# Patient Record
Sex: Female | Born: 1970 | Race: Black or African American | Hispanic: No | Marital: Single | Smoking: Current every day smoker
Health system: Southern US, Community
[De-identification: ages and names within clinical notes are randomized; demographics above are authoritative.]

## PROBLEM LIST (undated history)

## (undated) DIAGNOSIS — Q760 Spina bifida occulta: Secondary | ICD-10-CM

## (undated) DIAGNOSIS — K2970 Gastritis, unspecified, without bleeding: Secondary | ICD-10-CM

## (undated) DIAGNOSIS — K589 Irritable bowel syndrome without diarrhea: Secondary | ICD-10-CM

## (undated) DIAGNOSIS — G43909 Migraine, unspecified, not intractable, without status migrainosus: Secondary | ICD-10-CM

## (undated) DIAGNOSIS — B9681 Helicobacter pylori [H. pylori] as the cause of diseases classified elsewhere: Secondary | ICD-10-CM

## (undated) DIAGNOSIS — Z9889 Other specified postprocedural states: Secondary | ICD-10-CM

## (undated) DIAGNOSIS — K219 Gastro-esophageal reflux disease without esophagitis: Secondary | ICD-10-CM

## (undated) HISTORY — DX: Spina bifida occulta: Q76.0

## (undated) HISTORY — DX: Migraine, unspecified, not intractable, without status migrainosus: G43.909

## (undated) HISTORY — PX: ENDOMETRIAL ABLATION: SHX621

## (undated) HISTORY — DX: Gastritis, unspecified, without bleeding: K29.70

## (undated) HISTORY — PX: DILATION AND CURETTAGE OF UTERUS: SHX78

## (undated) HISTORY — PX: TUBAL LIGATION: SHX77

## (undated) HISTORY — PX: HEMORRHOID BANDING: SHX5850

## (undated) HISTORY — DX: Irritable bowel syndrome, unspecified: K58.9

## (undated) HISTORY — DX: Other specified postprocedural states: Z98.890

## (undated) HISTORY — DX: Helicobacter pylori (H. pylori) as the cause of diseases classified elsewhere: B96.81

## (undated) HISTORY — DX: Gastro-esophageal reflux disease without esophagitis: K21.9

## (undated) HISTORY — PX: CHOLECYSTECTOMY: SHX55

---

## 2001-05-25 ENCOUNTER — Encounter: Payer: Self-pay | Admitting: Family Medicine

## 2001-05-25 ENCOUNTER — Ambulatory Visit (HOSPITAL_COMMUNITY): Admission: RE | Admit: 2001-05-25 | Discharge: 2001-05-25 | Payer: Self-pay | Admitting: Family Medicine

## 2001-06-02 ENCOUNTER — Other Ambulatory Visit: Admission: RE | Admit: 2001-06-02 | Discharge: 2001-06-02 | Payer: Self-pay | Admitting: Obstetrics and Gynecology

## 2001-06-12 ENCOUNTER — Emergency Department (HOSPITAL_COMMUNITY): Admission: EM | Admit: 2001-06-12 | Discharge: 2001-06-12 | Payer: Self-pay | Admitting: Emergency Medicine

## 2001-10-16 ENCOUNTER — Emergency Department (HOSPITAL_COMMUNITY): Admission: EM | Admit: 2001-10-16 | Discharge: 2001-10-16 | Payer: Self-pay | Admitting: Emergency Medicine

## 2002-02-06 ENCOUNTER — Emergency Department (HOSPITAL_COMMUNITY): Admission: EM | Admit: 2002-02-06 | Discharge: 2002-02-06 | Payer: Self-pay | Admitting: Emergency Medicine

## 2002-03-23 ENCOUNTER — Emergency Department (HOSPITAL_COMMUNITY): Admission: EM | Admit: 2002-03-23 | Discharge: 2002-03-23 | Payer: Self-pay | Admitting: Emergency Medicine

## 2002-10-11 ENCOUNTER — Emergency Department (HOSPITAL_COMMUNITY): Admission: EM | Admit: 2002-10-11 | Discharge: 2002-10-11 | Payer: Self-pay | Admitting: Emergency Medicine

## 2003-02-10 ENCOUNTER — Emergency Department (HOSPITAL_COMMUNITY): Admission: EM | Admit: 2003-02-10 | Discharge: 2003-02-10 | Payer: Self-pay | Admitting: Internal Medicine

## 2003-02-15 ENCOUNTER — Emergency Department (HOSPITAL_COMMUNITY): Admission: EM | Admit: 2003-02-15 | Discharge: 2003-02-16 | Payer: Self-pay | Admitting: *Deleted

## 2003-02-16 ENCOUNTER — Encounter: Payer: Self-pay | Admitting: *Deleted

## 2005-03-18 ENCOUNTER — Emergency Department (HOSPITAL_COMMUNITY): Admission: EM | Admit: 2005-03-18 | Discharge: 2005-03-18 | Payer: Self-pay | Admitting: Emergency Medicine

## 2005-12-02 ENCOUNTER — Ambulatory Visit (HOSPITAL_COMMUNITY): Admission: RE | Admit: 2005-12-02 | Discharge: 2005-12-02 | Payer: Self-pay | Admitting: General Surgery

## 2006-01-07 ENCOUNTER — Emergency Department (HOSPITAL_COMMUNITY): Admission: EM | Admit: 2006-01-07 | Discharge: 2006-01-07 | Payer: Self-pay | Admitting: Emergency Medicine

## 2006-06-09 ENCOUNTER — Emergency Department (HOSPITAL_COMMUNITY): Admission: EM | Admit: 2006-06-09 | Discharge: 2006-06-09 | Payer: Self-pay | Admitting: Emergency Medicine

## 2006-08-03 ENCOUNTER — Emergency Department (HOSPITAL_COMMUNITY): Admission: EM | Admit: 2006-08-03 | Discharge: 2006-08-03 | Payer: Self-pay | Admitting: Emergency Medicine

## 2006-10-14 ENCOUNTER — Emergency Department (HOSPITAL_COMMUNITY): Admission: EM | Admit: 2006-10-14 | Discharge: 2006-10-15 | Payer: Self-pay | Admitting: Emergency Medicine

## 2007-01-18 ENCOUNTER — Ambulatory Visit (HOSPITAL_COMMUNITY): Admission: RE | Admit: 2007-01-18 | Discharge: 2007-01-18 | Payer: Self-pay | Admitting: Obstetrics & Gynecology

## 2007-01-18 ENCOUNTER — Encounter: Payer: Self-pay | Admitting: Obstetrics & Gynecology

## 2007-02-21 ENCOUNTER — Emergency Department (HOSPITAL_COMMUNITY): Admission: EM | Admit: 2007-02-21 | Discharge: 2007-02-21 | Payer: Self-pay | Admitting: Emergency Medicine

## 2007-05-12 ENCOUNTER — Emergency Department (HOSPITAL_COMMUNITY): Admission: EM | Admit: 2007-05-12 | Discharge: 2007-05-12 | Payer: Self-pay | Admitting: Emergency Medicine

## 2009-03-15 ENCOUNTER — Emergency Department (HOSPITAL_COMMUNITY): Admission: EM | Admit: 2009-03-15 | Discharge: 2009-03-15 | Payer: Self-pay | Admitting: Emergency Medicine

## 2009-03-25 ENCOUNTER — Emergency Department (HOSPITAL_COMMUNITY): Admission: EM | Admit: 2009-03-25 | Discharge: 2009-03-25 | Payer: Self-pay | Admitting: Emergency Medicine

## 2009-07-04 ENCOUNTER — Emergency Department (HOSPITAL_COMMUNITY): Admission: EM | Admit: 2009-07-04 | Discharge: 2009-07-04 | Payer: Self-pay | Admitting: Emergency Medicine

## 2009-07-21 ENCOUNTER — Other Ambulatory Visit: Admission: RE | Admit: 2009-07-21 | Discharge: 2009-07-21 | Payer: Self-pay | Admitting: Obstetrics & Gynecology

## 2009-07-22 ENCOUNTER — Emergency Department (HOSPITAL_COMMUNITY): Admission: EM | Admit: 2009-07-22 | Discharge: 2009-07-22 | Payer: Self-pay | Admitting: Emergency Medicine

## 2009-08-02 DIAGNOSIS — B9681 Helicobacter pylori [H. pylori] as the cause of diseases classified elsewhere: Secondary | ICD-10-CM

## 2009-08-02 HISTORY — DX: Helicobacter pylori (H. pylori) as the cause of diseases classified elsewhere: B96.81

## 2009-09-10 ENCOUNTER — Ambulatory Visit: Payer: Self-pay | Admitting: Internal Medicine

## 2009-09-10 DIAGNOSIS — R1013 Epigastric pain: Secondary | ICD-10-CM | POA: Insufficient documentation

## 2009-09-10 DIAGNOSIS — K219 Gastro-esophageal reflux disease without esophagitis: Secondary | ICD-10-CM

## 2009-09-10 DIAGNOSIS — K625 Hemorrhage of anus and rectum: Secondary | ICD-10-CM

## 2009-09-10 DIAGNOSIS — Z91038 Other insect allergy status: Secondary | ICD-10-CM | POA: Insufficient documentation

## 2009-09-10 DIAGNOSIS — Z862 Personal history of diseases of the blood and blood-forming organs and certain disorders involving the immune mechanism: Secondary | ICD-10-CM

## 2009-09-10 DIAGNOSIS — K5900 Constipation, unspecified: Secondary | ICD-10-CM | POA: Insufficient documentation

## 2009-09-30 DIAGNOSIS — Z9889 Other specified postprocedural states: Secondary | ICD-10-CM

## 2009-09-30 HISTORY — PX: OTHER SURGICAL HISTORY: SHX169

## 2009-09-30 HISTORY — DX: Other specified postprocedural states: Z98.890

## 2009-10-06 ENCOUNTER — Encounter: Payer: Self-pay | Admitting: Urgent Care

## 2009-10-07 ENCOUNTER — Ambulatory Visit (HOSPITAL_COMMUNITY): Admission: RE | Admit: 2009-10-07 | Discharge: 2009-10-07 | Payer: Self-pay | Admitting: Internal Medicine

## 2009-10-07 ENCOUNTER — Ambulatory Visit: Payer: Self-pay | Admitting: Internal Medicine

## 2009-10-10 ENCOUNTER — Encounter: Payer: Self-pay | Admitting: Gastroenterology

## 2009-10-13 ENCOUNTER — Encounter (INDEPENDENT_AMBULATORY_CARE_PROVIDER_SITE_OTHER): Payer: Self-pay

## 2009-10-30 ENCOUNTER — Telehealth (INDEPENDENT_AMBULATORY_CARE_PROVIDER_SITE_OTHER): Payer: Self-pay

## 2009-12-15 ENCOUNTER — Telehealth (INDEPENDENT_AMBULATORY_CARE_PROVIDER_SITE_OTHER): Payer: Self-pay

## 2010-03-05 ENCOUNTER — Emergency Department (HOSPITAL_COMMUNITY): Admission: EM | Admit: 2010-03-05 | Discharge: 2010-03-05 | Payer: Self-pay | Admitting: Emergency Medicine

## 2010-09-01 NOTE — Letter (Signed)
Summary: TCS/EGD ORDER  TCS/EGD ORDER   Imported By: Ave Filter 09/10/2009 11:39:38  _____________________________________________________________________  External Attachment:    Type:   Image     Comment:   External Document

## 2010-09-01 NOTE — Medication Information (Signed)
Summary: Tax adviser   Imported By: Diana Eves 10/10/2009 14:12:03  _____________________________________________________________________  External Attachment:    Type:   Image     Comment:   External Document  Appended Document: RX Folder omeprazole  HAS TO TRY OMEPRAZOLE FIRST  Prescriptions: OMEPRAZOLE 20 MG CPDR (OMEPRAZOLE) one by mouth 30 mins before breakfast daily  #30 x 11   Entered and Authorized by:   Leanna Battles. Dixon Boos   Signed by:   Leanna Battles Amarie Tarte PA-C on 10/10/2009   Method used:   Electronically to        [facility]. [phone]* (retail)       [address] [place]  [address]       Ph: [phone]       Fax: [phone]   RxID:   [id number]

## 2010-09-01 NOTE — Medication Information (Signed)
Summary: Tax adviser   Imported By: Diana Eves 10/06/2009 16:47:49  _____________________________________________________________________  External Attachment:    Type:   Image     Comment:   External Document  Appended Document: RX Folder Please find out why pt needs a RF on this.  It is for TCS prep.  Did she already use once?  Appended Document: RX Folder Called pt and # has been temporarily disconnected. ( She had rescheduled her TCS til today )  Appended Document: RX Folder DId she have done or does she need RF?  Appended Document: RX Tree surgeon @ [facility]. They said it was possible that pt keyed in incorrect number when she requested refills. They gave me new phone number of [phone] to call pt. Spoke with pt, she said she had her procedure done yesterday. Said she did not need any more of the tablets requested, accidentally requested wrong prescription.  ( She said OK to put new phone number in her information.)

## 2010-09-01 NOTE — Progress Notes (Signed)
Summary: hemorrhoids  Phone Note Call from Patient   Caller: Patient Summary of Call: Pt came by the office c/o of hemorrhoids and some bleeding with them. I asked her did she ever get the $14.00 to get the Anusol and she said she could get it from someone. I called the pharmacist, Thurston Pounds, at [facility] and he said Rx was on  profile and he will get it ready for her. I also reminded her to take the Miralax as needed for constipaton.  Initial call taken by: Cloria Spring LPN,  Dec 15, 2009 10:59 AM

## 2010-09-01 NOTE — Assessment & Plan Note (Signed)
Summary: NPP/RECTAL BLEEDING.GU   Visit Type:  Consult Primary Care Provider:  [place]  Chief Complaint:  rectal bleeding, abd pain, and diarrhea.  History of Present Illness: [name] is a pleasant 40 y/o female, patient of Dr. Felecia Shelling, who presents for further evaluation constipation, rectal bleeding, abdominal pain. She is a difficult historian. She complains of several month h/o abdominal cramping with and without meals. Recently seen in ED (12/10) for rectal bleeding associated with constipation. She had blood noted on the glove with DRE. She is taking Miralax one to two capfuls two times a day, but c/o BM in middle of night. Stools are soft but she still has to strain. Denies vomiting but c/o nausea. She c/o chronic heartburn and used various OTC antacids at times. Denies wt loss, difficulty swallowing. She c/o chronic IDA and has been on iron pills for years. Her last Hgb in 07/04/10 was 13.8.  She takes migraine medications with ASA 325mg  up to four per day.        Current Medications (verified): 1)  Butalbital-Aspirin-Caffeine 50-325-40 Mg Tabs (Butalbital-Aspirin-Caffeine) .... One Every 6 Hours As Needed Headache 2)  Re Dualvit F 162-115.2-1 Mg Caps (Ferrous Fum-Iron Polysacch-Fa) .... Once Daily 3)  Hyoscyamine Sulfate 0.125 Mg Subl (Hyoscyamine Sulfate) .... Two Times A Day 4)  Miralax  Powd (Polyethylene Glycol 3350) .... 2 Scoops Two Times A Day 5)  Proctozone-Hc 2.5 % Crea (Hydrocortisone) .... Two Times A Day  Allergies (verified): 1)  ! Codeine 2)  ! Tylenol 3)  * Bee Sting  Past History:  Past Medical History: Migraine H/A Chronic GERD ?spina bifida? but no obvious physical limitations  Past Surgical History: Endometrial ablation D+C Cholecystectomy Tubal Ligation  Family History: Mat Grandmother, colon cancer Mother, healthy Father, history unknown  Social History: Single. One daughter, age 11. Disabled, spinal issues ?spina bifida?  One ppd. No  alcohol, drugs. Graduated high school. Able to read. Denies learning disabilities.  Review of Systems General:  Denies fever, chills, sweats, anorexia, weakness, and weight loss. Eyes:  Denies vision loss. ENT:  Denies nasal congestion, hoarseness, and difficulty swallowing. CV:  Denies chest pains, angina, palpitations, dyspnea on exertion, and peripheral edema. Resp:  Denies dyspnea at rest, dyspnea with exercise, and cough. GI:  See HPI. GU:  Denies urinary burning and blood in urine. MS:  Complains of low back pain; denies joint pain / LOM. Derm:  Denies rash and itching. Neuro:  Complains of frequent headaches; denies weakness, paralysis, memory loss, and confusion. Psych:  Denies depression and anxiety. Endo:  Denies unusual weight change. Heme:  Denies bruising and bleeding. Allergy:  Denies hives and rash.  Vital Signs:  Patient profile:   40 year old female Height:      64 inches Weight:      160 pounds BMI:     27.56 Temp:     97.8 degrees F oral Pulse rate:   60 / minute BP sitting:   124 / 80  (left arm) Cuff size:   regular  Vitals Entered By: Hendricks Limes LPN (September 10, 2009 10:52 AM)  Physical Exam  General:  Well developed, well nourished, no acute distress. Head:  Normocephalic and atraumatic. Eyes:  Conjunctivae pink, no scleral icterus.  Mouth:  Oropharyngeal mucosa moist, pink.  No lesions, erythema or exudate.    Neck:  Supple; no masses or thyromegaly. Lungs:  Clear throughout to auscultation. Heart:  Regular rate and rhythm; no murmurs, rubs,  or bruits.  Abdomen:  Bowel sounds normal.  Abdomen is soft, mild epigastric tenderness, nondistended.  No rebound or guarding.  No hepatosplenomegaly, masses or hernias.  No abdominal bruits.  Rectal:  deferred until time of colonoscopy.   Extremities:  No clubbing, cyanosis, edema or deformities noted. Neurologic:  Alert and  oriented x4;  grossly normal neurologically. Skin:  Intact without significant  lesions or rashes. Psych:  Alert and cooperative. Normal mood and affect.  Impression & Recommendations:  Problem # 1:  RECTAL BLEEDING (ICD-569.3)  Recent episode of rectal bleeding, self-limited, in the setting of constipation. Likely due to benign anorectal source. Bowel movements improved on Miralax and actually probably too frequent as patient c/o nocturnal bms. Will decrease Miralax to one scoop once to twice daily. Recommend TCS. Colonoscopy to be performed in near future.  Risks, alternatives, and benefits including but not limited to the risk of reaction to medication, bleeding, infection, and perforation were addressed.  Patient voiced understanding and provided verbal consent.   Orders: Consultation Level III ([address])  Problem # 2:  GERD (ICD-530.81)  Chronic GERD, nausea, epigastric pain in setting of regular ASA use. EGD to be performed in near future.  Risks, alternatives, benefits including but not limited to risk of reaction to medications, bleeding, infection, and perforation addressed.  Patient voiced understanding and verbal consent obtained. She will hold her migraine medication for four days before procedure, usually takes equivalent of ASA 325mg  QID.   Orders: Consultation Level III ([address])  Problem # 3:  ANEMIA, IRON DEFICIENCY, HX OF (ICD-V12.3)  She gives h/o IDA and is on chronic IDA. Recent H/H normal in ED. Colonoscopy/EGD to be performed in near future.  Risks, alternatives, and benefits including but not limited to the risk of reaction to medication, bleeding, infection, and perforation were addressed.  Patient voiced understanding and provided verbal consent.   Orders: Consultation Level III [phone]) I would like to thank Dr. Felecia Shelling for allowing [place] to take part in the care of this nice patient.

## 2010-09-01 NOTE — Progress Notes (Signed)
Summary: hemorroids  Phone Note Call from Patient Call back at [phone]   Caller: Patient Summary of Call: pt came by office. Medicaid wont pay for anusol. Rx costs 14.00 and pt doesnt have the money. wants to know if there is anything else she can do for her hemorroids. please advise Initial call taken by: Hendricks Limes LPN,  October 30, 2009 4:18 PM     Appended Document: hemorroids preparation H; sitz baths; please send her literature  Appended Document: hemorroids tried to call pt- she was not home, left message to call back

## 2010-09-01 NOTE — Letter (Signed)
Summary: Plan of Care, Need to Discuss  [facility]  [address]   [place], [place] [address]   Phone: [phone]  Fax: [phone]    October 13, 2009  Alyssa Aguilar [address] [place], [place]  [address] 1971/03/23   Dear Ms. Hyams,   We are writing this letter to inform you of treatment plans and/or discuss your plan of care.  We have tried several times to contact you; however, we have yet to reach you.  We ask that you please contact our office for follow-up on your gastrointestinal issues.  We can  be reached at [phone] to schedule an appointment, or to speak with someone regarding your health care needs.  Please do not neglect your health.   Sincerely,    Hendricks Limes LPN  [facility] Ph: [phone]    Fax: [phone]

## 2010-11-03 LAB — URINE CULTURE: Colony Count: >=100000

## 2010-11-03 LAB — URINALYSIS, ROUTINE W REFLEX MICROSCOPIC
Bilirubin Urine: NEGATIVE
Glucose, UA: NEGATIVE mg/dL
Ketones, ur: NEGATIVE mg/dL
Leukocytes, UA: NEGATIVE
Nitrite: NEGATIVE
Protein, ur: NEGATIVE mg/dL
Specific Gravity, Urine: 1.025 (ref 1.005–1.030)
Urobilinogen, UA: 0.2 mg/dL (ref 0.0–1.0)
pH: 6.5 (ref 5.0–8.0)

## 2010-11-03 LAB — BASIC METABOLIC PANEL
Calcium: 8.6 mg/dL (ref 8.4–10.5)
GFR calc Af Amer: >60 mL/min (ref >60)
GFR calc non Af Amer: >60 mL/min (ref >60)
Potassium: 3.4 mEq/L — ABNORMAL LOW (ref 3.5–5.1)
Sodium: 141 mEq/L (ref 135–145)

## 2010-11-03 LAB — DIFFERENTIAL
Basophils Absolute: 0 10*3/uL (ref 0.0–0.1)
Eosinophils Relative: 4 % (ref 0–5)
Lymphocytes Relative: 44 % (ref 12–46)
Lymphs Abs: 1.8 10*3/uL (ref 0.7–4.0)
Monocytes Absolute: 0.3 10*3/uL (ref 0.1–1.0)
Neutro Abs: 1.9 10*3/uL (ref 1.7–7.7)

## 2010-11-03 LAB — GC/CHLAMYDIA PROBE AMP, GENITAL
Chlamydia, DNA Probe: NEGATIVE
GC Probe Amp, Genital: NEGATIVE

## 2010-11-03 LAB — URINE MICROSCOPIC-ADD ON

## 2010-11-03 LAB — WET PREP, GENITAL
Trich, Wet Prep: NONE SEEN
Yeast Wet Prep HPF POC: NONE SEEN

## 2010-11-03 LAB — CBC
HCT: 40.9 % (ref 36.0–46.0)
Hemoglobin: 13.8 g/dL (ref 12.0–15.0)
WBC: 4.1 10*3/uL (ref 4.0–10.5)

## 2010-11-03 LAB — PREGNANCY, URINE: Preg Test, Ur: NEGATIVE

## 2010-11-04 ENCOUNTER — Other Ambulatory Visit: Payer: Self-pay | Admitting: Gastroenterology

## 2010-11-05 ENCOUNTER — Other Ambulatory Visit: Payer: Self-pay

## 2010-11-05 NOTE — Telephone Encounter (Signed)
I mailed letter to pt with appointment card of her OV in Oct.

## 2010-11-05 NOTE — Telephone Encounter (Signed)
Needs OV in next six months.

## 2010-12-09 ENCOUNTER — Ambulatory Visit (INDEPENDENT_AMBULATORY_CARE_PROVIDER_SITE_OTHER): Payer: Self-pay | Admitting: Gastroenterology

## 2010-12-09 ENCOUNTER — Encounter: Payer: Self-pay | Admitting: Gastroenterology

## 2010-12-09 VITALS — BP 101/66 | HR 63 | Temp 97.3°F | Ht 64.0 in | Wt 155.8 lb

## 2010-12-09 DIAGNOSIS — K649 Unspecified hemorrhoids: Secondary | ICD-10-CM

## 2010-12-09 DIAGNOSIS — K219 Gastro-esophageal reflux disease without esophagitis: Secondary | ICD-10-CM

## 2010-12-09 DIAGNOSIS — K589 Irritable bowel syndrome without diarrhea: Secondary | ICD-10-CM

## 2010-12-09 MED ORDER — HYDROCORTISONE ACETATE 25 MG RE SUPP: 25.0000 mg | Freq: Two times a day (BID) | RECTAL | Status: AC

## 2010-12-09 MED ORDER — DOCUSATE SODIUM 100 MG PO CAPS: 100.0000 mg | ORAL_CAPSULE | Freq: Two times a day (BID) | ORAL | Status: AC

## 2010-12-09 NOTE — Assessment & Plan Note (Signed)
Well controlled 

## 2010-12-09 NOTE — Assessment & Plan Note (Signed)
Hemorrhoidal flare. Stool softener daily, high fiber diet. Hopefully can regulate her bowels this way. Anusol for 2 weeks. Office visit as needed.

## 2010-12-09 NOTE — Progress Notes (Signed)
Primary Care Physician: Avon Gully, MD  Primary Gastroenterologist:  Dr. Roetta Sessions  Chief Complaint  Patient presents with  . Hemorrhoids    bleeding and hurting    HPI: Alyssa Aguilar is a 40 y.o. female here for f/u. BMs hard to loose. Some intermittent brbpr. Hemorrhoid pain/itch. Cannot take hot bath, no bathtub. Some lower abdominal discomfort and increased BMs with stress. If not around boyfriend, then no abdominal pain. No heartburn, okay right now. Appetite good. No n/v.    Current Outpatient Prescriptions  Medication Sig Dispense Refill  . hyoscyamine (LEVSIN, ANASPAZ) 0.125 MG tablet Take 0.125 mg by mouth every 4 (four) hours as needed.        . naproxen (NAPROSYN) 250 MG tablet Take 250 mg by mouth 2 (two) times daily with a meal.        . omeprazole (PRILOSEC) 20 MG capsule TAKE 1 CAPSULE BY MOUTH 30 MINUTES BEFORE BREAKFAST DAILY  30 capsule  5  . docusate sodium (COLACE) 100 MG capsule Take 1 capsule (100 mg total) by mouth 2 (two) times daily.  60 capsule  0  . hydrocortisone (ANUSOL-HC) 25 MG suppository Place 1 suppository (25 mg total) rectally 2 (two) times daily.  28 suppository  0    Allergies as of 12/09/2010 - Review Complete 12/09/2010  Allergen Reaction Noted  . Acetaminophen    . Codeine      ROS:  General: Negative for anorexia, weight loss, fever, chills, fatigue, weakness. ENT: Negative for hoarseness, difficulty swallowing , nasal congestion. CV: Negative for chest pain, angina, palpitations, dyspnea on exertion, peripheral edema.  Respiratory: Negative for dyspnea at rest, dyspnea on exertion, cough, sputum, wheezing.  GI: See history of present illness. GU:  Negative for dysuria, hematuria, urinary incontinence, urinary frequency, nocturnal urination.  Endo: Negative for unusual weight change.    Physical Examination:   BP 101/66  Pulse 63  Temp(Src) 97.3 F (36.3 C) (Tympanic)  Ht 5\' 4"  (1.626 m)  Wt 155 lb 12.8 oz (70.67 kg)  BMI  26.74 kg/m2  General: Well-nourished, well-developed in no acute distress.  Eyes: No icterus. Mouth: Oropharyngeal mucosa moist and pink , no lesions erythema or exudate. Lungs: Clear to auscultation bilaterally.  Heart: Regular rate and rhythm, no murmurs rubs or gallops.  Abdomen: Bowel sounds are normal, nontender, nondistended, no hepatosplenomegaly or masses, no abdominal bruits or hernia , no rebound or guarding.   Rectal: Small ext hemorrhoid, nonthrombosed, nonbleeding. Painless DRE. No masses. No blood noted on glove. Stool brown. Extremities: No lower extremity edema.  Neuro: Alert and oriented x 4   Skin: Warm and dry, no jaundice.   Psych: Alert and cooperative, normal mood and affect.

## 2010-12-09 NOTE — Assessment & Plan Note (Signed)
Intermittent abdominal cramps brought on by stress and anxiety related to relationship with her boyfriend. Relieved with bowel movement. She uses Levsin as needed. She is trying to resolve the situation with her boyfriend. High fiber diet.

## 2010-12-09 NOTE — Patient Instructions (Signed)
Consume high fiber diet. Please see handout. Call with any further problems.

## 2010-12-10 NOTE — Progress Notes (Signed)
Cc to PCP 

## 2010-12-15 NOTE — Op Note (Signed)
[name], Alyssa                ACCOUNT NO.:  [id number]   MEDICAL RECORD NO.:  [id number]          PATIENT TYPE:  AMB   LOCATION:  DAY                           FACILITY:  [facility]   PHYSICIAN:  Lazaro Arms, M.D.   DATE OF BIRTH:  Feb 11, 1971   DATE OF PROCEDURE:  01/18/2007  DATE OF DISCHARGE:                               OPERATIVE REPORT   PREOPERATIVE DIAGNOSES:  1. Menometrorrhagia.  2. Dysmenorrhea.   POSTOPERATIVE DIAGNOSES:  1. Menometrorrhagia.  2. Dysmenorrhea.   PROCEDURE:  Hysteroscopy, dilation and curettage, endometrial ablation.   SURGEON:  Lazaro Arms, M.D.   ANESTHESIA:  General endotracheal.   FINDINGS:  The patient had normal endometrium.  No submucosal polyps or  myomas or other abnormalities.   OPERATION:  The patient was taken to the operating room, placed supine  position, underwent general endotracheal anesthesia.  Placed in dorsal  supine position, prepped and draped in usual sterile fashion.  Graves  speculum was placed.  The cervix was grasped, it was dilated serially to  allow passage of the hysteroscope.  Hysteroscopy was performed and was  found to be completely normal.  A vigorous uterine curettage was  performed, good uterine cry was obtained in all areas.  The ThermaChoice  III endometrial ablation balloon was then used.  18 mL of D5W was  required to maintain a pressure of 190 to 200 mmHg throughout the  procedure.  It was heated to 87 degrees Celsius. total therapy time of 9  minutes and 39 seconds.  All the fluid was recovered end of the  procedure.  The patient tolerated procedure well.  She experienced some  minimal blood loss, taken recovery room in good stable condition.  All  counts correct.  She received Ancef prophylactically.      Lazaro Arms, M.D.  Electronically Signed     LHE/MEDQ  D:  01/18/2007  T:  01/18/2007  Job:  [id number]

## 2010-12-15 NOTE — H&P (Signed)
NAME:  Alyssa Aguilar, Alyssa Aguilar                ACCOUNT NO.:  [id number]   MEDICAL RECORD NO.:  [id number]          PATIENT TYPE:  AMB   LOCATION:  DAY                           FACILITY:  [facility]   PHYSICIAN:  Lazaro Arms, M.D.   DATE OF BIRTH:  01/11/1971   DATE OF ADMISSION:  DATE OF DISCHARGE:  LH                              HISTORY & PHYSICAL   Alyssa Aguilar is a 40 year old female, gravida 2, para 1, abortus 1, status post  tubal ligation in 1997, who was seen for her yearly exam back on May  22nd and complaining of very painful, very heavy menstrual cycles for  the past several years.  She bleeds for approximately five days at a  time, heavy clotting and heavy cramping, essentially debilitating.  I  did an ultrasound in the office, and it reveals an endometrial stripe,  normal uterus.  She has secondary dysmenorrhea, menometrorrhagia, and  lots of clotting as a result, with a normal ultrasound.  She has no  dyspareunia and no pelvic pain other than with her period.  This is  actually documented in our office chart, dating all the way back to  2002.  As a result, she is admitted for hysteroscopy, D&C, and  endometrial ablation.   PAST MEDICAL HISTORY:  Negative.   PAST SURGICAL HISTORY:  Tubal ligation.  She also had a cholecystectomy  in the past.   PAST OB HISTORY:  Vaginal delivery.   She is on no current medications.   REVIEW OF SYSTEMS:  Otherwise negative.   PHYSICAL EXAMINATION:  VITAL SIGNS:  Her weight is 154 pounds.  Blood  pressure 95/60.  HEENT:  Unremarkable.  NECK:  Thyroid is normal.  LUNGS:  Clear.  HEART:  Regular rate and rhythm without murmurs, rubs or gallops.  BREASTS:  Without mass, discharge, or skin changes.  ABDOMEN:  Benign.  No hepatosplenomegaly or masses.  GENITOURINARY:  Normal external genitalia.  The vagina is pink and moist  without discharge.  Cervix is parous without lesions.  Her Pap smear was  normal.  Uterus is normal size, shape, and contour.   Ovaries are normal  and nontender.   IMPRESSION:  1. Menometrorrhagia.  2. Dysmenorrhea.   PLAN:  Patient is admitted for hysteroscopy, D&C, and endometrial  ablation.  She understands the risks, benefits, indications, and  alternatives and will proceed.      Lazaro Arms, M.D.  Electronically Signed     LHE/MEDQ  D:  01/17/2007  T:  01/17/2007  Job:  [id number]

## 2010-12-18 NOTE — H&P (Signed)
Alyssa Aguilar, Alyssa Aguilar                ACCOUNT NO.:  [id number]   MEDICAL RECORD NO.:  [id number]          PATIENT TYPE:  AMB   LOCATION:  DAY                           FACILITY:  [facility]   PHYSICIAN:  Jerolyn Shin C. Katrinka Blazing, M.D.   DATE OF BIRTH:  1971-06-22   DATE OF ADMISSION:  12/01/2005  DATE OF DISCHARGE:  LH                                HISTORY & PHYSICAL   HISTORY OF PRESENT ILLNESS:  A 40 year old female with a history of  recurrent abdominal pain. The pain is mostly in the epigastrium. It is  associated with nausea and vomiting. She has not had difficulty with bowel  movements. She has a chronic anemia. She is not on non-steroidals. The  patient is scheduled for an upper endoscopy.   PAST MEDICAL HISTORY:  Positive for congenital mental retardation and  irritable bowel syndrome.   MEDICATIONS:  Levsin 0.125 mg a.c. and h.s.   PAST SURGICAL HISTORY:  Cholecystectomy, tubal ligation.   ALLERGIES:  ASPIRIN.   REVIEW OF SYSTEMS:  Positive for dysfunctional uterine bleeding.   PHYSICAL EXAMINATION:  VITAL SIGNS:  Blood pressure 120/78, pulse 68,  respiratory rate 20, weight 156 pounds.  HEENT:  Unremarkable.  NECK:  Supple. No JVD, bruit, adenopathy, or thyromegaly.  CHEST:  Clear to auscultation.  HEART:  Regular rate and rhythm. Without murmur, rub, or gallop.  ABDOMEN:  Soft, nontender. No masses.  EXTREMITIES:  No clubbing, cyanosis, or edema.  NEUROLOGIC:  No focal, motor, sensory, or cerebellar deficits.   IMPRESSION:  1.  Chronic dyspepsia, rule out peptic ulcer disease.  2.  Irritable bowel syndrome.  3.  Chronic anemia.   PLAN:  Upper endoscopy.      Dirk Dress. Katrinka Blazing, M.D.  Electronically Signed     LCS/MEDQ  D:  12/01/2005  T:  12/01/2005  Job:  [id number]

## 2011-01-30 ENCOUNTER — Emergency Department (HOSPITAL_COMMUNITY)
Admission: EM | Admit: 2011-01-30 | Discharge: 2011-01-31 | Disposition: A | Discharge status: Home / Self Care | Payer: Medicaid Other | Attending: Emergency Medicine | Admitting: Emergency Medicine

## 2011-01-30 DIAGNOSIS — R51 Headache: Secondary | ICD-10-CM | POA: Insufficient documentation

## 2011-01-30 DIAGNOSIS — Z79899 Other long term (current) drug therapy: Secondary | ICD-10-CM | POA: Insufficient documentation

## 2011-01-30 DIAGNOSIS — R110 Nausea: Secondary | ICD-10-CM | POA: Insufficient documentation

## 2011-01-30 DIAGNOSIS — X30XXXA Exposure to excessive natural heat, initial encounter: Secondary | ICD-10-CM | POA: Insufficient documentation

## 2011-01-30 DIAGNOSIS — R42 Dizziness and giddiness: Secondary | ICD-10-CM | POA: Insufficient documentation

## 2011-01-30 DIAGNOSIS — F17200 Nicotine dependence, unspecified, uncomplicated: Secondary | ICD-10-CM | POA: Insufficient documentation

## 2011-01-30 DIAGNOSIS — Y93H2 Activity, gardening and landscaping: Secondary | ICD-10-CM | POA: Insufficient documentation

## 2011-01-30 DIAGNOSIS — T675XXA Heat exhaustion, unspecified, initial encounter: Secondary | ICD-10-CM | POA: Insufficient documentation

## 2011-01-30 LAB — DIFFERENTIAL
Basophils Absolute: 0 10*3/uL (ref 0.0–0.1)
Eosinophils Relative: 6 % — ABNORMAL HIGH (ref 0–5)
Lymphocytes Relative: 51 % — ABNORMAL HIGH (ref 12–46)
Lymphs Abs: 2.9 10*3/uL (ref 0.7–4.0)
Neutro Abs: 1.9 10*3/uL (ref 1.7–7.7)

## 2011-01-30 LAB — BASIC METABOLIC PANEL
BUN: 12 mg/dL (ref 6–23)
CO2: 26 mEq/L (ref 19–32)
Chloride: 102 mEq/L (ref 96–112)
GFR calc non Af Amer: >60 mL/min (ref >60)
Glucose, Bld: 127 mg/dL — ABNORMAL HIGH (ref 70–99)
Potassium: 3 mEq/L — ABNORMAL LOW (ref 3.5–5.1)
Sodium: 137 mEq/L (ref 135–145)

## 2011-01-30 LAB — CBC
HCT: 40.6 % (ref 36.0–46.0)
Hemoglobin: 13.6 g/dL (ref 12.0–15.0)
MCV: 91 fL (ref 78.0–100.0)
RBC: 4.46 MIL/uL (ref 3.87–5.11)
WBC: 5.6 10*3/uL (ref 4.0–10.5)

## 2011-01-30 LAB — URINALYSIS, ROUTINE W REFLEX MICROSCOPIC
Glucose, UA: NEGATIVE mg/dL
Hgb urine dipstick: NEGATIVE
Leukocytes, UA: NEGATIVE
Protein, ur: NEGATIVE mg/dL
Specific Gravity, Urine: >1.03 — ABNORMAL HIGH (ref 1.005–1.030)
pH: 5.5 (ref 5.0–8.0)

## 2011-02-23 ENCOUNTER — Encounter: Payer: Self-pay | Admitting: Internal Medicine

## 2011-04-15 ENCOUNTER — Ambulatory Visit (INDEPENDENT_AMBULATORY_CARE_PROVIDER_SITE_OTHER): Payer: Medicaid Other | Admitting: Gastroenterology

## 2011-04-15 ENCOUNTER — Encounter: Payer: Self-pay | Admitting: Gastroenterology

## 2011-04-15 DIAGNOSIS — K649 Unspecified hemorrhoids: Secondary | ICD-10-CM

## 2011-04-15 DIAGNOSIS — K589 Irritable bowel syndrome without diarrhea: Secondary | ICD-10-CM

## 2011-04-15 MED ORDER — POLYETHYLENE GLYCOL 3350 17 GM/SCOOP PO POWD: 17.0000 g | Freq: Every day | ORAL | Status: AC

## 2011-04-15 MED ORDER — HYDROCORTISONE ACETATE 25 MG RE SUPP: 25.0000 mg | Freq: Two times a day (BID) | RECTAL | Status: DC

## 2011-04-15 NOTE — Progress Notes (Signed)
Referring Provider: Avon Gully, MD Primary Care Physician:  Avon Gully, MD Primary Gastroenterologist: Dr. Jena Gauss   Chief Complaint  Patient presents with  . Abdominal Pain  . Rectal Bleeding    from hemorrhoids    HPI:   [name] returns today in f/u with hx of IBS, abdominal pain, H.pylori gastritis, internal hemorrhoids. In the past, lower abdominal discomfort was exacerbated by stress with boyfriend. She states this is not an issue anymore. Now, her family causes stress, thereby precipitating abdominal pain. Reports rectal discomfort, with soft chairs uncomfortable but flat fold-up chairs fine. Pain with BMs sometimes. Hx of hemorrhoidectomy in past per her report . Intermittent paper hematochezia. Stools alternate from loose to soft. Intermittent constipation. Trying to limit dairy intake. Has high fiber diet handout, trying to follow. On Levsin.   Past Medical History  Diagnosis Date  . Migraines   . GERD (gastroesophageal reflux disease)   . Hemorrhoids   . Spina bifida occulta   . S/P colonoscopy March 2011    internal hemorrhoids  . S/P endoscopy March 2011    antral erosions, chronic active gastritis, +H.pylori  . Helicobacter pylori gastritis 2011    s/p treatment with Prevpac    Past Surgical History  Procedure Date  . Cholecystectomy   . Tubal ligation   . Dilation and curettage of uterus   . Endometrial ablation   . Egd/tcs 09/2009    H.Pylori gastritis (s/p tx), benign sb bx, small hh, hemorrhoids, normal TI    Current Outpatient Prescriptions  Medication Sig Dispense Refill  . hyoscyamine (LEVSIN, ANASPAZ) 0.125 MG tablet Take 0.125 mg by mouth every 4 (four) hours as needed.        . naproxen (NAPROSYN) 250 MG tablet Take 250 mg by mouth 2 (two) times daily with a meal.        . omeprazole (PRILOSEC) 20 MG capsule TAKE 1 CAPSULE BY MOUTH 30 MINUTES BEFORE BREAKFAST DAILY  30 capsule  5  . hydrocortisone (ANUSOL-HC) 25 MG suppository Place 1  suppository (25 mg total) rectally every 12 (twelve) hours.  20 suppository  0  . polyethylene glycol powder (GLYCOLAX/MIRALAX) powder Take 17 g by mouth daily. Take 1 capful as needed for a bowel movement. Do not take if having diarrhea.  255 g  3    Allergies as of 04/15/2011 - Review Complete 04/15/2011  Allergen Reaction Noted  . Acetaminophen    . Codeine      Family History  Problem Relation Age of Onset  . Colon cancer Maternal Grandmother     History   Social History  . Marital Status: Single    Spouse Name: N/A    Number of Children: 1  . Years of Education: N/A   Occupational History  . disabled     "spinal issues"   Social History Main Topics  . Smoking status: Current Everyday Smoker -- 0.5 packs/day    Types: Cigarettes  . Smokeless tobacco: None  . Alcohol Use: No  . Drug Use: No  . Sexually Active: None   Other Topics Concern  . None   Social History Narrative  . None    Review of Systems: Gen: Denies fever, chills, anorexia. Denies fatigue, weakness, weight loss.  CV: Denies chest pain, palpitations, syncope, peripheral edema, and claudication. Resp: Denies dyspnea at rest, cough, wheezing, coughing up blood, and pleurisy. GI: Denies vomiting blood, jaundice, and fecal incontinence.   Denies dysphagia or odynophagia. Derm: Denies rash, itching, dry  skin Psych: Denies depression, anxiety, memory loss, confusion. No homicidal or suicidal ideation.  Heme: Denies bruising, bleeding, and enlarged lymph nodes.  Physical Exam: BP 116/71  Pulse 89  Temp(Src) 97.6 F (36.4 C) (Temporal)  Ht 5\' 4"  (1.626 m)  Wt 152 lb 9.6 oz (69.219 kg)  BMI 26.19 kg/m2 General:   Alert and oriented. No distress noted. Pleasant and cooperative.  Head:  Normocephalic and atraumatic. Eyes:  Conjuctiva clear without scleral icterus. Mouth:  Oral mucosa pink and moist. Poor dentition. No lesions. Neck:  Supple, without mass or thyromegaly. Heart:  S1, S2 present  without murmurs, rubs, or gallops. Regular rate and rhythm. Abdomen:  +BS, soft, non-tender and non-distended. No rebound or guarding. No HSM or masses noted. Rectal: two external hemorrhoid tags, no thrombosed hemorrhoids. Internal exam without mass noted, good sphincter tone, no gross blood noted.  Msk:  Symmetrical without gross deformities. Normal posture. Extremities:  Without edema. Neurologic:  Alert and  oriented x4;  grossly normal neurologically. Skin:  Intact without significant lesions or rashes. Cervical Nodes:  No significant cervical adenopathy. Psych:  Alert and cooperative. Normal mood and affect.

## 2011-04-15 NOTE — Assessment & Plan Note (Signed)
Avoid family stressors, which exacerbates lower abdominal discomfort. Continue high fiber diet. Institute avoidance of lactose products. Probiotic, Miralax for constipation. 3 mos follow-up.

## 2011-04-15 NOTE — Assessment & Plan Note (Signed)
40 year old female with known internal hemorrhoids, documented on colonoscopy March 2011. Likely exacerbation due to alternating constipation, diarrhea. Mild amount of hematochezia. Will institute Miralax for constipation, High fiber diet, add probiotic, lactose free diet. Anusol suppositories X 10 days. If no improvement, consider referral to gen surgery. Follow-up in 3 mos or sooner if needed. Pt instructed to call if no improvement after treatment.

## 2011-04-15 NOTE — Patient Instructions (Signed)
Start taking a probiotic daily. Samples have been provided.  Follow a lactose-free diet and high-fiber diet.  Continue Levsin as needed for cramping.  You may take Miralax if you are constipated. 1 capful. Do not take if you are having diarrhea.  We will see you back in 3 months. If you do not have improvement with your hemorrhoids after using the suppositories, call our office.

## 2011-04-16 ENCOUNTER — Emergency Department (HOSPITAL_COMMUNITY)
Admission: EM | Admit: 2011-04-16 | Discharge: 2011-04-16 | Disposition: A | Discharge status: Home / Self Care | Payer: Medicaid Other | Attending: Emergency Medicine | Admitting: Emergency Medicine

## 2011-04-16 ENCOUNTER — Encounter (HOSPITAL_COMMUNITY): Payer: Self-pay | Admitting: *Deleted

## 2011-04-16 DIAGNOSIS — G43909 Migraine, unspecified, not intractable, without status migrainosus: Secondary | ICD-10-CM | POA: Insufficient documentation

## 2011-04-16 DIAGNOSIS — F17200 Nicotine dependence, unspecified, uncomplicated: Secondary | ICD-10-CM | POA: Insufficient documentation

## 2011-04-16 DIAGNOSIS — T6391XA Toxic effect of contact with unspecified venomous animal, accidental (unintentional), initial encounter: Secondary | ICD-10-CM | POA: Insufficient documentation

## 2011-04-16 DIAGNOSIS — T63441A Toxic effect of venom of bees, accidental (unintentional), initial encounter: Secondary | ICD-10-CM

## 2011-04-16 DIAGNOSIS — T63461A Toxic effect of venom of wasps, accidental (unintentional), initial encounter: Secondary | ICD-10-CM | POA: Insufficient documentation

## 2011-04-16 DIAGNOSIS — Q760 Spina bifida occulta: Secondary | ICD-10-CM | POA: Insufficient documentation

## 2011-04-16 DIAGNOSIS — K219 Gastro-esophageal reflux disease without esophagitis: Secondary | ICD-10-CM | POA: Insufficient documentation

## 2011-04-16 MED ORDER — DEXAMETHASONE SODIUM PHOSPHATE 4 MG/ML IJ SOLN
8.0000 mg | Freq: Once | INTRAMUSCULAR | Status: AC
  Administered 2011-04-16: 8 mg via INTRAVENOUS
  Filled 2011-04-16 (×2): qty 1

## 2011-04-16 MED ORDER — DIPHENHYDRAMINE HCL 50 MG/ML IJ SOLN
50.0000 mg | Freq: Once | INTRAMUSCULAR | Status: AC
  Administered 2011-04-16: 50 mg via INTRAVENOUS
  Filled 2011-04-16: qty 1

## 2011-04-16 NOTE — ED Notes (Signed)
Pt states she got stung to L side of back of head and L lower calf approx ago. No resp distress. No markings found. No symptoms at present except little pain to sting sites. NAD

## 2011-04-16 NOTE — ED Provider Notes (Signed)
History     CSN: [id number] Arrival date & time: 04/16/2011  2:20 PM   Chief Complaint  Patient presents with  . Insect Bite     (Include location/radiation/quality/duration/timing/severity/associated sxs/prior treatment) HPI Comments: Pt states she wa stung several times by yellow jackets: L occipital area and L lower leg.  Brought to ED by EMS who started an IV but gave no meds.  She denies SOB, diff swallowing or itching.  She has taken no meds on her own.  The history is provided by the patient. No language interpreter was used.     Past Medical History  Diagnosis Date  . Migraines   . GERD (gastroesophageal reflux disease)   . Hemorrhoids   . Spina bifida occulta   . S/P colonoscopy March 2011    internal hemorrhoids  . S/P endoscopy March 2011    antral erosions, chronic active gastritis, +H.pylori  . Helicobacter pylori gastritis 2011    s/p treatment with Prevpac     Past Surgical History  Procedure Date  . Cholecystectomy   . Tubal ligation   . Dilation and curettage of uterus   . Endometrial ablation   . Egd/tcs 09/2009    H.Pylori gastritis (s/p tx), benign sb bx, small hh, hemorrhoids, normal TI    Family History  Problem Relation Age of Onset  . Colon cancer Maternal Grandmother     History  Substance Use Topics  . Smoking status: Current Everyday Smoker -- 0.5 packs/day    Types: Cigarettes  . Smokeless tobacco: Not on file  . Alcohol Use: No    OB History    Grav Para Term Preterm Abortions TAB SAB Ect Mult Living                  Review of Systems  Respiratory: Negative for chest tightness, shortness of breath, wheezing and stridor.   Cardiovascular: Negative for chest pain.  Skin: Negative for rash.    Allergies  Acetaminophen and Codeine  Home Medications   Current Outpatient Rx  Name Route Sig Dispense Refill  . HYOSCYAMINE SULFATE 0.125 MG PO TABS Oral Take 0.125 mg by mouth every 4 (four) hours as needed. Migraine Headache     . BUTALBITAL-APAP-CAFFEINE 50-325-40 MG PO TABS Oral Take 1 tablet by mouth 2 (two) times daily as needed. Pain     . HYDROCORTISONE ACETATE 25 MG RE SUPP Rectal Place 1 suppository (25 mg total) rectally every 12 (twelve) hours. 20 suppository 0  . NAPROXEN 250 MG PO TABS Oral Take 250 mg by mouth 2 (two) times daily with a meal.      . OMEPRAZOLE 20 MG PO CPDR  TAKE 1 CAPSULE BY MOUTH 30 MINUTES BEFORE BREAKFAST DAILY 30 capsule 5  . POLYETHYLENE GLYCOL 3350 PO POWD Oral Take 17 g by mouth daily. Take 1 capful as needed for a bowel movement. Do not take if having diarrhea. 255 g 3    Physical Exam    BP 112/67  Pulse 74  Temp(Src) 98.6 F (37 C) (Oral)  Resp 21  SpO2 97%  Physical Exam  Nursing note and vitals reviewed. Constitutional: She is oriented to person, place, and time. Vital signs are normal. She appears well-developed and well-nourished. No distress.  HENT:  Head: Normocephalic and atraumatic.  Right Ear: External ear normal.  Left Ear: External ear normal.  Nose: Nose normal.  Mouth/Throat: No oropharyngeal exudate.       No visible redness or swelling to L  occipital scalp  Eyes: Conjunctivae and EOM are normal. Pupils are equal, round, and reactive to light. Right eye exhibits no discharge. Left eye exhibits no discharge. No scleral icterus.  Neck: Normal range of motion. Neck supple. No JVD present. No tracheal deviation present. No thyromegaly present.  Cardiovascular: Normal rate, regular rhythm, normal heart sounds, intact distal pulses and normal pulses.  Exam reveals no gallop and no friction rub.   No murmur heard. Pulmonary/Chest: Effort normal and breath sounds normal. No accessory muscle usage or stridor. Not tachypneic. No respiratory distress. She has no wheezes. She has no rales. She exhibits no tenderness.  Abdominal: Soft. Normal appearance and bowel sounds are normal. She exhibits no distension and no mass. There is no tenderness. There is no rebound  and no guarding.  Musculoskeletal: Normal range of motion. She exhibits no edema and no tenderness.       Left lower leg: She exhibits no tenderness, no swelling, no edema, no deformity and no laceration.       No visible swelling or redness to L proximal/lateral lower leg.  Lymphadenopathy:    She has no cervical adenopathy.  Neurological: She is alert and oriented to person, place, and time. She has normal reflexes. No cranial nerve deficit. Coordination normal. GCS eye subscore is 4. GCS verbal subscore is 5. GCS motor subscore is 6.  Skin: Skin is warm and dry. No rash noted. She is not diaphoretic.  Psychiatric: She has a normal mood and affect. Her speech is normal and behavior is normal. Judgment and thought content normal. Cognition and memory are normal.    ED Course  Procedures  Results for orders placed during the hospital encounter of 01/30/11  DIFFERENTIAL      Component Value Range   Neutrophils Relative 34 (*) 43 - 77 (%)   Neutro Abs 1.9  1.7 - 7.7 (K/uL)   Lymphocytes Relative 51 (*) 12 - 46 (%)   Lymphs Abs 2.9  0.7 - 4.0 (K/uL)   Monocytes Relative 8  3 - 12 (%)   Monocytes Absolute 0.5  0.1 - 1.0 (K/uL)   Eosinophils Relative 6 (*) 0 - 5 (%)   Eosinophils Absolute 0.3  0.0 - 0.7 (K/uL)   Basophils Relative 0  0 - 1 (%)   Basophils Absolute 0.0  0.0 - 0.1 (K/uL)  CBC      Component Value Range   WBC 5.6  4.0 - 10.5 (K/uL)   RBC 4.46  3.87 - 5.11 (MIL/uL)   Hemoglobin 13.6  12.0 - 15.0 (g/dL)   HCT 04.5  40.9 - 81.1 (%)   MCV 91.0  78.0 - 100.0 (fL)   MCH 30.5  26.0 - 34.0 (pg)   MCHC 33.5  30.0 - 36.0 (g/dL)   RDW 91.4  78.2 - 95.6 (%)   Platelets 304  150 - 400 (K/uL)  POCT PREGNANCY, URINE      Component Value Range   Preg Test, Ur NEGATIVE    BASIC METABOLIC PANEL      Component Value Range   Sodium 137  135 - 145 (mEq/L)   Potassium 3.0 (*) 3.5 - 5.1 (mEq/L)   Chloride 102  96 - 112 (mEq/L)   CO2 26  19 - 32 (mEq/L)   Glucose, Bld 127 (*) 70 - 99  (mg/dL)   BUN 12  6 - 23 (mg/dL)   Creatinine, Ser 2.13  0.50 - 1.10 (mg/dL)   Calcium 08.6  8.4 - 10.5 (  mg/dL)   GFR calc non Af Amer >60  >60 (mL/min)   GFR calc Af Amer >60  >60 (mL/min)  CK      Component Value Range   Total CK 90  7 - 177 (U/L)  URINALYSIS, ROUTINE W REFLEX MICROSCOPIC      Component Value Range   Color, Urine YELLOW  YELLOW    Appearance CLOUDY (*) CLEAR    Specific Gravity, Urine >1.030 (*) 1.005 - 1.030    pH 5.5  5.0 - 8.0    Glucose, UA NEGATIVE  NEGATIVE (mg/dL)   Hgb urine dipstick NEGATIVE  NEGATIVE    Bilirubin Urine SMALL (*) NEGATIVE    Ketones, ur NEGATIVE  NEGATIVE (mg/dL)   Protein, ur NEGATIVE  NEGATIVE (mg/dL)   Urobilinogen, UA 1.0  0.0 - 1.0 (mg/dL)   Nitrite NEGATIVE  NEGATIVE    Leukocytes, UA NEGATIVE  NEGATIVE    No results found.   No diagnosis found.   MDM        Worthy Rancher, PA 04/16/11 [phone]

## 2011-04-16 NOTE — Progress Notes (Signed)
Cc to PCP 

## 2011-04-16 NOTE — ED Provider Notes (Signed)
Medical screening examination/treatment/procedure(s) were performed by non-physician practitioner and as supervising physician I was immediately available for consultation/collaboration.  Donnetta Hutching, MD 04/16/11 [id number]

## 2011-05-03 ENCOUNTER — Ambulatory Visit: Payer: Medicaid Other | Admitting: Gastroenterology

## 2011-05-04 ENCOUNTER — Ambulatory Visit: Payer: Medicaid Other | Admitting: Urgent Care

## 2011-05-06 ENCOUNTER — Ambulatory Visit (INDEPENDENT_AMBULATORY_CARE_PROVIDER_SITE_OTHER): Payer: Medicaid Other | Admitting: Urgent Care

## 2011-05-06 ENCOUNTER — Encounter: Payer: Self-pay | Admitting: Urgent Care

## 2011-05-06 DIAGNOSIS — K649 Unspecified hemorrhoids: Secondary | ICD-10-CM

## 2011-05-06 DIAGNOSIS — K589 Irritable bowel syndrome without diarrhea: Secondary | ICD-10-CM

## 2011-05-06 DIAGNOSIS — K219 Gastro-esophageal reflux disease without esophagitis: Secondary | ICD-10-CM

## 2011-05-06 MED ORDER — HYDROCORTISONE ACE-PRAMOXINE 2.5-1 % RE CREA: TOPICAL_CREAM | Freq: Three times a day (TID) | RECTAL | Status: AC

## 2011-05-06 NOTE — Assessment & Plan Note (Addendum)
MiraLax 17 g daily when necessary. Call if any problems. Office visit in one year or sooner if needed.

## 2011-05-06 NOTE — Progress Notes (Signed)
Cc to PCP 

## 2011-05-06 NOTE — Progress Notes (Signed)
Referring Provider: Avon Gully, MD Primary Care Physician:  Avon Gully, MD Primary Gastroenterologist:  Dr. Jena Gauss  Chief Complaint  Patient presents with  . Rectal Bleeding    hemorrhoids    HPI:  Alyssa Aguilar is a 40 y.o. female here for follow up for hemorrhoids, GERD & IBS.  Tells me she couldn't get hemorrhoid treatment because insurance wouldn't pay for it. Occasional generalized abdominal pain that is resolved after defecation.  Says her family is stressing her out.  Makes IBS symptoms worse.  Alters between diarrhea/constipation.  Lives w/ grandmother.  Lots of arguing & fighting because her family does not like her boyfriend.    Past Medical History  Diagnosis Date  . Migraines   . GERD (gastroesophageal reflux disease)   . Hemorrhoids   . Spina bifida occulta   . S/P colonoscopy March 2011    internal hemorrhoids  . S/P endoscopy March 2011    antral erosions, chronic active gastritis, +H.pylori  . Helicobacter pylori gastritis 2011    s/p treatment with Prevpac  . IBS (irritable bowel syndrome)     Past Surgical History  Procedure Date  . Cholecystectomy   . Tubal ligation   . Dilation and curettage of uterus   . Endometrial ablation   . Egd/tcs 09/2009    H.Pylori gastritis (s/p tx), benign sb bx, small hh, hemorrhoids, normal TI    Current Outpatient Prescriptions  Medication Sig Dispense Refill  . baclofen (LIORESAL) 10 MG tablet Take 10 mg by mouth 3 (three) times daily.        . butalbital-acetaminophen-caffeine (FIORICET) 50-325-40 MG per tablet Take 1 tablet by mouth 2 (two) times daily as needed. Pain       . hyoscyamine (LEVSIN, ANASPAZ) 0.125 MG tablet Take 0.125 mg by mouth every 4 (four) hours as needed. Migraine Headache      . naproxen (NAPROSYN) 250 MG tablet Take 250 mg by mouth 2 (two) times daily with a meal.        . omeprazole (PRILOSEC) 20 MG capsule TAKE 1 CAPSULE BY MOUTH 30 MINUTES BEFORE BREAKFAST DAILY  30 capsule  5  .  hydrocortisone (ANUSOL-HC) 25 MG suppository Place 1 suppository (25 mg total) rectally every 12 (twelve) hours.  20 suppository  0  . hydrocortisone-pramoxine (ANALPRAM-HC) 2.5-1 % rectal cream Place rectally 3 (three) times daily.  30 g  0    Allergies as of 05/06/2011 - Review Complete 05/06/2011  Allergen Reaction Noted  . Acetaminophen Swelling   . Codeine Other (See Comments)     Review of Systems: Gen: Denies any fever, chills, sweats, anorexia, fatigue, weakness, malaise, weight loss, and sleep disorder CV: Denies chest pain, angina, palpitations, syncope, orthopnea, PND, peripheral edema, and claudication. Resp: Denies dyspnea at rest, dyspnea with exercise, cough, sputum, wheezing, coughing up blood, and pleurisy. GI: Denies vomiting blood, jaundice, and fecal incontinence.   Denies dysphagia or odynophagia. Derm: Denies rash, itching, dry skin, hives, moles, warts, or unhealing ulcers.  Psych: Denies depression, anxiety, memory loss, suicidal ideation, hallucinations, paranoia, and confusion. Heme: Denies bruising, bleeding, and enlarged lymph nodes.  Physical Exam: BP 106/66  Pulse 75  Temp(Src) 97.3 F (36.3 C) (Temporal)  Ht 5\' 4"  (1.626 m)  Wt 153 lb 12.8 oz (69.763 kg)  BMI 26.40 kg/m2 General:   Alert,  Well-developed, well-nourished, pleasant and cooperative in NAD Head:  Normocephalic and atraumatic. Eyes:  Sclera clear, no icterus.   Conjunctiva pink. Mouth:  No deformity or lesions,  OP pink/moist. Neck:  Supple; no masses or thyromegaly. Heart:  Regular rate and rhythm; no murmurs, clicks, rubs,  or gallops. Abdomen:  Soft, nontender and nondistended. No masses, hepatosplenomegaly or hernias noted. Normal bowel sounds, without guarding, and without rebound.   Msk:  Symmetrical without gross deformities. Normal posture. Pulses:  Normal pulses noted. Extremities:  Without clubbing or edema. Neurologic:  Alert and  oriented x4;  grossly normal  neurologically. Skin:  Intact without significant lesions or rashes. Cervical Nodes:  No significant cervical adenopathy. Psych:  Alert and cooperative. Normal mood and affect.

## 2011-05-06 NOTE — Assessment & Plan Note (Signed)
Hx internal hemorrhoids never treated because she could not afford anusol.  Trial Analpram. She is to call if no relief.

## 2011-05-06 NOTE — Patient Instructions (Signed)
If medicine is too expensive let me know Hemorrhoids Hemorrhoids are dilated (enlarged) veins around the rectum. Sometimes clots will form in the veins. This makes them swollen and painful. These are called thrombosed hemorrhoids. Causes of hemorrhoids include:  Pregnancy: this increases the pressure in the hemorrhoidal veins.   Constipation.   Straining to have a bowel movement.  HOME CARE INSTRUCTIONS  Eat a well balanced diet and drink 6 to 8 glasses of water every day to avoid constipation. You may also use a bulk laxative.   Avoid straining to have bowel movements.   Keep anal area dry and clean.   Only take over-the-counter or prescription medicines for pain, discomfort, or fever as directed by your caregiver.  If thrombosed:  Take hot sitz baths for 20 to 30 minutes, 3 to 4 times per day.   If the hemorrhoids are very tender and swollen, place ice packs on area as tolerated. Using ice packs between sitz baths may be helpful. Fill a plastic bag with ice and use a towel between the bag of ice and your skin.   Special creams and suppositories (Anusol, Nupercainal, Wyanoids) may be used or applied as directed.   Do not use a donut shaped pillow or sit on the toilet for long periods. This increases blood pooling and pain.   Move your bowels when your body has the urge; this will require less straining and will decrease pain and pressure.   Only take over-the-counter or prescription medicines for pain, discomfort, or fever as directed by your caregiver.  SEEK MEDICAL CARE IF:  You have increasing pain and swelling that is not controlled with your prescription.   You have uncontrolled bleeding.   You have an inability or difficulty having a bowel movement.   You have pain or inflammation outside the area of the hemorrhoids.   You have chills and/or an oral temperature above 101 that lasts for 2 days or longer, or as your caregiver suggests.  MAKE SURE YOU:   Understand  these instructions.   Will watch your condition.   Will get help right away if you are not doing well or get worse.  Document Released: 07/16/2000 Document Re-Released: 07/01/2008 [facility] Patient Information 2011 [place], [place].

## 2011-05-07 ENCOUNTER — Ambulatory Visit: Payer: Self-pay | Admitting: Internal Medicine

## 2011-05-10 ENCOUNTER — Ambulatory Visit: Payer: Medicaid Other | Admitting: Internal Medicine

## 2011-05-19 LAB — DIFFERENTIAL
Basophils Absolute: 0
Basophils Relative: 1
Eosinophils Relative: 5
Monocytes Absolute: 0.2
Neutro Abs: 1.3 — ABNORMAL LOW

## 2011-05-19 LAB — COMPREHENSIVE METABOLIC PANEL
AST: 17
Albumin: 3 — ABNORMAL LOW
Alkaline Phosphatase: 61
BUN: 4 — ABNORMAL LOW
CO2: 27
Chloride: 107
GFR calc non Af Amer: >60
Potassium: 3.3 — ABNORMAL LOW
Total Bilirubin: 0.5

## 2011-05-19 LAB — URINALYSIS, ROUTINE W REFLEX MICROSCOPIC
Hgb urine dipstick: NEGATIVE
Nitrite: NEGATIVE
Specific Gravity, Urine: 1.025
Urobilinogen, UA: 0.2

## 2011-05-19 LAB — CBC
HCT: 33.1 — ABNORMAL LOW
Platelets: 387
RBC: 3.73 — ABNORMAL LOW
WBC: 3.6 — ABNORMAL LOW

## 2011-05-19 LAB — URINE MICROSCOPIC-ADD ON

## 2011-05-19 LAB — HCG, QUANTITATIVE, PREGNANCY: hCG, Beta Chain, Quant, S: <2

## 2011-05-24 ENCOUNTER — Encounter (HOSPITAL_COMMUNITY): Payer: Self-pay | Admitting: *Deleted

## 2011-05-24 ENCOUNTER — Emergency Department (HOSPITAL_COMMUNITY)
Admission: EM | Admit: 2011-05-24 | Discharge: 2011-05-25 | Disposition: A | Discharge status: Home / Self Care | Payer: Medicaid Other | Attending: Emergency Medicine | Admitting: Emergency Medicine

## 2011-05-24 DIAGNOSIS — K589 Irritable bowel syndrome without diarrhea: Secondary | ICD-10-CM | POA: Insufficient documentation

## 2011-05-24 DIAGNOSIS — F17200 Nicotine dependence, unspecified, uncomplicated: Secondary | ICD-10-CM | POA: Insufficient documentation

## 2011-05-24 DIAGNOSIS — R112 Nausea with vomiting, unspecified: Secondary | ICD-10-CM | POA: Insufficient documentation

## 2011-05-24 DIAGNOSIS — Q760 Spina bifida occulta: Secondary | ICD-10-CM | POA: Insufficient documentation

## 2011-05-24 DIAGNOSIS — K219 Gastro-esophageal reflux disease without esophagitis: Secondary | ICD-10-CM | POA: Insufficient documentation

## 2011-05-24 DIAGNOSIS — R51 Headache: Secondary | ICD-10-CM

## 2011-05-24 MED ORDER — METOCLOPRAMIDE HCL 5 MG/ML IJ SOLN
10.0000 mg | Freq: Once | INTRAMUSCULAR | Status: AC
  Administered 2011-05-24: 10 mg via INTRAVENOUS
  Filled 2011-05-24: qty 2

## 2011-05-24 MED ORDER — KETOROLAC TROMETHAMINE 30 MG/ML IJ SOLN
30.0000 mg | Freq: Once | INTRAMUSCULAR | Status: AC
Start: 2011-05-24 — End: 2011-05-24
  Administered 2011-05-24: 30 mg via INTRAVENOUS
  Filled 2011-05-24: qty 1

## 2011-05-24 MED ORDER — ONDANSETRON HCL 4 MG/2ML IJ SOLN
4.0000 mg | Freq: Once | INTRAMUSCULAR | Status: AC
Start: 2011-05-24 — End: 2011-05-24
  Administered 2011-05-24: 4 mg via INTRAVENOUS
  Filled 2011-05-24: qty 2

## 2011-05-24 MED ORDER — SODIUM CHLORIDE 0.9 % IV BOLUS (SEPSIS)
1000.0000 mL | Freq: Once | INTRAVENOUS | Status: AC
  Administered 2011-05-24: 1000 mL via INTRAVENOUS

## 2011-05-24 NOTE — ED Notes (Signed)
Headache intermittent onset yesterday

## 2011-05-24 NOTE — ED Provider Notes (Signed)
History     CSN: [id number] Arrival date & time: 05/24/2011 10:06 PM   First MD Initiated Contact with Patient 05/24/11 2256      Chief Complaint  Patient presents with  . Headache    (Consider location/radiation/quality/duration/timing/severity/associated sxs/prior treatment) HPI Comments: Patient is a 40 year old female with a history of almost daily headaches for a "long time". Patient states that she had a headache over the last 2 days which is like a "migraine". She often takes [place] powders or Vicodin so she denies having these medications in the last 24 hours. She also denies stiff neck, weakness numbness, sinus drainage, sore throat. Symptoms are constant, associated with photophobia and nausea and vomiting times multiple episodes today.  The history is provided by the patient and medical records.    Past Medical History  Diagnosis Date  . Migraines   . GERD (gastroesophageal reflux disease)   . Hemorrhoids   . Spina bifida occulta   . S/P colonoscopy March 2011    internal hemorrhoids  . S/P endoscopy March 2011    antral erosions, chronic active gastritis, +H.pylori  . Helicobacter pylori gastritis 2011    s/p treatment with Prevpac  . IBS (irritable bowel syndrome)     Past Surgical History  Procedure Date  . Cholecystectomy   . Tubal ligation   . Dilation and curettage of uterus   . Endometrial ablation   . Egd/tcs 09/2009    H.Pylori gastritis (s/p tx), benign sb bx, small hh, hemorrhoids, normal TI    Family History  Problem Relation Age of Onset  . Colon cancer Maternal Grandmother     History  Substance Use Topics  . Smoking status: Current Everyday Smoker -- 0.5 packs/day    Types: Cigarettes  . Smokeless tobacco: Not on file  . Alcohol Use: No    OB History    Grav Para Term Preterm Abortions TAB SAB Ect Mult Living                  Review of Systems  All other systems reviewed and are negative.    Allergies  Acetaminophen and  Codeine  Home Medications   Current Outpatient Rx  Name Route Sig Dispense Refill  . BACLOFEN 10 MG PO TABS Oral Take 10 mg by mouth 3 (three) times daily.      Marland Kitchen NAPROXEN 250 MG PO TABS Oral Take 250 mg by mouth 2 (two) times daily with a meal.      . OMEPRAZOLE 20 MG PO CPDR  TAKE 1 CAPSULE BY MOUTH 30 MINUTES BEFORE BREAKFAST DAILY 30 capsule 5  . BUTALBITAL-APAP-CAFFEINE 50-325-40 MG PO TABS Oral Take 1 tablet by mouth 2 (two) times daily as needed. Pain     . HYDROCORTISONE ACETATE 25 MG RE SUPP Rectal Place 1 suppository (25 mg total) rectally every 12 (twelve) hours. 20 suppository 0  . HYOSCYAMINE SULFATE 0.125 MG PO TABS Oral Take 0.125 mg by mouth every 4 (four) hours as needed. Migraine Headache      BP 116/83  Pulse 84  Temp(Src) 98.5 F (36.9 C) (Oral)  Resp 12  Ht 5\' 4"  (1.626 m)  Wt 150 lb (68.04 kg)  BMI 25.75 kg/m2  SpO2 100%  Physical Exam  Nursing note and vitals reviewed. Constitutional: She appears well-developed and well-nourished. No distress.  HENT:  Head: Normocephalic and atraumatic.  Eyes: Conjunctivae and EOM are normal. Pupils are equal, round, and reactive to light. Right eye exhibits no discharge. Left  eye exhibits no discharge. No scleral icterus.  Neck: Normal range of motion. Neck supple. No JVD present. No thyromegaly present.  Cardiovascular: Normal rate, regular rhythm, normal heart sounds and intact distal pulses.  Exam reveals no gallop and no friction rub.   No murmur heard. Pulmonary/Chest: Effort normal and breath sounds normal. No respiratory distress. She has no wheezes. She has no rales.  Musculoskeletal: Normal range of motion. She exhibits no edema and no tenderness.  Lymphadenopathy:    She has no cervical adenopathy.  Neurological: She is alert. Coordination normal.       Speech clear, follows commands, moves all extremities appropriately. Pupillary exam normal extraocular movements intact, cranial nerves III through XII are  normal as tested  Skin: Skin is warm and dry. No rash noted. No erythema.  Psychiatric: She has a normal mood and affect. Her behavior is normal.    ED Course  Procedures (including critical care time)  Labs Reviewed - No data to display No results found.   No diagnosis found.    MDM  Patient having ongoing nausea and vomiting associated with headache. She states that this is very similar to headaches that she has very often. There is no signs of neurologic deficits or infectious etiologies for this headache. Will ensue with IV fluid rehydration along with Toradol and metoclopramide IV. Reevaluate after medications given.  She states headache better and requests discharge  Vida Roller, MD 05/25/11 [phone]

## 2011-05-25 MED ORDER — IBUPROFEN 600 MG PO TABS: 600.0000 mg | ORAL_TABLET | Freq: Four times a day (QID) | ORAL | Status: AC | PRN

## 2011-05-25 NOTE — ED Notes (Signed)
D/c instructions reviewed w/ pt and family - pt and family deny any further questions or concerns at present.\ 

## 2011-06-04 ENCOUNTER — Other Ambulatory Visit (HOSPITAL_COMMUNITY): Payer: Self-pay | Admitting: Internal Medicine

## 2011-06-04 ENCOUNTER — Other Ambulatory Visit: Payer: Self-pay | Admitting: Obstetrics & Gynecology

## 2011-06-04 ENCOUNTER — Other Ambulatory Visit (HOSPITAL_COMMUNITY)
Admission: RE | Admit: 2011-06-04 | Discharge: 2011-06-04 | Disposition: A | Discharge status: Home / Self Care | Payer: Medicaid Other | Source: Ambulatory Visit | Attending: Obstetrics & Gynecology | Admitting: Obstetrics & Gynecology

## 2011-06-04 DIAGNOSIS — Z139 Encounter for screening, unspecified: Secondary | ICD-10-CM

## 2011-06-04 DIAGNOSIS — Z01419 Encounter for gynecological examination (general) (routine) without abnormal findings: Secondary | ICD-10-CM | POA: Insufficient documentation

## 2011-06-07 ENCOUNTER — Ambulatory Visit (HOSPITAL_COMMUNITY)
Admission: RE | Admit: 2011-06-07 | Discharge: 2011-06-07 | Disposition: A | Discharge status: Home / Self Care | Payer: Medicaid Other | Source: Ambulatory Visit | Attending: Internal Medicine | Admitting: Internal Medicine

## 2011-06-07 DIAGNOSIS — Z139 Encounter for screening, unspecified: Secondary | ICD-10-CM

## 2011-06-07 DIAGNOSIS — Z1231 Encounter for screening mammogram for malignant neoplasm of breast: Secondary | ICD-10-CM | POA: Insufficient documentation

## 2011-06-17 ENCOUNTER — Other Ambulatory Visit: Payer: Self-pay | Admitting: Internal Medicine

## 2011-06-17 ENCOUNTER — Encounter: Payer: Self-pay | Admitting: Internal Medicine

## 2011-06-17 DIAGNOSIS — R928 Other abnormal and inconclusive findings on diagnostic imaging of breast: Secondary | ICD-10-CM

## 2011-07-01 NOTE — Progress Notes (Signed)
REVIEWED.  

## 2011-07-07 ENCOUNTER — Ambulatory Visit (HOSPITAL_COMMUNITY)
Admission: RE | Admit: 2011-07-07 | Discharge: 2011-07-07 | Disposition: A | Discharge status: Home / Self Care | Payer: Medicaid Other | Source: Ambulatory Visit | Attending: Internal Medicine | Admitting: Internal Medicine

## 2011-07-07 ENCOUNTER — Other Ambulatory Visit: Payer: Self-pay | Admitting: Internal Medicine

## 2011-07-07 DIAGNOSIS — R928 Other abnormal and inconclusive findings on diagnostic imaging of breast: Secondary | ICD-10-CM

## 2011-07-19 ENCOUNTER — Encounter: Payer: Self-pay | Admitting: Gastroenterology

## 2011-07-19 ENCOUNTER — Ambulatory Visit (INDEPENDENT_AMBULATORY_CARE_PROVIDER_SITE_OTHER): Payer: Medicaid Other | Admitting: Gastroenterology

## 2011-07-19 VITALS — BP 108/70 | HR 62 | Temp 98.2°F | Ht 64.0 in | Wt 167.2 lb

## 2011-07-19 DIAGNOSIS — K589 Irritable bowel syndrome without diarrhea: Secondary | ICD-10-CM

## 2011-07-19 DIAGNOSIS — K219 Gastro-esophageal reflux disease without esophagitis: Secondary | ICD-10-CM

## 2011-07-19 MED ORDER — POLYETHYLENE GLYCOL 3350 17 GM/SCOOP PO POWD: 17.0000 g | Freq: Every day | ORAL | Status: AC

## 2011-07-19 NOTE — Progress Notes (Signed)
Referring Provider: Avon Gully, MD Primary Care Physician:  Avon Gully, MD Primary Gastroenterologist: Dr. Jena Gauss   Chief Complaint  Patient presents with  . Follow-up    HPI:   Alyssa Aguilar returns today in follow-up for GERD and IBS. She is dealing with constipation lately. States intermittent hematochezia has improved. She is not taking Miralax as instructed at last visit, nor is she really following a high fiber diet. She will have rare episodes of diarrhea depending onf ood choices. She notes a few days between BMs. No abdominal pain. No difficulties with GERD. Wt 153 in October, now up to 167. Daughter is present today with her.    Past Medical History  Diagnosis Date  . Migraines   . GERD (gastroesophageal reflux disease)   . Hemorrhoids   . Spina bifida occulta   . S/P colonoscopy March 2011    internal hemorrhoids  . S/P endoscopy March 2011    antral erosions, chronic active gastritis, +H.pylori  . Helicobacter pylori gastritis 2011    s/p treatment with Prevpac  . IBS (irritable bowel syndrome)     Past Surgical History  Procedure Date  . Cholecystectomy   . Tubal ligation   . Dilation and curettage of uterus   . Endometrial ablation   . Egd/tcs 09/2009    H.Pylori gastritis (s/p tx), benign sb bx, small hh, hemorrhoids, normal TI    Current Outpatient Prescriptions  Medication Sig Dispense Refill  . baclofen (LIORESAL) 10 MG tablet Take 10 mg by mouth 3 (three) times daily.        . butalbital-acetaminophen-caffeine (FIORICET) 50-325-40 MG per tablet Take 1 tablet by mouth 2 (two) times daily as needed. Pain       . hydrocortisone (ANUSOL-HC) 25 MG suppository Place 1 suppository (25 mg total) rectally every 12 (twelve) hours.  20 suppository  0  . hyoscyamine (LEVSIN, ANASPAZ) 0.125 MG tablet Take 0.125 mg by mouth every 4 (four) hours as needed. Migraine Headache      . naproxen (NAPROSYN) 250 MG tablet Take 250 mg by mouth 2 (two) times daily with a meal.         . omeprazole (PRILOSEC) 20 MG capsule TAKE 1 CAPSULE BY MOUTH 30 MINUTES BEFORE BREAKFAST DAILY  30 capsule  5    Allergies as of 07/19/2011 - Review Complete 07/19/2011  Allergen Reaction Noted  . Acetaminophen Swelling and Other (See Comments)   . Codeine Other (See Comments)     Family History  Problem Relation Age of Onset  . Colon cancer Maternal Grandmother     History   Social History  . Marital Status: Single    Spouse Name: N/A    Number of Children: 1  . Years of Education: N/A   Occupational History  . disabled     "spinal issues"   Social History Main Topics  . Smoking status: Current Everyday Smoker -- 0.5 packs/day    Types: Cigarettes  . Smokeless tobacco: None  . Alcohol Use: No  . Drug Use: No  . Sexually Active: None   Other Topics Concern  . None   Social History Narrative  . None    Review of Systems: Gen: Denies fever, chills, anorexia. Denies fatigue, weakness, weight loss.  CV: Denies chest pain, palpitations, syncope, peripheral edema, and claudication. Resp: Denies dyspnea at rest, cough, wheezing, coughing up blood, and pleurisy. GI: Denies vomiting blood, jaundice, and fecal incontinence.   Denies dysphagia or odynophagia. Derm: Denies rash, itching, dry  skin Psych: Denies depression, anxiety, memory loss, confusion. No homicidal or suicidal ideation.  Heme: Denies bruising, bleeding, and enlarged lymph nodes.  Physical Exam: BP 108/70  Pulse 62  Temp(Src) 98.2 F (36.8 C) (Temporal)  Ht 5\' 4"  (1.626 m)  Wt 167 lb 3.2 oz (75.841 kg)  BMI 28.70 kg/m2 General:   Alert and oriented. No distress noted. Pleasant and cooperative.  Head:  Normocephalic and atraumatic. Eyes:  Conjuctiva clear without scleral icterus. Mouth:  Oral mucosa pink and moist.  Heart:  S1, S2 present without murmurs, rubs, or gallops. Regular rate and rhythm. Abdomen:  +BS, soft, non-tender and non-distended. No rebound or guarding. No HSM or masses  noted. Msk:  Symmetrical without gross deformities. Normal posture. Extremities:  Without edema. Neurologic:  Alert and  oriented x4;  grossly normal neurologically. Skin:  Intact without significant lesions or rashes. Psych:  Alert and cooperative. Normal mood and affect.

## 2011-07-19 NOTE — Patient Instructions (Signed)
Follow a high fiber diet. This is really important and helps to keep you regulated.  You may take 1 capful of Miralax on any given day that you need to have a bowel movement. Do not take if you are having loose stools that day.  We will see you back in 6 months or sooner as needed.

## 2011-07-20 NOTE — Assessment & Plan Note (Signed)
40 year old female with constipation, likely r/t dietary and behavior factors. Not taking Miralax as prescribed or really adhering to a high fiber diet. I have sent the actual prescription for Miralax to the pharmacy as well as discussed high fiber diet intake. We will see her back in 6 mos or sooner if needed.

## 2011-07-20 NOTE — Assessment & Plan Note (Signed)
Stable, no issues. 6 mos f/u.

## 2011-07-21 NOTE — Progress Notes (Signed)
Cc to PCP 

## 2011-08-04 NOTE — Progress Notes (Signed)
REVIEWED.  

## 2011-10-26 ENCOUNTER — Emergency Department (HOSPITAL_COMMUNITY)
Admission: EM | Admit: 2011-10-26 | Discharge: 2011-10-26 | Disposition: A | Discharge status: Home Health | Payer: Medicaid Other | Attending: Emergency Medicine | Admitting: Emergency Medicine

## 2011-10-26 ENCOUNTER — Encounter (HOSPITAL_COMMUNITY): Payer: Self-pay | Admitting: *Deleted

## 2011-10-26 DIAGNOSIS — F17200 Nicotine dependence, unspecified, uncomplicated: Secondary | ICD-10-CM | POA: Insufficient documentation

## 2011-10-26 DIAGNOSIS — H60399 Other infective otitis externa, unspecified ear: Secondary | ICD-10-CM | POA: Insufficient documentation

## 2011-10-26 DIAGNOSIS — H6092 Unspecified otitis externa, left ear: Secondary | ICD-10-CM

## 2011-10-26 DIAGNOSIS — K219 Gastro-esophageal reflux disease without esophagitis: Secondary | ICD-10-CM | POA: Insufficient documentation

## 2011-10-26 MED ORDER — HYDROCODONE-IBUPROFEN 7.5-200 MG PO TABS: 1.0000 | ORAL_TABLET | Freq: Four times a day (QID) | ORAL | Status: AC | PRN

## 2011-10-26 MED ORDER — AMOXICILLIN 500 MG PO CAPS: 500.0000 mg | ORAL_CAPSULE | Freq: Three times a day (TID) | ORAL | Status: AC

## 2011-10-26 NOTE — ED Provider Notes (Signed)
Medical screening examination/treatment/procedure(s) were performed by non-physician practitioner and as supervising physician I was immediately available for consultation/collaboration. Devoria Albe, MD, Armando Gang   Ward Givens, MD 10/26/11 2017

## 2011-10-26 NOTE — ED Provider Notes (Addendum)
History     CSN: [id number]  Arrival date & time 10/26/11  1245   First MD Initiated Contact with Patient 10/26/11 1431      Chief Complaint  Patient presents with  . Otalgia    (Consider location/radiation/quality/duration/timing/severity/associated sxs/prior treatment) Patient is a 41 y.o. female presenting with ear pain. The history is provided by the patient.  Otalgia This is a new problem. There is pain in the left ear. The problem occurs constantly. The problem has not changed since onset.There has been no fever. The pain is severe. Associated symptoms include headaches. Pertinent negatives include no ear discharge, no hearing loss, no sore throat, no abdominal pain, no neck pain and no cough. Her past medical history does not include hearing loss.    Past Medical History  Diagnosis Date  . Migraines   . GERD (gastroesophageal reflux disease)   . Hemorrhoids   . Spina bifida occulta   . S/P colonoscopy March 2011    internal hemorrhoids  . S/P endoscopy March 2011    antral erosions, chronic active gastritis, +H.pylori  . Helicobacter pylori gastritis 2011    s/p treatment with Prevpac  . IBS (irritable bowel syndrome)     Past Surgical History  Procedure Date  . Cholecystectomy   . Tubal ligation   . Dilation and curettage of uterus   . Endometrial ablation   . Egd/tcs 09/2009    H.Pylori gastritis (s/p tx), benign sb bx, small hh, hemorrhoids, normal TI    Family History  Problem Relation Age of Onset  . Colon cancer Maternal Grandmother     History  Substance Use Topics  . Smoking status: Current Everyday Smoker -- 0.5 packs/day    Types: Cigarettes  . Smokeless tobacco: Not on file  . Alcohol Use: No    OB History    Grav Para Term Preterm Abortions TAB SAB Ect Mult Living                  Review of Systems  Constitutional: Negative for fever, chills and activity change.       All ROS Neg except as noted in HPI  HENT: Positive for ear pain.  Negative for hearing loss, nosebleeds, sore throat, neck pain and ear discharge.   Eyes: Negative for photophobia and discharge.  Respiratory: Negative for cough, shortness of breath and wheezing.   Cardiovascular: Negative for chest pain and palpitations.  Gastrointestinal: Negative for abdominal pain and blood in stool.  Genitourinary: Negative for dysuria, frequency and hematuria.  Musculoskeletal: Negative for back pain and arthralgias.  Skin: Negative.   Neurological: Positive for headaches. Negative for dizziness, seizures and speech difficulty.  Psychiatric/Behavioral: Negative for hallucinations and confusion.    Allergies  Acetaminophen and Codeine  Home Medications   Current Outpatient Rx  Name Route Sig Dispense Refill  . BACLOFEN 10 MG PO TABS Oral Take 10 mg by mouth 3 (three) times daily as needed. migraines    . BUTALBITAL-APAP-CAFFEINE 50-325-40 MG PO TABS Oral Take 1 tablet by mouth 2 (two) times daily as needed. Pain, migraines    . HYDROCORTISONE ACETATE 25 MG RE SUPP Rectal Place 1 suppository (25 mg total) rectally every 12 (twelve) hours. 20 suppository 0  . HYOSCYAMINE SULFATE 0.125 MG PO TABS Oral Take 0.125 mg by mouth every 4 (four) hours as needed. Migraine Headache    . NAPROXEN 250 MG PO TABS Oral Take 250 mg by mouth 2 (two) times daily with a meal.      .  OMEPRAZOLE 20 MG PO CPDR  TAKE 1 CAPSULE BY MOUTH 30 MINUTES BEFORE BREAKFAST DAILY 30 capsule 5    BP 108/60  Pulse 74  Temp(Src) 98.1 F (36.7 C) (Oral)  Resp 16  Ht 5\' 4"  (1.626 m)  Wt 170 lb (77.111 kg)  BMI 29.18 kg/m2  SpO2 100%  Physical Exam  Nursing note and vitals reviewed. Constitutional: She is oriented to person, place, and time. She appears well-developed and well-nourished.  Non-toxic appearance.  HENT:  Head: Normocephalic.  Right Ear: Tympanic membrane normal.  Left Ear: Tympanic membrane normal.       There is increased redness of the external auditory canal on the left.  The external auditory canal and the tympanic membrane on the right are within normal limits the tympanic membrane on left cannot be visualized due to cerumen impaction.  Eyes: EOM and lids are normal. Pupils are equal, round, and reactive to light.  Neck: Normal range of motion. Neck supple. Carotid bruit is not present.  Cardiovascular: Normal rate, regular rhythm, normal heart sounds, intact distal pulses and normal pulses.   Pulmonary/Chest: Breath sounds normal. No respiratory distress.  Abdominal: Soft. Bowel sounds are normal. There is no tenderness. There is no guarding.  Musculoskeletal: Normal range of motion.  Lymphadenopathy:       Head (right side): No submandibular adenopathy present.       Head (left side): No submandibular adenopathy present.    She has no cervical adenopathy.  Neurological: She is alert and oriented to person, place, and time. She has normal strength. No cranial nerve deficit or sensory deficit.  Skin: Skin is warm and dry.  Psychiatric: She has a normal mood and affect. Her speech is normal.    ED Course  Procedures (including critical care time)  Labs Reviewed - No data to display No results found.   Dx: Left External otitis    MDM  I have reviewed nursing notes, vital signs, and all appropriate lab and imaging results for this patient. The patient has an external otitis and on examination today. Will treat with amoxicillin and Vicoprofen. Patient is a primary physician for followup and recheck.       Kathie Dike, PA 10/26/11 1504  Kathie Dike, [place] 12/03/11 [phone]

## 2011-10-26 NOTE — ED Notes (Signed)
Pt DC to home with steady gait and no questions from DC instructions

## 2011-10-26 NOTE — Discharge Instructions (Signed)
PLEASE DO NOT PUT Q-TIPS IN YOUR EARS... Amoxicillin three times daily with food. Vicoprofen every 6 hours as needed for pain. This may cause drowsiness.Otitis Externa Swimmer's ear (otitis externa) is an infection in the outer ear canal. It can be caused by a germ or a fungus. It may be caused by:  Swimming in dirty water.   Water that stays in the ear after swimming.  HOME CARE  Put drops in the ear canal as told by your doctor.   Only take medicine as told by your doctor.  GET HELP RIGHT AWAY IF:   You have a temperature by mouth above 102 F (38.9 C), not controlled by medicine.   There is ear pain after 3 days.  MAKE SURE YOU:   Understand these instructions.   Will watch this condition.   Will get help right away if you are not doing well or get worse.  Document Released: 01/05/2008 Document Revised: 07/08/2011 Document Reviewed: 01/05/2008 [facility] Patient Information 2012 [place], [place].

## 2011-10-26 NOTE — ED Notes (Signed)
Pt presents with earache x 1 month. Pt reports treatment at Dr Letitia Neri office, however pain still persists. Treated with ear gtts per pt.

## 2011-11-06 ENCOUNTER — Emergency Department (HOSPITAL_COMMUNITY)
Admission: EM | Admit: 2011-11-06 | Discharge: 2011-11-06 | Disposition: A | Discharge status: Home / Self Care | Payer: Medicaid Other | Attending: Emergency Medicine | Admitting: Emergency Medicine

## 2011-11-06 ENCOUNTER — Encounter (HOSPITAL_COMMUNITY): Payer: Self-pay | Admitting: Emergency Medicine

## 2011-11-06 DIAGNOSIS — K219 Gastro-esophageal reflux disease without esophagitis: Secondary | ICD-10-CM | POA: Insufficient documentation

## 2011-11-06 DIAGNOSIS — Z4801 Encounter for change or removal of surgical wound dressing: Secondary | ICD-10-CM | POA: Insufficient documentation

## 2011-11-06 DIAGNOSIS — F17200 Nicotine dependence, unspecified, uncomplicated: Secondary | ICD-10-CM | POA: Insufficient documentation

## 2011-11-06 DIAGNOSIS — S90829A Blister (nonthermal), unspecified foot, initial encounter: Secondary | ICD-10-CM

## 2011-11-06 NOTE — ED Notes (Signed)
Pt has blister type wound on heel of right foot after walking this am.

## 2011-11-06 NOTE — ED Provider Notes (Signed)
History     CSN: [id number]  Arrival date & time 11/06/11  1425   First MD Initiated Contact with Patient 11/06/11 1446      Chief Complaint  Patient presents with  . Wound Check    HPI Pt was seen at 1455.  Per pt, c/o gradual onset and persistence of constant "blister" on her right heel that began this morning after she was walking in shoes without socks.  Denies open wounds, no direct injury, no foot or ankle pain, no fevers, no rash.      Past Medical History  Diagnosis Date  . Migraines   . GERD (gastroesophageal reflux disease)   . Hemorrhoids   . Spina bifida occulta   . S/P colonoscopy March 2011    internal hemorrhoids  . S/P endoscopy March 2011    antral erosions, chronic active gastritis, +H.pylori  . Helicobacter pylori gastritis 2011    s/p treatment with Prevpac  . IBS (irritable bowel syndrome)     Past Surgical History  Procedure Date  . Cholecystectomy   . Tubal ligation   . Dilation and curettage of uterus   . Endometrial ablation   . Egd/tcs 09/2009    H.Pylori gastritis (s/p tx), benign sb bx, small hh, hemorrhoids, normal TI    Family History  Problem Relation Age of Onset  . Colon cancer Maternal Grandmother     History  Substance Use Topics  . Smoking status: Current Everyday Smoker -- 0.5 packs/day    Types: Cigarettes  . Smokeless tobacco: Not on file  . Alcohol Use: No    Review of Systems ROS: Statement: All systems negative except as marked or noted in the HPI; Constitutional: Negative for fever and chills. ; ; Eyes: Negative for eye pain, redness and discharge. ; ; ENMT: Negative for ear pain, hoarseness, nasal congestion, sinus pressure and sore throat. ; ; Cardiovascular: Negative for chest pain, palpitations, diaphoresis, dyspnea and peripheral edema. ; ; Respiratory: Negative for cough, wheezing and stridor. ; ; Gastrointestinal: Negative for nausea, vomiting, diarrhea, abdominal pain, blood in stool, hematemesis, jaundice and  rectal bleeding. . ; ; Genitourinary: Negative for dysuria, flank pain and hematuria. ; ; Musculoskeletal: Negative for back pain and neck pain. Negative for swelling and trauma.; ; Skin: +blister. Negative for pruritus, rash, abrasions, bruising and skin lesion.; ; Neuro: Negative for headache, lightheadedness and neck stiffness. Negative for weakness, altered level of consciousness , altered mental status, extremity weakness, paresthesias, involuntary movement, seizure and syncope.     Allergies  Bee venom; Acetaminophen; and Codeine  Home Medications   Current Outpatient Rx  Name Route Sig Dispense Refill  . BACLOFEN 10 MG PO TABS Oral Take 10 mg by mouth 3 (three) times daily as needed. migraines    . BUTALBITAL-APAP-CAFFEINE 50-325-40 MG PO TABS Oral Take 1 tablet by mouth 2 (two) times daily as needed. Pain, migraines    . HYOSCYAMINE SULFATE 0.125 MG PO TABS Oral Take 0.125 mg by mouth every 4 (four) hours as needed. Migraine Headache    . NAPROXEN 250 MG PO TABS Oral Take 250 mg by mouth 2 (two) times daily with a meal.      . AMOXICILLIN 500 MG PO CAPS Oral Take 1 capsule (500 mg total) by mouth 3 (three) times daily. 21 capsule 0  . HYDROCODONE-IBUPROFEN 7.5-200 MG PO TABS Oral Take 1 tablet by mouth every 6 (six) hours as needed for pain. 15 tablet 0  . OMEPRAZOLE 20 MG  PO CPDR  TAKE 1 CAPSULE BY MOUTH 30 MINUTES BEFORE BREAKFAST DAILY 30 capsule 5    There were no vitals taken for this visit.  Physical Exam 1500: Physical examination:  Nursing notes reviewed; Vital signs and O2 SAT reviewed;  Constitutional: Well developed, Well nourished, Well hydrated, In no acute distress; Head:  Normocephalic, atraumatic; Eyes: EOMI, PERRL, No scleral icterus; ENMT: Mouth and pharynx normal, Mucous membranes moist; Neck: Supple, Full range of motion, No lymphadenopathy; Cardiovascular: Regular rate and rhythm, No murmur, rub, or gallop; Respiratory: Breath sounds clear & equal bilaterally, No  rales, rhonchi, wheezes, or rub, Normal respiratory effort/excursion; Chest: Nontender, Movement normal; Extremities: Pulses normal, No ankle or foot tenderness, No ankle or foot edema, No calf edema or asymmetry. +intact blister to right heel; Neuro: AA&Ox3, Major CN grossly intact.  No gross focal motor or sensory deficits in extremities.; Skin: Color normal, Warm, Dry, no rash.    ED Course  Procedures   MDM  MDM Reviewed: nursing note and vitals      3:19 PM:  Blister is intact.  Pt's feet are VERY dirty.  Strict instructions regarding washing feet with soap and water at least twice a day and covering right heel with bulky dry dressing, keeping skin over blister intact for as long as possible to prevent further injury or infection to skin underneath it.  Verb understanding.        Laray Anger, DO 11/08/11 1303

## 2011-11-06 NOTE — Discharge Instructions (Signed)
RESOURCE GUIDE  Dental Problems  Patients with Medicaid: [facility]                     [facility] [address]                                           [address] Phone:  [phone]                                                  Phone:  [phone]  If unable to pay or uninsured, contact:  [facility] or [facility] to become qualified for the adult dental clinic.  Chronic Pain Problems Contact [facility]  [phone] Patients need to be referred by their primary care doctor.  Insufficient Money for Medicine Contact [employer]:  call "211" or [facility] 271-5999.  No Primary Care Doctor Call [facility]  832-8000 Other agencies that provide inexpensive medical care    [facility]  [phone]    [facility]  [phone]    [facility]  [phone]    Women[facility] Clinic  [phone]    [facility]  [phone]    [facility]  [phone]  Psychological Services [facility]  [phone] [facility]  [phone] [facility]   [phone] (emergency services 641-4993)  Substance Abuse Resources [facility]  336-882-2125 [facility] 336-784-9470 [facility] 336-285-9073 [facility] 336-845-3988 Residential & Outpatient Substance Abuse Program  800-659-3381  Abuse/Neglect [place] Child Abuse Hotline (336) 641-3795 [place] Child Abuse Hotline 800-378-5315 (After Hours)  Emergency Shelter [facility] (336) 271-5985  Maternity Homes [facility] (336) 275-9566 [facility] (704) 372-4663  MRSA Hotline #:   832-7006    [place] Resources  [facility] [place]     [employer]                          [facility] [address] [place]                       [address]      [address]  [place]                                                [place]                            [place] Phone:  [phone]                                   Phone:  [phone]                 Phone:  [phone]  [facility] Phone:  [phone]    [facility] Child Abuse Hotline 201-686-4665 513 696 9392 (After Hours)    Take over the counter tylenol as directed on packaging, as needed for discomfort. Wash the area with soap and water at least twice a day, and cover with a clean/dry dressing.  Change the dressing whenever it becomes wet or soiled.  Call your regular medical doctor on Monday to schedule a follow up appointment for a recheck within the next 3 days.  Return to the Emergency Department immediately if worsening.

## 2011-11-09 ENCOUNTER — Other Ambulatory Visit: Payer: Self-pay | Admitting: Gastroenterology

## 2011-12-02 ENCOUNTER — Other Ambulatory Visit (HOSPITAL_COMMUNITY): Payer: Self-pay | Admitting: Internal Medicine

## 2011-12-02 DIAGNOSIS — Z139 Encounter for screening, unspecified: Secondary | ICD-10-CM

## 2011-12-04 NOTE — ED Provider Notes (Signed)
Medical screening examination/treatment/procedure(s) were performed by non-physician practitioner and as supervising physician I was immediately available for consultation/collaboration. Devoria Albe, MD, Armando Gang   Ward Givens, MD 12/04/11 [phone]

## 2012-01-04 ENCOUNTER — Encounter: Payer: Self-pay | Admitting: Internal Medicine

## 2012-01-10 ENCOUNTER — Ambulatory Visit (HOSPITAL_COMMUNITY): Payer: Medicaid Other

## 2012-01-12 ENCOUNTER — Ambulatory Visit (HOSPITAL_COMMUNITY)
Admission: RE | Admit: 2012-01-12 | Discharge: 2012-01-12 | Disposition: A | Discharge status: Home / Self Care | Payer: Medicaid Other | Source: Ambulatory Visit | Attending: Internal Medicine | Admitting: Internal Medicine

## 2012-01-12 DIAGNOSIS — N63 Unspecified lump in unspecified breast: Secondary | ICD-10-CM | POA: Insufficient documentation

## 2012-01-12 DIAGNOSIS — Z139 Encounter for screening, unspecified: Secondary | ICD-10-CM

## 2012-04-20 ENCOUNTER — Ambulatory Visit (HOSPITAL_COMMUNITY)
Admission: RE | Admit: 2012-04-20 | Discharge: 2012-04-20 | Disposition: A | Discharge status: Home / Self Care | Payer: Medicaid Other | Source: Ambulatory Visit | Attending: Internal Medicine | Admitting: Internal Medicine

## 2012-04-20 ENCOUNTER — Other Ambulatory Visit (HOSPITAL_COMMUNITY): Payer: Self-pay | Admitting: Internal Medicine

## 2012-04-20 ENCOUNTER — Encounter: Payer: Self-pay | Admitting: *Deleted

## 2012-04-20 DIAGNOSIS — M25561 Pain in right knee: Secondary | ICD-10-CM

## 2012-04-20 DIAGNOSIS — M25569 Pain in unspecified knee: Secondary | ICD-10-CM | POA: Insufficient documentation

## 2012-04-26 ENCOUNTER — Ambulatory Visit (INDEPENDENT_AMBULATORY_CARE_PROVIDER_SITE_OTHER): Payer: Medicaid Other | Admitting: Urgent Care

## 2012-04-26 ENCOUNTER — Encounter: Payer: Self-pay | Admitting: Urgent Care

## 2012-04-26 ENCOUNTER — Ambulatory Visit: Payer: Medicaid Other | Admitting: Gastroenterology

## 2012-04-26 VITALS — BP 106/72 | HR 72 | Temp 98.1°F | Ht 64.0 in | Wt 180.4 lb

## 2012-04-26 DIAGNOSIS — K649 Unspecified hemorrhoids: Secondary | ICD-10-CM

## 2012-04-26 DIAGNOSIS — K219 Gastro-esophageal reflux disease without esophagitis: Secondary | ICD-10-CM

## 2012-04-26 DIAGNOSIS — K589 Irritable bowel syndrome without diarrhea: Secondary | ICD-10-CM

## 2012-04-26 DIAGNOSIS — K5900 Constipation, unspecified: Secondary | ICD-10-CM

## 2012-04-26 MED ORDER — POLYETHYLENE GLYCOL 3350 17 GM/SCOOP PO POWD: 17.0000 g | Freq: Every day | ORAL | Status: DC

## 2012-04-26 MED ORDER — HYDROCORTISONE ACETATE 25 MG RE SUPP: 25.0000 mg | Freq: Two times a day (BID) | RECTAL | Status: AC

## 2012-04-26 NOTE — Progress Notes (Signed)
Referring Provider: Avon Gully, MD Primary Care Physician:  Avon Gully, MD Primary Gastroenterologist: Dr. Jena Gauss   Chief Complaint  Patient presents with  . Constipation    HPI:  Alyssa Aguilar is a pleasant 41 y.o. female who is here for follow-up for GERD and IBS.  She is complaining of chronic constipation. She has not tried anything at home for this. She came to up to several days without a bowel movement and at times has bowel movements daily. She denies any melena.  She also has problems with her hemorrhoids. She is complaining of proctalgia, anal pruritus and scant hematochezia on the toilet tissue. Last colonoscopy as below 2 years ago with internal hemorrhoids. Her acid reflux is well controlled on Prilosec 20 mg daily. She is trying to diet she has lost several pounds, however she admits to gaining a few back. She is trying to walk frequently.  Past Medical History  Diagnosis Date  . Migraines   . GERD (gastroesophageal reflux disease)   . Hemorrhoids   . Spina bifida occulta   . S/P colonoscopy March 2011    internal hemorrhoids  . S/P endoscopy March 2011    antral erosions, chronic active gastritis, +H.pylori  . Helicobacter pylori gastritis 2011    s/p treatment with Prevpac  . IBS (irritable bowel syndrome)     Past Surgical History  Procedure Date  . Cholecystectomy   . Tubal ligation   . Dilation and curettage of uterus   . Endometrial ablation   . Egd/tcs 09/2009    Rourk-H.Pylori gastritis (s/p tx), benign sb bx, small hh, hemorrhoids, normal TI    Current Outpatient Prescriptions  Medication Sig Dispense Refill  . baclofen (LIORESAL) 10 MG tablet Take 10 mg by mouth 3 (three) times daily as needed. migraines      . butalbital-acetaminophen-caffeine (FIORICET) 50-325-40 MG per tablet Take 1 tablet by mouth 2 (two) times daily as needed. Pain, migraines      . hyoscyamine (LEVSIN, ANASPAZ) 0.125 MG tablet Take 0.125 mg by mouth every 4 (four) hours as  needed. Migraine Headache      . naproxen (NAPROSYN) 250 MG tablet Take 250 mg by mouth 2 (two) times daily with a meal.        . omeprazole (PRILOSEC) 20 MG capsule TAKE 1 CAPSULE BY MOUTH 30 MINUTES BEFORE BREAKFAST DAILY  30 capsule  11  . hydrocortisone (ANUSOL-HC) 25 MG suppository Place 1 suppository (25 mg total) rectally every 12 (twelve) hours.  20 suppository  0  . polyethylene glycol powder (MIRALAX) powder Take 17 g by mouth daily. For constipation. Hold if you have diarrhea.  527 g  11    Allergies as of 04/26/2012 - Review Complete 04/26/2012  Allergen Reaction Noted  . Bee venom Anaphylaxis 11/06/2011  . Acetaminophen Swelling and Other (See Comments)   . Codeine Other (See Comments)     Family History  Problem Relation Age of Onset  . Colon cancer Maternal Grandmother     History   Social History  . Marital Status: Single    Spouse Name: N/A    Number of Children: 1  . Years of Education: N/A   Occupational History  . disabled     "spinal issues"   Social History Main Topics  . Smoking status: Current Every Day Smoker -- 0.5 packs/day for 18 years    Types: Cigarettes  . Smokeless tobacco: None  . Alcohol Use: No  . Drug Use: No  .  Sexually Active: None   Other Topics Concern  . None   Social History Narrative   Lives w/ grandmother & daughter    Review of Systems: Gen: Denies any fever, chills, sweats, anorexia, fatigue, weakness, malaise, weight loss, and sleep disorder CV: Denies chest pain, angina, palpitations, syncope, orthopnea, PND, peripheral edema, and claudication. Resp: Denies dyspnea at rest, dyspnea with exercise, cough, sputum, wheezing, coughing up blood, and pleurisy. GI: Denies vomiting blood, jaundice, and fecal incontinence.   Denies dysphagia or odynophagia. Derm: Denies rash, itching, dry skin, hives, moles, warts, or unhealing ulcers.  Psych: Denies depression, anxiety, memory loss, suicidal ideation, hallucinations, paranoia,  and confusion. Heme: Denies bruising or enlarged lymph nodes.    Physical Exam: BP 106/72  Pulse 72  Temp 98.1 F (36.7 C) (Temporal)  Ht 5\' 4"  (1.626 m)  Wt 180 lb 6.4 oz (81.829 kg)  BMI 30.97 kg/m2 No LMP recorded. Patient is postmenopausal. General:   Alert,  Well-developed, well-nourished, pleasant and cooperative in NAD. Head:  Normocephalic and atraumatic. Eyes:  Sclera clear, no icterus.   Conjunctiva pink. Mouth:  No deformity or lesions.  Oropharynx pink & moist. Neck:  Supple; no masses or thyromegaly. Heart:  Regular rate and rhythm; no murmurs, clicks, rubs,  or gallops. Abdomen:   Normal bowel sounds.  Soft, nontender and nondistended. No masses, hepatosplenomegaly or hernias noted. No guarding or rebound tenderness.   Msk:  Symmetrical without gross deformities.  Pulses:  Normal pulses noted. Extremities:  Without clubbing or edema. Neurologic:  Alert and  oriented x4;  grossly normal neurologically. Skin:  Intact without significant lesions or rashes. Cervical Nodes:  No significant cervical adenopathy. Psych:  Alert and cooperative. Normal mood and affect.

## 2012-04-26 NOTE — Assessment & Plan Note (Signed)
MiraLax 17 g daily for constipation. Hold if diarrhea. 1-800-quit-now for help quitting smoking Please call Dr. Felecia Shelling about your dark urine and be sure you're drinking plenty of fluids. Office visit in one year with Dr. Jena Gauss

## 2012-04-26 NOTE — Assessment & Plan Note (Signed)
Well controlled on omeprazole 20mg daily.  °

## 2012-04-26 NOTE — Patient Instructions (Addendum)
Continue Prilosec 20 mg daily for acid reflux Anusol HC suppositories in the morning and at bedtime for hemorrhoids MiraLax 17 g daily for constipation. Hold if diarrhea. 1-800-quit-now for help quitting smoking Please call Dr. Felecia Shelling about your dark urine and be sure you're drinking plenty of fluids. Office visit in one year with Dr. Jena Gauss

## 2012-04-26 NOTE — Progress Notes (Signed)
Faxed to PCP

## 2012-04-26 NOTE — Assessment & Plan Note (Signed)
Chronic intermittent constipation along the realm of IBS-C.

## 2012-04-26 NOTE — Assessment & Plan Note (Signed)
Trial Anusol HC suppositories in the morning and at bedtime for hemorrhoids.  If no better, she is to call if back for further evaluation.

## 2012-06-19 ENCOUNTER — Other Ambulatory Visit: Payer: Self-pay | Admitting: Obstetrics & Gynecology

## 2012-06-19 ENCOUNTER — Other Ambulatory Visit (HOSPITAL_COMMUNITY)
Admission: RE | Admit: 2012-06-19 | Discharge: 2012-06-19 | Disposition: A | Discharge status: Home / Self Care | Payer: Medicaid Other | Source: Ambulatory Visit | Attending: Obstetrics & Gynecology | Admitting: Obstetrics & Gynecology

## 2012-06-19 DIAGNOSIS — Z01419 Encounter for gynecological examination (general) (routine) without abnormal findings: Secondary | ICD-10-CM | POA: Insufficient documentation

## 2012-06-19 DIAGNOSIS — Z1151 Encounter for screening for human papillomavirus (HPV): Secondary | ICD-10-CM | POA: Insufficient documentation

## 2012-08-01 ENCOUNTER — Telehealth: Payer: Self-pay

## 2012-08-01 MED ORDER — HYDROCORTISONE ACE-PRAMOXINE 1-1 % RE FOAM: 1.0000 | Freq: Two times a day (BID) | RECTAL | Status: DC

## 2012-08-01 NOTE — Telephone Encounter (Signed)
Pt came by office- she said she was still having problems with her hemorrhoids. Pt was unable to get the suppositories last time because her insurance wouldn't cover it. She is requesting another rx sent to [facility]. Pt will call insurance if they wont pay for it and see what they will pay for and she is aware that if she continues to have problems she needs an ov per KJ.

## 2012-08-26 ENCOUNTER — Other Ambulatory Visit: Payer: Self-pay | Admitting: Urgent Care

## 2012-08-28 NOTE — Telephone Encounter (Signed)
Is she still having issues? Received refill request for proctofoam.

## 2012-08-29 NOTE — Telephone Encounter (Signed)
I refilled it once. If continued issues, please call us.

## 2012-11-30 ENCOUNTER — Other Ambulatory Visit: Payer: Self-pay | Admitting: Gastroenterology

## 2013-04-18 ENCOUNTER — Ambulatory Visit: Payer: Medicaid Other | Admitting: Internal Medicine

## 2013-05-11 ENCOUNTER — Ambulatory Visit (INDEPENDENT_AMBULATORY_CARE_PROVIDER_SITE_OTHER): Payer: Medicaid Other | Admitting: Internal Medicine

## 2013-05-11 VITALS — BP 122/73 | HR 77 | Temp 97.4°F | Ht 64.0 in | Wt 182.4 lb

## 2013-05-11 DIAGNOSIS — K648 Other hemorrhoids: Secondary | ICD-10-CM

## 2013-05-11 DIAGNOSIS — K219 Gastro-esophageal reflux disease without esophagitis: Secondary | ICD-10-CM

## 2013-05-11 DIAGNOSIS — K589 Irritable bowel syndrome without diarrhea: Secondary | ICD-10-CM

## 2013-05-11 MED ORDER — HYOSCYAMINE SULFATE 0.125 MG SL SUBL: 0.1250 mg | SUBLINGUAL_TABLET | Freq: Three times a day (TID) | SUBLINGUAL | Status: DC

## 2013-05-11 NOTE — Progress Notes (Signed)
Primary Care Physician:  Avon Gully, MD Primary Gastroenterologist:  Dr. Jena Gauss  Pre-Procedure History & Physical: HPI:  Alyssa Aguilar is a 42 y.o. female here for followup of hemorrhoids. Intermittent rectal bleeding and itching in the setting of alternating constipation diarrhea. Levsin previously worked very well but not taking it anymore. Anusol didn't help much. Reflux symptoms well controlled on Prilosec. Internal hemorrhoids on 2011 colonoscopy.  Past Medical History  Diagnosis Date  . Migraines   . GERD (gastroesophageal reflux disease)   . Hemorrhoids   . Spina bifida occulta   . S/P colonoscopy March 2011    internal hemorrhoids  . S/P endoscopy March 2011    antral erosions, chronic active gastritis, +H.pylori  . Helicobacter pylori gastritis 2011    s/p treatment with Prevpac  . IBS (irritable bowel syndrome)     Past Surgical History  Procedure Laterality Date  . Cholecystectomy    . Tubal ligation    . Dilation and curettage of uterus    . Endometrial ablation    . Egd/tcs  09/2009    Jeriyah Granlund-H.Pylori gastritis (s/p tx), benign sb bx, small hh, hemorrhoids, normal TI    Prior to Admission medications   Medication Sig Start Date End Date Taking? Authorizing Provider  baclofen (LIORESAL) 10 MG tablet Take 10 mg by mouth 3 (three) times daily as needed. migraines   Yes Historical Provider, MD  butalbital-acetaminophen-caffeine (FIORICET) 50-325-40 MG per tablet Take 1 tablet by mouth 2 (two) times daily as needed. Pain, migraines   Yes Historical Provider, MD  hyoscyamine (LEVSIN, ANASPAZ) 0.125 MG tablet Take 0.125 mg by mouth every 4 (four) hours as needed. Migraine Headache   Yes Historical Provider, MD  naproxen (NAPROSYN) 250 MG tablet Take 250 mg by mouth 2 (two) times daily with a meal.     Yes Historical Provider, MD  omeprazole (PRILOSEC) 20 MG capsule TAKE 1 CAPSULE BY MOUTH 30 MINUTES BEFORE BREAKFAST DAILY 11/30/12  Yes Tiffany Kocher, PA-C  polyethylene  glycol powder (MIRALAX) powder Take 17 g by mouth daily. For constipation. Hold if you have diarrhea. 04/26/12  Yes Joselyn Arrow, NP  PROCTOFOAM [facility] rectal foam PLACE ONE APPLICATORFUL RECTALLY TWICE DAILY 08/26/12   Nira Retort, NP    Allergies as of 05/11/2013 - Review Complete 05/11/2013  Allergen Reaction Noted  . Bee venom Anaphylaxis 11/06/2011  . Acetaminophen Swelling and Other (See Comments)   . Codeine Other (See Comments)     Family History  Problem Relation Age of Onset  . Colon cancer Maternal Grandmother     History   Social History  . Marital Status: Single    Spouse Name: N/A    Number of Children: 1  . Years of Education: N/A   Occupational History  . disabled     "spinal issues"   Social History Main Topics  . Smoking status: Current Every Day Smoker -- 0.50 packs/day for 18 years    Types: Cigarettes  . Smokeless tobacco: Not on file  . Alcohol Use: No  . Drug Use: No  . Sexual Activity: Not on file   Other Topics Concern  . Not on file   Social History Narrative   Lives w/ grandmother & daughter    Review of Systems: See HPI, otherwise negative ROS  Physical Exam: BP 122/73  Pulse 77  Temp(Src) 97.4 F (36.3 C) (Oral)  Ht 5\' 4"  (1.626 m)  Wt 182 lb 6.4 oz (82.736 kg)  BMI 31.29  kg/m2 General:   Alert,  Well-developed, well-nourished, pleasant and cooperative in NAD Skin:  Intact without significant lesions or rashes. Eyes:  Sclera clear, no icterus.   Conjunctiva pink. Ears:  Normal auditory acuity. Nose:  No deformity, discharge,  or lesions. Mouth:  No deformity or lesions. Neck:  Supple; no masses or thyromegaly. No significant cervical adenopathy. Lungs:  Clear throughout to auscultation.   No wheezes, crackles, or rhonchi. No acute distress. Heart:  Regular rate and rhythm; no murmurs, clicks, rubs,  or gallops. Abdomen: Non-distended, normal bowel sounds.  Soft and nontender without appreciable mass or hepatosplenomegaly.   Pulses:  Normal pulses noted. Extremities:  Without clubbing or edema.  Impression: Symptomatic hemorrhoids. 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 GERD well-controlled   Recommendations:   Office hemorrhoidal banding.  Risk, benefits, alternatives have been reviewed. Questions have been answered she is agreeable - plan to see her back in about 3 weeks for this procedure. In the interim, begin Benefiber 2 teaspoons twice daily. Resume Levsin sublingual 0.125 mg tablets a.c. and at bedtime sublingually as needed for abdominal cramps and diarrhea. Avoid straining.  Limit toilet time to 2-3 minutes.  Continue Prilosec.

## 2013-05-11 NOTE — Patient Instructions (Signed)
Begin Levsin 0.125 mg tablets - one under the tongue before meals and at bedtime as needed  Begin Benefiber 2 teaspoons twice daily  Schedule hemorrhoid banding in about 2 weeks

## 2013-05-23 ENCOUNTER — Ambulatory Visit: Payer: Medicaid Other | Admitting: Internal Medicine

## 2013-05-23 ENCOUNTER — Encounter: Payer: Self-pay | Admitting: Internal Medicine

## 2013-05-23 VITALS — BP 118/67 | HR 75 | Temp 97.3°F | Ht 64.0 in | Wt 186.0 lb

## 2013-05-23 DIAGNOSIS — K648 Other hemorrhoids: Secondary | ICD-10-CM

## 2013-05-23 NOTE — Progress Notes (Signed)
[facility] banding procedure note:   It is notable that postprandial abdominal cramps and diarrhea have diminished significantly on Levsin sublingually. The patient presents with symptomatic grade 2 hemorrhoids, unresponsive to maximal medical therapy, requesting rubber band ligation of his/her hemorrhoidal disease. All risks, benefits, and alternative forms of therapy were described and informed consent was obtained.    The decision was made to band the left lateralinternal hemorrhoid, and the [facility] O'Regan System was used to perform band ligation without complication. Somewhat tight anal canal on digital rectal exam prior to band placement necessitated a pea-sized dose of nitroglycerin 0.125% placed anorectally. Digital anorectal examination was then performed to assure proper positioning of the band, and to adjust the banded tissue as required. The band was palpated to be in excellent position. There was no pinching or pain following placement. The patient was discharged home without pain or other issues. Dietary and behavioral recommendations were given.  Patient to return in 2-3 weeks for possible second banding No complications were encountered and the patient tolerated the procedure well.

## 2013-05-23 NOTE — Patient Instructions (Signed)
Continue Benefiber 2 teaspoons twice daily  Continue Levsin sublingually before meals and at bedtime as needed for abdominal cramps and diarrhea  Office visit with [place] in 2-3 weeks decide if another banding is needed

## 2013-06-08 ENCOUNTER — Encounter: Payer: Self-pay | Admitting: Internal Medicine

## 2013-06-20 ENCOUNTER — Encounter: Payer: Medicaid Other | Admitting: Internal Medicine

## 2013-06-25 ENCOUNTER — Ambulatory Visit (INDEPENDENT_AMBULATORY_CARE_PROVIDER_SITE_OTHER): Payer: Medicaid Other | Admitting: Obstetrics & Gynecology

## 2013-06-25 ENCOUNTER — Other Ambulatory Visit (HOSPITAL_COMMUNITY)
Admission: RE | Admit: 2013-06-25 | Discharge: 2013-06-25 | Disposition: A | Discharge status: Home / Self Care | Payer: Medicaid Other | Source: Ambulatory Visit | Attending: Obstetrics & Gynecology | Admitting: Obstetrics & Gynecology

## 2013-06-25 ENCOUNTER — Encounter: Payer: Self-pay | Admitting: Obstetrics & Gynecology

## 2013-06-25 ENCOUNTER — Encounter (INDEPENDENT_AMBULATORY_CARE_PROVIDER_SITE_OTHER): Payer: Self-pay

## 2013-06-25 VITALS — BP 100/80 | Ht 65.0 in | Wt 186.0 lb

## 2013-06-25 DIAGNOSIS — Z0000 Encounter for general adult medical examination without abnormal findings: Secondary | ICD-10-CM

## 2013-06-25 DIAGNOSIS — Z01419 Encounter for gynecological examination (general) (routine) without abnormal findings: Secondary | ICD-10-CM

## 2013-06-25 NOTE — Progress Notes (Signed)
Patient ID: Alyssa Aguilar, female   DOB: 08-04-1970, 42 y.o.   MRN: [id number] Subjective:     Alyssa Aguilar is a 42 y.o. female here for a routine exam.  No LMP recorded. Patient has had an ablation. No obstetric history on file. Birth Control Method:  BTL Menstrual Calendar(currently):  Amenorrheic due to ablation Current complaints: none.   Current acute medical issues:  none   Gynecologic History No LMP recorded. Patient has had an ablation. Last Pap: 2013,  normal Last mammogram: 2013,  normal  Past Medical History  Diagnosis Date  . Migraines   . GERD (gastroesophageal reflux disease)   . Hemorrhoids   . Spina bifida occulta   . S/P colonoscopy March 2011    internal hemorrhoids  . S/P endoscopy March 2011    antral erosions, chronic active gastritis, +H.pylori  . Helicobacter pylori gastritis 2011    s/p treatment with Prevpac  . IBS (irritable bowel syndrome)     Past Surgical History  Procedure Laterality Date  . Cholecystectomy    . Tubal ligation    . Dilation and curettage of uterus    . Endometrial ablation    . Egd/tcs  09/2009    Rourk-H.Pylori gastritis (s/p tx), benign sb bx, small hh, hemorrhoids, normal TI    OB History   Grav Para Term Preterm Abortions TAB SAB Ect Mult Living                  History   Social History  . Marital Status: Single    Spouse Name: N/A    Number of Children: 1  . Years of Education: N/A   Occupational History  . disabled     "spinal issues"   Social History Main Topics  . Smoking status: Current Every Day Smoker -- 0.50 packs/day for 18 years    Types: Cigarettes  . Smokeless tobacco: None  . Alcohol Use: No  . Drug Use: No  . Sexual Activity: None   Other Topics Concern  . None   Social History Narrative   Lives w/ grandmother & daughter    Family History  Problem Relation Age of Onset  . Breast cancer Maternal Grandmother      Review of Systems  Review of Systems  Constitutional: Negative  for fever, chills, weight loss, malaise/fatigue and diaphoresis.  HENT: Negative for hearing loss, ear pain, nosebleeds, congestion, sore throat, neck pain, tinnitus and ear discharge.   Eyes: Negative for blurred vision, double vision, photophobia, pain, discharge and redness.  Respiratory: Negative for cough, hemoptysis, sputum production, shortness of breath, wheezing and stridor.   Cardiovascular: Negative for chest pain, palpitations, orthopnea, claudication, leg swelling and PND.  Gastrointestinal: negative for abdominal pain. Negative for heartburn, nausea, vomiting, diarrhea, constipation, blood in stool and melena.  Genitourinary: Negative for dysuria, urgency, frequency, hematuria and flank pain.  Musculoskeletal: Negative for myalgias, back pain, joint pain and falls.  Skin: Negative for itching and rash.  Neurological: Negative for dizziness, tingling, tremors, sensory change, speech change, focal weakness, seizures, loss of consciousness, weakness and headaches.  Endo/Heme/Allergies: Negative for environmental allergies and polydipsia. Does not bruise/bleed easily.  Psychiatric/Behavioral: Negative for depression, suicidal ideas, hallucinations, memory loss and substance abuse. The patient is not nervous/anxious and does not have insomnia.        Objective:    Physical Exam  Vitals reviewed. Constitutional: She is oriented to person, place, and time. She appears well-developed and well-nourished.  HENT:  Head: Normocephalic and atraumatic.        Right Ear: External ear normal.  Left Ear: External ear normal.  Nose: Nose normal.  Mouth/Throat: Oropharynx is clear and moist.  Eyes: Conjunctivae and EOM are normal. Pupils are equal, round, and reactive to light. Right eye exhibits no discharge. Left eye exhibits no discharge. No scleral icterus.  Neck: Normal range of motion. Neck supple. No tracheal deviation present. No thyromegaly present.  Cardiovascular: Normal rate,  regular rhythm, normal heart sounds and intact distal pulses.  Exam reveals no gallop and no friction rub.   No murmur heard. Respiratory: Effort normal and breath sounds normal. No respiratory distress. She has no wheezes. She has no rales. She exhibits no tenderness.  GI: Soft. Bowel sounds are normal. She exhibits no distension and no mass. There is no tenderness. There is no rebound and no guarding.  Genitourinary:  Breasts no masses skin changes or nipple changes bilaterally      Vulva is normal without lesions Vagina is pink moist without discharge Cervix normal in appearance and pap is done Uterus is normal size shape and contour Adnexa is negative with normal sized ovaries  Musculoskeletal: Normal range of motion. She exhibits no edema and no tenderness.  Neurological: She is alert and oriented to person, place, and time. She has normal reflexes. She displays normal reflexes. No cranial nerve deficit. She exhibits normal muscle tone. Coordination normal.  Skin: Skin is warm and dry. No rash noted. No erythema. No pallor.  Psychiatric: She has a normal mood and affect. Her behavior is normal. Judgment and thought content normal.       Assessment:    Healthy female exam.    Plan:    Mammogram ordered. Follow up in: 1 year.

## 2013-06-25 NOTE — Addendum Note (Signed)
Addended by: Colen Darling on: 06/25/2013 10:45 AM   Modules accepted: Orders

## 2013-07-03 ENCOUNTER — Encounter: Payer: Self-pay | Admitting: Internal Medicine

## 2013-07-03 ENCOUNTER — Encounter (INDEPENDENT_AMBULATORY_CARE_PROVIDER_SITE_OTHER): Payer: Self-pay

## 2013-07-03 ENCOUNTER — Ambulatory Visit (INDEPENDENT_AMBULATORY_CARE_PROVIDER_SITE_OTHER): Payer: Medicaid Other | Admitting: Internal Medicine

## 2013-07-03 VITALS — BP 109/71 | HR 80 | Temp 98.1°F | Ht 63.0 in | Wt 181.5 lb

## 2013-07-03 DIAGNOSIS — K648 Other hemorrhoids: Secondary | ICD-10-CM

## 2013-07-03 NOTE — Patient Instructions (Signed)
Continue daily fiber supplement  Avoid straining and excessive toilet time  Office followup in 2-3 weeks for possible third banding session  Call with any interim problems.

## 2013-07-03 NOTE — Progress Notes (Signed)
[facility] banding procedure note:  Patient is status post one prior band placement-left lateral hemorrhoid. Bleeding itching have definitely improved as patient reports. She's here for a subsequent band placement.  The patient presents with symptomatic hemorrhoids, unresponsive to maximal medical therapy, requesting rubber band ligation of his/her hemorrhoidal disease. All risks, benefits, and alternative forms of therapy were described and informed consent was obtained.  In the left lateral decubitus position, the decision was made to band the right posterior internal hemorrhoid, and the [facility] O'Regan System was used to perform band ligation without complication. Digital anorectal examination was then performed to assure proper positioning of the band, and to adjust the banded tissue as required. Band found to be in excellent position. No pinching or pain following deployment The patient was discharged home without pain or other issues. Dietary and behavioral recommendations were given , along with follow-up instructions. The patient will return i 2-3 weeks for followup and possible additional banding as required.  No complications were encountered and the patient tolerated the procedure well.

## 2013-07-25 ENCOUNTER — Encounter: Payer: Self-pay | Admitting: Internal Medicine

## 2013-08-07 ENCOUNTER — Encounter: Payer: Medicaid Other | Admitting: Internal Medicine

## 2013-08-24 ENCOUNTER — Encounter (INDEPENDENT_AMBULATORY_CARE_PROVIDER_SITE_OTHER): Payer: Self-pay

## 2013-08-24 ENCOUNTER — Ambulatory Visit: Payer: Medicaid Other | Admitting: Internal Medicine

## 2013-08-24 ENCOUNTER — Encounter: Payer: Self-pay | Admitting: Internal Medicine

## 2013-08-24 VITALS — BP 128/81 | HR 99 | Temp 97.4°F | Wt 188.0 lb

## 2013-08-24 DIAGNOSIS — K648 Other hemorrhoids: Secondary | ICD-10-CM

## 2013-08-24 NOTE — Patient Instructions (Signed)
Avoid straining.  Benefiber 2 teaspoons twice daily  Limit toilet time to 2-3 minutes  Call with any interim problems  Schedule followup 6 months (just for a recheck)

## 2013-08-24 NOTE — Progress Notes (Signed)
[place] banding procedure note:  Patient overall much improved after having left lateral hemorrhoid column banded. All of her symptoms(bleeding, itching, pain).    She is here today to have third column of hemorrhoids banded   The [place] was used to perform band ligation  of right anterior column without complication.  Followup digital rectal exam revealed banded in excellent position. No pinching or pain after placement.  The patient was discharged home without pain or other issues. Dietary and behavioral recommendations were given. The patient will return for an office followup in 6 months.   No complications were encountered and the patient tolerated the procedure well.

## 2013-10-08 ENCOUNTER — Other Ambulatory Visit: Payer: Self-pay | Admitting: Internal Medicine

## 2013-11-27 ENCOUNTER — Telehealth: Payer: Self-pay | Admitting: Internal Medicine

## 2013-11-27 ENCOUNTER — Ambulatory Visit: Payer: Medicaid Other | Admitting: Internal Medicine

## 2013-11-27 NOTE — Telephone Encounter (Signed)
Pt was a no show

## 2013-12-20 ENCOUNTER — Encounter (HOSPITAL_COMMUNITY): Payer: Self-pay | Admitting: Emergency Medicine

## 2013-12-20 ENCOUNTER — Emergency Department (HOSPITAL_COMMUNITY)
Admission: EM | Admit: 2013-12-20 | Discharge: 2013-12-20 | Disposition: A | Discharge status: Home / Self Care | Payer: Medicaid Other | Attending: Emergency Medicine | Admitting: Emergency Medicine

## 2013-12-20 DIAGNOSIS — F17200 Nicotine dependence, unspecified, uncomplicated: Secondary | ICD-10-CM | POA: Insufficient documentation

## 2013-12-20 DIAGNOSIS — G43909 Migraine, unspecified, not intractable, without status migrainosus: Secondary | ICD-10-CM | POA: Insufficient documentation

## 2013-12-20 NOTE — ED Notes (Signed)
Headache, for 1 month,  No vomiting. No HI.

## 2013-12-25 ENCOUNTER — Encounter (HOSPITAL_COMMUNITY): Payer: Self-pay | Admitting: Emergency Medicine

## 2013-12-25 DIAGNOSIS — R112 Nausea with vomiting, unspecified: Secondary | ICD-10-CM | POA: Insufficient documentation

## 2013-12-25 DIAGNOSIS — F17200 Nicotine dependence, unspecified, uncomplicated: Secondary | ICD-10-CM | POA: Insufficient documentation

## 2013-12-25 NOTE — ED Notes (Signed)
Pt has nausea and vomiting x1 today.

## 2013-12-26 ENCOUNTER — Emergency Department (HOSPITAL_COMMUNITY)
Admission: EM | Admit: 2013-12-26 | Discharge: 2013-12-26 | Discharge status: Against Medical Advice | Payer: Medicaid Other | Attending: Emergency Medicine | Admitting: Emergency Medicine

## 2013-12-26 MED ORDER — ONDANSETRON 4 MG PO TBDP: 4.0000 mg | ORAL_TABLET | Freq: Once | ORAL | Status: DC

## 2013-12-26 NOTE — ED Notes (Signed)
Patient not in waiting room x 3 for room placement.

## 2013-12-26 NOTE — ED Notes (Signed)
Patient not in room for room placement x 3.

## 2013-12-28 ENCOUNTER — Other Ambulatory Visit: Payer: Self-pay | Admitting: Gastroenterology

## 2014-01-15 ENCOUNTER — Emergency Department (HOSPITAL_COMMUNITY)
Admission: EM | Admit: 2014-01-15 | Discharge: 2014-01-16 | Disposition: A | Discharge status: Home / Self Care | Payer: Medicaid Other | Attending: Emergency Medicine | Admitting: Emergency Medicine

## 2014-01-15 ENCOUNTER — Emergency Department (HOSPITAL_COMMUNITY): Payer: Medicaid Other

## 2014-01-15 ENCOUNTER — Encounter (HOSPITAL_COMMUNITY): Payer: Self-pay | Admitting: Emergency Medicine

## 2014-01-15 DIAGNOSIS — F17200 Nicotine dependence, unspecified, uncomplicated: Secondary | ICD-10-CM | POA: Insufficient documentation

## 2014-01-15 DIAGNOSIS — K589 Irritable bowel syndrome without diarrhea: Secondary | ICD-10-CM | POA: Insufficient documentation

## 2014-01-15 DIAGNOSIS — R079 Chest pain, unspecified: Secondary | ICD-10-CM | POA: Insufficient documentation

## 2014-01-15 DIAGNOSIS — Z8619 Personal history of other infectious and parasitic diseases: Secondary | ICD-10-CM | POA: Insufficient documentation

## 2014-01-15 DIAGNOSIS — K219 Gastro-esophageal reflux disease without esophagitis: Secondary | ICD-10-CM | POA: Insufficient documentation

## 2014-01-15 DIAGNOSIS — Z79899 Other long term (current) drug therapy: Secondary | ICD-10-CM | POA: Insufficient documentation

## 2014-01-15 DIAGNOSIS — Z791 Long term (current) use of non-steroidal anti-inflammatories (NSAID): Secondary | ICD-10-CM | POA: Insufficient documentation

## 2014-01-15 DIAGNOSIS — G43909 Migraine, unspecified, not intractable, without status migrainosus: Secondary | ICD-10-CM | POA: Insufficient documentation

## 2014-01-15 DIAGNOSIS — Q760 Spina bifida occulta: Secondary | ICD-10-CM | POA: Insufficient documentation

## 2014-01-15 DIAGNOSIS — R002 Palpitations: Secondary | ICD-10-CM | POA: Insufficient documentation

## 2014-01-15 LAB — COMPREHENSIVE METABOLIC PANEL
ALBUMIN: 3.7 g/dL (ref 3.5–5.2)
ALT: 28 U/L (ref 0–35)
AST: 26 U/L (ref 0–37)
Alkaline Phosphatase: 103 U/L (ref 39–117)
BUN: 16 mg/dL (ref 6–23)
CO2: 23 mEq/L (ref 19–32)
Calcium: 10 mg/dL (ref 8.4–10.5)
Chloride: 102 mEq/L (ref 96–112)
Creatinine, Ser: 0.88 mg/dL (ref 0.50–1.10)
GFR calc Af Amer: >90 mL/min (ref >90)
GFR, EST NON AFRICAN AMERICAN: 79 mL/min — AB (ref >90)
Glucose, Bld: 156 mg/dL — ABNORMAL HIGH (ref 70–99)
Potassium: 4 mEq/L (ref 3.7–5.3)
Sodium: 140 mEq/L (ref 137–147)
Total Bilirubin: <0.2 mg/dL — ABNORMAL LOW (ref 0.3–1.2)
Total Protein: 7.2 g/dL (ref 6.0–8.3)

## 2014-01-15 LAB — CBC WITH DIFFERENTIAL/PLATELET
BASOS PCT: 0 % (ref 0–1)
Basophils Absolute: 0 10*3/uL (ref 0.0–0.1)
Eosinophils Absolute: 0.3 10*3/uL (ref 0.0–0.7)
Eosinophils Relative: 4 % (ref 0–5)
HCT: 42 % (ref 36.0–46.0)
HEMOGLOBIN: 14.2 g/dL (ref 12.0–15.0)
Lymphocytes Relative: 36 % (ref 12–46)
Lymphs Abs: 2.8 10*3/uL (ref 0.7–4.0)
MCH: 29.7 pg (ref 26.0–34.0)
MCHC: 33.8 g/dL (ref 30.0–36.0)
MCV: 87.9 fL (ref 78.0–100.0)
MONO ABS: 0.5 10*3/uL (ref 0.1–1.0)
Monocytes Relative: 7 % (ref 3–12)
NEUTROS ABS: 4.1 10*3/uL (ref 1.7–7.7)
Neutrophils Relative %: 53 % (ref 43–77)
Platelets: 399 10*3/uL (ref 150–400)
RBC: 4.78 MIL/uL (ref 3.87–5.11)
RDW: 13.2 % (ref 11.5–15.5)
WBC: 7.8 10*3/uL (ref 4.0–10.5)

## 2014-01-15 LAB — TROPONIN I

## 2014-01-15 MED ORDER — SODIUM CHLORIDE 0.9 % IV SOLN
1000.0000 mL | Freq: Once | INTRAVENOUS | Status: AC
  Administered 2014-01-15: 1000 mL via INTRAVENOUS

## 2014-01-15 MED ORDER — SODIUM CHLORIDE 0.9 % IV SOLN
1000.0000 mL | INTRAVENOUS | Status: DC
  Administered 2014-01-16: 1000 mL via INTRAVENOUS

## 2014-01-15 NOTE — ED Notes (Signed)
Chest pain, diaphoretic,  Onset 2 hours pta.

## 2014-01-15 NOTE — ED Provider Notes (Signed)
CSN: [id number]     Arrival date & time 01/15/14  2221 History  This chart was scribed for Hoy Morn, MD by Vernell Barrier, ED scribe. This patient was seen in room APA05/APA05 and the patient's care was started at 11:24 PM.    Chief Complaint  Patient presents with  . Chest Pain   The history is provided by the patient and a relative. No language interpreter was used.   HPI Comments: Alyssa Aguilar is a 43 y.o. female who presents to the Emergency Department complaining of chest pain w/ associated diaphoresis and nausea; onset 2 hours ago. Took a cool shower and upon going outside states heart felt like it was racing; lasted approximately 30-60 minutes. No syncope or near syncope.  Still feels like heart is racing. One episode of watery stool this evening; no hematemesis. Felt some nasal congestion and rhinorrhea yesterday; treated with Alka Seltzer and symptoms resolved. States she has been eating and drinking normally.  No melena or hematochezia described Daughter states she has been more tired lately. Takes medication for heart burn regularly. No hx of HTN, diabetes, or high cholesterol. No alcohol intake. Denies lightheadedness, syncope, chest tightness, vomiting, abdominal pain, fever, or chills.  Past Medical History  Diagnosis Date  . Migraines   . GERD (gastroesophageal reflux disease)   . Hemorrhoids   . Spina bifida occulta   . S/P colonoscopy March 2011    internal hemorrhoids  . S/P endoscopy March 2011    antral erosions, chronic active gastritis, +H.pylori  . Helicobacter pylori gastritis 2011    s/p treatment with Prevpac  . IBS (irritable bowel syndrome)    Past Surgical History  Procedure Laterality Date  . Cholecystectomy    . Tubal ligation    . Dilation and curettage of uterus    . Endometrial ablation    . Egd/tcs  09/2009    Rourk-H.Pylori gastritis (s/p tx), benign sb bx, small hh, hemorrhoids, normal TI  . Hemorrhoid banding     Family History   Problem Relation Age of Onset  . Breast cancer Maternal Grandmother    History  Substance Use Topics  . Smoking status: Current Every Day Smoker -- 0.50 packs/day for 18 years    Types: Cigarettes  . Smokeless tobacco: Not on file  . Alcohol Use: No   OB History   Grav Para Term Preterm Abortions TAB SAB Ect Mult Living                 Review of Systems   A complete 10 system review of systems was obtained and all systems are negative except as noted in the HPI and PMH.   Allergies  Bee venom; Acetaminophen; and Codeine  Home Medications   Prior to Admission medications   Medication Sig Start Date End Date Taking? Authorizing Provider  baclofen (LIORESAL) 10 MG tablet Take 10 mg by mouth 3 (three) times daily as needed. migraines    Historical Provider, MD  butalbital-acetaminophen-caffeine (FIORICET) 50-325-40 MG per tablet Take 1 tablet by mouth 2 (two) times daily as needed. Pain, migraines    Historical Provider, MD  hyoscyamine (LEVSIN SL) 0.125 MG SL tablet PLACE 1 TABLET UNDER THE TONGUE FOUR TIMES DAILY BEFORE MEALS AND EVERY NIGHT AT BEDTIME    Mahala Menghini, PA-C  naproxen (NAPROSYN) 250 MG tablet Take 250 mg by mouth 2 (two) times daily with a meal.      Historical Provider, MD  omeprazole (PRILOSEC) 20  MG capsule TAKE 1 CAPSULE BY MOUTH 30 MINUTES BEFORE BREAKFAST EVERY DAY    Mahala Menghini, PA-C  polyethylene glycol powder (MIRALAX) powder Take 17 g by mouth daily. For constipation. Hold if you have diarrhea. 04/26/12   Andria Meuse, NP  PROCTOFOAM [facility] rectal foam PLACE ONE APPLICATORFUL RECTALLY TWICE DAILY 08/26/12   Orvil Feil, NP   Triage vitals: BP 120/85  Pulse 103  Temp(Src) 98.4 F (36.9 C) (Oral)  Resp 24  Ht 5\' 4"  (1.626 m)  Wt 186 lb (84.369 kg)  BMI 31.91 kg/m2  SpO2 99%  LMP 01/16/2012  Physical Exam  Nursing note and vitals reviewed. Constitutional: She is oriented to person, place, and time. She appears well-developed and well-nourished.  No distress.  HENT:  Head: Normocephalic and atraumatic.  Eyes: EOM are normal.  Neck: Normal range of motion.  Cardiovascular: Normal rate, regular rhythm and normal heart sounds.   Pulmonary/Chest: Effort normal and breath sounds normal.  Abdominal: Soft. She exhibits no distension. There is no tenderness.  Musculoskeletal: Normal range of motion.  Neurological: She is alert and oriented to person, place, and time.  Skin: Skin is warm and dry.  Psychiatric: She has a normal mood and affect. Judgment normal.    ED Course  Procedures (including critical care time) DIAGNOSTIC STUDIES: Oxygen Saturation is 99% on room air, normal by my interpretation.    COORDINATION OF CARE: At 11:30 PM:: Discussed treatment plan with patient which includes blood work and IV fluid. Patient agrees.   Labs Review Results for orders placed during the hospital encounter of 01/15/14  TROPONIN I      Result Value Ref Range   Troponin I <0.30  <0.30 ng/mL  CBC WITH DIFFERENTIAL      Result Value Ref Range   WBC 7.8  4.0 - 10.5 K/uL   RBC 4.78  3.87 - 5.11 MIL/uL   Hemoglobin 14.2  12.0 - 15.0 g/dL   HCT 42.0  36.0 - 46.0 %   MCV 87.9  78.0 - 100.0 fL   MCH 29.7  26.0 - 34.0 pg   MCHC 33.8  30.0 - 36.0 g/dL   RDW 13.2  11.5 - 15.5 %   Platelets 399  150 - 400 K/uL   Neutrophils Relative % 53  43 - 77 %   Neutro Abs 4.1  1.7 - 7.7 K/uL   Lymphocytes Relative 36  12 - 46 %   Lymphs Abs 2.8  0.7 - 4.0 K/uL   Monocytes Relative 7  3 - 12 %   Monocytes Absolute 0.5  0.1 - 1.0 K/uL   Eosinophils Relative 4  0 - 5 %   Eosinophils Absolute 0.3  0.0 - 0.7 K/uL   Basophils Relative 0  0 - 1 %   Basophils Absolute 0.0  0.0 - 0.1 K/uL  COMPREHENSIVE METABOLIC PANEL      Result Value Ref Range   Sodium 140  137 - 147 mEq/L   Potassium 4.0  3.7 - 5.3 mEq/L   Chloride 102  96 - 112 mEq/L   CO2 23  19 - 32 mEq/L   Glucose, Bld 156 (*) 70 - 99 mg/dL   BUN 16  6 - 23 mg/dL   Creatinine, Ser 0.88  0.50 -  1.10 mg/dL   Calcium 10.0  8.4 - 10.5 mg/dL   Total Protein 7.2  6.0 - 8.3 g/dL   Albumin 3.7  3.5 - 5.2 g/dL  AST 26  0 - 37 U/L   ALT 28  0 - 35 U/L   Alkaline Phosphatase 103  39 - 117 U/L   Total Bilirubin <0.2 (*) 0.3 - 1.2 mg/dL   GFR calc non Af Amer 79 (*) >90 mL/min   GFR calc Af Amer >90  >90 mL/min   Dg Chest 2 View  01/15/2014   CLINICAL DATA:  Chest pain.  History of smoking.  EXAM: CHEST  2 VIEW  COMPARISON:  None.  FINDINGS: The lungs are well-aerated and clear. There is no evidence of focal opacification, pleural effusion or pneumothorax.  The heart is normal in size; the mediastinal contour is within normal limits. No acute osseous abnormalities are seen. Clips are noted within the right upper quadrant, reflecting prior cholecystectomy.  IMPRESSION: No acute cardiopulmonary process seen.   Electronically Signed   By: Garald Balding M.D.   On: 01/15/2014 23:18   Imaging Review Dg Chest 2 View  01/15/2014   CLINICAL DATA:  Chest pain.  History of smoking.  EXAM: CHEST  2 VIEW  COMPARISON:  None.  FINDINGS: The lungs are well-aerated and clear. There is no evidence of focal opacification, pleural effusion or pneumothorax.  The heart is normal in size; the mediastinal contour is within normal limits. No acute osseous abnormalities are seen. Clips are noted within the right upper quadrant, reflecting prior cholecystectomy.  IMPRESSION: No acute cardiopulmonary process seen.   Electronically Signed   By: Garald Balding M.D.   On: 01/15/2014 23:18  I personally reviewed the imaging tests through PACS system I reviewed available ER/hospitalization records through the EMR    EKG Interpretation   Date/Time:  Tuesday January 15 2014 22:30:10 EDT Ventricular Rate:  103 PR Interval:  119 QRS Duration: 85 QT Interval:  322 QTC Calculation: 421 R Axis:   64 Text Interpretation:  Sinus tachycardia No old tracing to compare  Confirmed by CAMPOS  MD, Lennette Bihari ([id number]) on 01/15/2014 11:12:34  PM      MDM   Final diagnoses:  Palpitations  Chest pain   Overall well appearing.  Palpitations.  Resolved.  Patient feels much better in the emergency department.  Patient will be referred back to her primary care physician.  She may benefit from a Holter monitor as an outpatient.   I personally performed the services described in this documentation, which was scribed in my presence. The recorded information has been reviewed and is accurate.     Hoy Morn, MD 01/16/14 [phone]

## 2014-01-16 NOTE — Discharge Instructions (Signed)
Chest Pain (Nonspecific) °It is often hard to give a specific diagnosis for the cause of chest pain. There is always a chance that your pain could be related to something serious, such as a heart attack or a blood clot in the lungs. You need to follow up with your health care provider for further evaluation. °CAUSES  °· Heartburn. °· Pneumonia or bronchitis. °· Anxiety or stress. °· Inflammation around your heart (pericarditis) or lung (pleuritis or pleurisy). °· A blood clot in the lung. °· A collapsed lung (pneumothorax). It can develop suddenly on its own (spontaneous pneumothorax) or from trauma to the chest. °· Shingles infection (herpes zoster virus). °The chest wall is composed of bones, muscles, and cartilage. Any of these can be the source of the pain. °· The bones can be bruised by injury. °· The muscles or cartilage can be strained by coughing or overwork. °· The cartilage can be affected by inflammation and become sore (costochondritis). °DIAGNOSIS  °Lab tests or other studies may be needed to find the cause of your pain. Your health care provider may have you take a test called an ambulatory electrocardiogram (ECG). An ECG records your heartbeat patterns over a 24-hour period. You may also have other tests, such as: °· Transthoracic echocardiogram (TTE). During echocardiography, sound waves are used to evaluate how blood flows through your heart. °· Transesophageal echocardiogram (TEE). °· Cardiac monitoring. This allows your health care provider to monitor your heart rate and rhythm in real time. °· Holter monitor. This is a portable device that records your heartbeat and can help diagnose heart arrhythmias. It allows your health care provider to track your heart activity for several days, if needed. °· Stress tests by exercise or by giving medicine that makes the heart beat faster. °TREATMENT  °· Treatment depends on what may be causing your chest pain. Treatment may include: °¨ Acid blockers for  heartburn. °¨ Anti-inflammatory medicine. °¨ Pain medicine for inflammatory conditions. °¨ Antibiotics if an infection is present. °· You may be advised to change lifestyle habits. This includes stopping smoking and avoiding alcohol, caffeine, and chocolate. °· You may be advised to keep your head raised (elevated) when sleeping. This reduces the chance of acid going backward from your stomach into your esophagus. °Most of the time, nonspecific chest pain will improve within 2-3 days with rest and mild pain medicine.  °HOME CARE INSTRUCTIONS  °· If antibiotics were prescribed, take them as directed. Finish them even if you start to feel better. °· For the next few days, avoid physical activities that bring on chest pain. Continue physical activities as directed. °· Do not use any tobacco products, including cigarettes, chewing tobacco, or electronic cigarettes. °· Avoid drinking alcohol. °· Only take medicine as directed by your health care provider. °· Follow your health care provider's suggestions for further testing if your chest pain does not go away. °· Keep any follow-up appointments you made. If you do not go to an appointment, you could develop lasting (chronic) problems with pain. If there is any problem keeping an appointment, call to reschedule. °SEEK MEDICAL CARE IF:  °· Your chest pain does not go away, even after treatment. °· You have a rash with blisters on your chest. °· You have a fever. °SEEK IMMEDIATE MEDICAL CARE IF:  °· You have increased chest pain or pain that spreads to your arm, neck, jaw, back, or abdomen. °· You have shortness of breath. °· You have an increasing cough, or you cough   up blood.  You have severe back or abdominal pain.  You feel nauseous or vomit.  You have severe weakness.  You faint.  You have chills. This is an emergency. Do not wait to see if the pain will go away. Get medical help at once. Call your local emergency services (911 in [place] Do not drive  yourself to the hospital. MAKE SURE YOU:   Understand these instructions.  Will watch your condition.  Will get help right away if you are not doing well or get worse. Document Released: 04/28/2005 Document Revised: 07/24/2013 Document Reviewed: 02/22/2008 [facility] Patient Information 2015 [place], [place]. This information is not intended to replace advice given to you by your health care provider. Make sure you discuss any questions you have with your health care provider.  Palpitations  A palpitation is the feeling that your heartbeat is irregular or is faster than normal. It may feel like your heart is fluttering or skipping a beat. Palpitations are usually not a serious problem. However, in some cases, you may need further medical evaluation. CAUSES  Palpitations can be caused by:  Smoking.  Caffeine or other stimulants, such as diet pills or energy drinks.  Alcohol.  Stress and anxiety.  Strenuous physical activity.  Fatigue.  Certain medicines.  Heart disease, especially if you have a history of arrhythmias. This includes atrial fibrillation, atrial flutter, or supraventricular tachycardia.  An improperly working pacemaker or defibrillator. DIAGNOSIS  To find the cause of your palpitations, your caregiver will take your history and perform a physical exam. Tests may also be done, including:  Electrocardiography (ECG). This test records the heart's electrical activity.  Cardiac monitoring. This allows your caregiver to monitor your heart rate and rhythm in real time.  Holter monitor. This is a portable device that records your heartbeat and can help diagnose heart arrhythmias. It allows your caregiver to track your heart activity for several days, if needed.  Stress tests by exercise or by giving medicine that makes the heart beat faster. TREATMENT  Treatment of palpitations depends on the cause of your symptoms and can vary greatly. Most cases of palpitations do not  require any treatment other than time, relaxation, and monitoring your symptoms. Other causes, such as atrial fibrillation, atrial flutter, or supraventricular tachycardia, usually require further treatment. HOME CARE INSTRUCTIONS   Avoid:  Caffeinated coffee, tea, soft drinks, diet pills, and energy drinks.  Chocolate.  Alcohol.  Stop smoking if you smoke.  Reduce your stress and anxiety. Things that can help you relax include:  A method that measures bodily functions so you can learn to control them (biofeedback).  Yoga.  Meditation.  Physical activity such as swimming, jogging, or walking.  Get plenty of rest and sleep. SEEK MEDICAL CARE IF:   You continue to have a fast or irregular heartbeat beyond 24 hours.  Your palpitations occur more often. SEEK IMMEDIATE MEDICAL CARE IF:  You develop chest pain or shortness of breath.  You have a severe headache.  You feel dizzy, or you faint. MAKE SURE YOU:  Understand these instructions.  Will watch your condition.  Will get help right away if you are not doing well or get worse. Document Released: 07/16/2000 Document Revised: 11/13/2012 Document Reviewed: 09/17/2011 [facility] Patient Information 2014 [place].

## 2014-01-23 ENCOUNTER — Telehealth: Payer: Self-pay | Admitting: Internal Medicine

## 2014-01-23 NOTE — Telephone Encounter (Signed)
Tried to call pt- NA 

## 2014-01-23 NOTE — Telephone Encounter (Signed)
Pt has OV with RMR on 7/14 at 1130 and she is still seeing blood when she wipes and has a foul odor. I offered OV with RMR for this Friday, but she will be going out of town with her church. She gave me a good working number [phone] if the nurse could call her to advise what she can do before her OV with RMR.

## 2014-01-24 NOTE — Telephone Encounter (Signed)
Tried to call pt- LMOM 

## 2014-01-30 NOTE — Telephone Encounter (Signed)
Tried to call pt again, phone number has been disconnected. Will mail letter asking pt to call the office.

## 2014-02-12 ENCOUNTER — Ambulatory Visit (INDEPENDENT_AMBULATORY_CARE_PROVIDER_SITE_OTHER): Payer: Medicaid Other | Admitting: Internal Medicine

## 2014-02-12 ENCOUNTER — Encounter: Payer: Self-pay | Admitting: Internal Medicine

## 2014-02-12 VITALS — BP 113/69 | HR 67 | Temp 97.5°F | Resp 18 | Ht 64.0 in | Wt 178.0 lb

## 2014-02-12 DIAGNOSIS — K219 Gastro-esophageal reflux disease without esophagitis: Secondary | ICD-10-CM

## 2014-02-12 DIAGNOSIS — K589 Irritable bowel syndrome without diarrhea: Secondary | ICD-10-CM

## 2014-02-12 MED ORDER — OMEPRAZOLE 20 MG PO CPDR: 20.0000 mg | DELAYED_RELEASE_CAPSULE | Freq: Every day | ORAL | Status: DC

## 2014-02-12 MED ORDER — HYOSCYAMINE SULFATE 0.125 MG SL SUBL: 0.1250 mg | SUBLINGUAL_TABLET | Freq: Three times a day (TID) | SUBLINGUAL | Status: DC

## 2014-02-12 NOTE — Patient Instructions (Signed)
Resume Levsin 0.125 mg a.c. and at bedtime sublingually as needed-prescription provided.  Resume Prilosec 20 mg daily-prescription provided  Avoid straining.  Benefiber 2 teaspoons twice daily  Limit toilet time to 2-3 minutes  Call with any interim problems  Schedule followup appointment in 2-3 weeks from now

## 2014-02-13 NOTE — Progress Notes (Signed)
Primary Care Physician:  Rosita Fire, MD Primary Gastroenterologist:  Dr. Gala Romney  Pre-Procedure History & Physical: HPI:  Alyssa Aguilar is a 43 y.o. female here for followup. She is recently status post banding of all 3 hemorrhoidal columns. Bleeding, itching pain all subsided. She ran out of Levsin and Prilosec recently. This is associated with a flare of her reflux symptoms and abdominal cramps and intermittent diarrhea.  Past Medical History  Diagnosis Date  . Migraines   . GERD (gastroesophageal reflux disease)   . Hemorrhoids   . Spina bifida occulta   . S/P colonoscopy March 2011    internal hemorrhoids  . S/P endoscopy March 2011    antral erosions, chronic active gastritis, +H.pylori  . Helicobacter pylori gastritis 2011    s/p treatment with Prevpac  . IBS (irritable bowel syndrome)     Past Surgical History  Procedure Laterality Date  . Cholecystectomy    . Tubal ligation    . Dilation and curettage of uterus    . Endometrial ablation    . Egd/tcs  09/2009    Rourk-H.Pylori gastritis (s/p tx), benign sb bx, small hh, hemorrhoids, normal TI  . Hemorrhoid banding      Prior to Admission medications   Medication Sig Start Date End Date Taking? Authorizing Provider  baclofen (LIORESAL) 10 MG tablet Take 10 mg by mouth 3 (three) times daily as needed. migraines   Yes Historical Provider, MD  butalbital-acetaminophen-caffeine (FIORICET) 50-325-40 MG per tablet Take 1 tablet by mouth 2 (two) times daily as needed. Pain, migraines   Yes Historical Provider, MD  hyoscyamine (LEVSIN SL) 0.125 MG SL tablet PLACE 1 TABLET UNDER THE TONGUE FOUR TIMES DAILY BEFORE MEALS AND EVERY NIGHT AT BEDTIME   Yes Mahala Menghini, PA-C  naproxen (NAPROSYN) 250 MG tablet Take 250 mg by mouth 2 (two) times daily with a meal.     Yes Historical Provider, MD  omeprazole (PRILOSEC) 20 MG capsule TAKE 1 CAPSULE BY MOUTH 30 MINUTES BEFORE BREAKFAST EVERY DAY   Yes Mahala Menghini, PA-C    polyethylene glycol powder (MIRALAX) powder Take 17 g by mouth daily. For constipation. Hold if you have diarrhea. 04/26/12  Yes Andria Meuse, NP  PROCTOFOAM [facility] rectal foam PLACE ONE APPLICATORFUL RECTALLY TWICE DAILY 08/26/12  Yes Orvil Feil, NP  hyoscyamine (LEVSIN SL) 0.125 MG SL tablet Place 1 tablet (0.125 mg total) under the tongue 4 (four) times daily -  before meals and at bedtime. Prn diarrhea 02/12/14   Daneil Dolin, MD  omeprazole (PRILOSEC) 20 MG capsule Take 1 capsule (20 mg total) by mouth daily. 02/12/14   Daneil Dolin, MD    Allergies as of 02/12/2014 - Review Complete 02/12/2014  Allergen Reaction Noted  . Bee venom Anaphylaxis 11/06/2011  . Acetaminophen Swelling and Other (See Comments)   . Codeine Other (See Comments)     Family History  Problem Relation Age of Onset  . Breast cancer Maternal Grandmother     History   Social History  . Marital Status: Single    Spouse Name: N/A    Number of Children: 1  . Years of Education: N/A   Occupational History  . disabled     "spinal issues"   Social History Main Topics  . Smoking status: Current Every Day Smoker -- 0.50 packs/day for 18 years    Types: Cigarettes  . Smokeless tobacco: Not on file  . Alcohol Use: No  .  Drug Use: No  . Sexual Activity: Yes    Birth Control/ Protection: Surgical   Other Topics Concern  . Not on file   Social History Narrative   Lives w/ grandmother & daughter    Review of Systems: See HPI, otherwise negative ROS  Physical Exam: BP 113/69  Pulse 67  Temp(Src) 97.5 F (36.4 C) (Oral)  Resp 18  Ht 5\' 4"  (1.626 m)  Wt 178 lb (80.74 kg)  BMI 30.54 kg/m2  LMP 01/16/2012 General:   Alert,  Well-developed, well-nourished, pleasant and cooperative in NAD Skin:  Intact without significant lesions or rashes. Eyes:  Sclera clear, no icterus.   Conjunctiva pink. Ears:  Normal auditory acuity. Nose:  No deformity, discharge,  or lesions. Mouth:  No deformity or  lesions. Neck:  Supple; no masses or thyromegaly. No significant cervical adenopathy. Lungs:  Clear throughout to auscultation.   No wheezes, crackles, or rhonchi. No acute distress. Heart:  Regular rate and rhythm; no murmurs, clicks, rubs,  or gallops. Abdomen: Non-distended, normal bowel sounds.  Soft and nontender without appreciable mass or hepatosplenomegaly.  Pulses:  Normal pulses noted. Extremities:  Without clubbing or edema.  Impression:  43 year old lady status post hemorrhoid banding. By her reports,  she is really in excellent results. She has irritable bowel syndrome-diarrhea-predominant. She has GERD. Flare in symptoms of both being without her Levsin and Prilosec recently.  Recommendations:  Resume Levsin 0.125 mg a.c. and at bedtime sublingually as needed-prescription provided.  Resume Prilosec 20 mg daily-prescription provided  Avoid straining.  Benefiber 2 teaspoons twice daily  Limit toilet time to 2-3 minutes  Call with any interim problems  Schedule followup appointment 3 months       Notice: This dictation was prepared with Dragon dictation along with smaller phrase technology. Any transcriptional errors that result from this process are unintentional and may not be corrected upon review.

## 2014-02-26 ENCOUNTER — Ambulatory Visit (INDEPENDENT_AMBULATORY_CARE_PROVIDER_SITE_OTHER): Payer: Medicaid Other | Admitting: Gastroenterology

## 2014-02-26 ENCOUNTER — Encounter: Payer: Self-pay | Admitting: Gastroenterology

## 2014-02-26 VITALS — BP 103/62 | HR 59 | Temp 97.0°F | Ht 64.0 in | Wt 178.8 lb

## 2014-02-26 DIAGNOSIS — K219 Gastro-esophageal reflux disease without esophagitis: Secondary | ICD-10-CM

## 2014-02-26 DIAGNOSIS — K589 Irritable bowel syndrome without diarrhea: Secondary | ICD-10-CM

## 2014-02-26 NOTE — Patient Instructions (Signed)
1. Continue omeprazole and Levsin as before. 2. Return to the office in one year. 3. Please call Dr. Brynda Greathouse office and request mammogram since you were overdue. He may choose to discuss bladder leakage with him as well.

## 2014-02-26 NOTE — Assessment & Plan Note (Signed)
Better, back on Levsin. Complains of odor on undergarments. Denies fecal incontinence/seepage. Suspect related to urinary incontinence. She denies vaginal discharge. Appears obsessed with cleanliness as outlined above. Suggested she follow up with gyn for urinary incontinence as well as being overdue for mammogram per her report. She agreed.

## 2014-02-26 NOTE — Progress Notes (Signed)
Primary Care Physician: Rosita Fire, MD  Primary Gastroenterologist:  Garfield Cornea, MD   Chief Complaint  Patient presents with  . Follow-up    c/o stomach hurts sometimes    HPI: Alyssa Aguilar is a 43 y.o. female here for short interval followup. She was seen by Dr. work in July 14 and at that time had complained of flare of reflux, abdominal cramps, intermittent diarrhea. She had ran out of her Levsin and Prilosec. Medication was reinitiated. Currently she is taking Levsin BID. No diarrhea. Helps some with the cramps. Takes Miralax at bedtime as needed for constipation. Heartburn better. She is mostly concerned with an odor and she notices on her undergarments. She denies any fecal seepage or incontinence. She reports taking several baths per day because of the odor. Wears boxers for comfort. Complains of urinary incontinence at times, occurs at least daily. Overdue for mammogram, last seen by Dr. Elonda Husky in 06/2013 for yearly GYN exam. Patient also tells me that she washes her tennis shoes and ball caps after each use, admitted she is compulsive regarding clean clothes/shoes.  Current Outpatient Prescriptions  Medication Sig Dispense Refill  . butalbital-acetaminophen-caffeine (FIORICET) 50-325-40 MG per tablet Take 1 tablet by mouth 2 (two) times daily as needed. Pain, migraines      . hyoscyamine (LEVSIN SL) 0.125 MG SL tablet PLACE 1 TABLET UNDER THE TONGUE FOUR TIMES DAILY BEFORE MEALS AND EVERY NIGHT AT BEDTIME  120 tablet  3  . naproxen (NAPROSYN) 250 MG tablet Take 250 mg by mouth 2 (two) times daily with a meal.        . omeprazole (PRILOSEC) 20 MG capsule Take 20 mg by mouth daily.      . polyethylene glycol powder (MIRALAX) powder Take 17 g by mouth daily. For constipation. Hold if you have diarrhea.  527 g  11  . PROCTOFOAM HC rectal foam PLACE ONE APPLICATORFUL RECTALLY TWICE DAILY  10 g  0   No current facility-administered medications for this visit.    Allergies  as of 02/26/2014 - Review Complete 02/26/2014  Allergen Reaction Noted  . Bee venom Anaphylaxis 11/06/2011  . Acetaminophen Swelling and Other (See Comments)   . Codeine Other (See Comments)     ROS:  General: Negative for anorexia, weight loss, fever, chills, fatigue, weakness. ENT: Negative for hoarseness, difficulty swallowing , nasal congestion. CV: Negative for chest pain, angina, palpitations, dyspnea on exertion, peripheral edema.  Respiratory: Negative for dyspnea at rest, dyspnea on exertion, cough, sputum, wheezing.  GI: See history of present illness. GU:  Negative for dysuria, hematuria, urinary incontinence, urinary frequency, nocturnal urination.  Endo: Negative for unusual weight change.    Physical Examination:   BP 103/62  Pulse 59  Temp(Src) 97 F (36.1 C) (Oral)  Ht 5\' 4"  (1.626 m)  Wt 178 lb 12.8 oz (81.103 kg)  BMI 30.68 kg/m2  General: Well-nourished, well-developed in no acute distress.  Eyes: No icterus. Mouth: Oropharyngeal mucosa moist and pink , no lesions erythema or exudate. Lungs: Clear to auscultation bilaterally.  Heart: Regular rate and rhythm, no murmurs rubs or gallops.  Abdomen: Bowel sounds are normal, nontender, nondistended, no hepatosplenomegaly or masses, no abdominal bruits or hernia , no rebound or guarding.   Rectal: no external abnormalities. Negative DRE. No stool noted externally. NO ODOR NOTED.  Extremities: No lower extremity edema. No clubbing or deformities. Neuro: Alert and oriented x 4   Skin: Warm and dry, no jaundice.  Psych: Alert and cooperative, normal mood and affect.

## 2014-02-26 NOTE — Assessment & Plan Note (Signed)
Better back on PPI. Office visit in one year.

## 2014-02-28 NOTE — Progress Notes (Signed)
cc'd to pcp 

## 2014-04-11 ENCOUNTER — Encounter: Payer: Self-pay | Admitting: Internal Medicine

## 2014-07-01 ENCOUNTER — Other Ambulatory Visit: Payer: Medicaid Other | Admitting: Obstetrics & Gynecology

## 2014-07-09 ENCOUNTER — Ambulatory Visit (INDEPENDENT_AMBULATORY_CARE_PROVIDER_SITE_OTHER): Payer: Medicaid Other | Admitting: Obstetrics & Gynecology

## 2014-07-09 ENCOUNTER — Other Ambulatory Visit (HOSPITAL_COMMUNITY)
Admission: RE | Admit: 2014-07-09 | Discharge: 2014-07-09 | Disposition: A | Discharge status: Home / Self Care | Payer: Medicaid Other | Source: Ambulatory Visit | Attending: Obstetrics & Gynecology | Admitting: Obstetrics & Gynecology

## 2014-07-09 ENCOUNTER — Other Ambulatory Visit: Payer: Self-pay | Admitting: Obstetrics & Gynecology

## 2014-07-09 ENCOUNTER — Encounter: Payer: Self-pay | Admitting: Obstetrics & Gynecology

## 2014-07-09 VITALS — BP 110/80 | Ht 64.0 in | Wt 186.0 lb

## 2014-07-09 DIAGNOSIS — Z01419 Encounter for gynecological examination (general) (routine) without abnormal findings: Secondary | ICD-10-CM

## 2014-07-09 DIAGNOSIS — Z0000 Encounter for general adult medical examination without abnormal findings: Secondary | ICD-10-CM

## 2014-07-09 DIAGNOSIS — Z1231 Encounter for screening mammogram for malignant neoplasm of breast: Secondary | ICD-10-CM

## 2014-07-09 NOTE — Progress Notes (Signed)
Patient ID: Alyssa Aguilar, female   DOB: 08/29/1970, 43 y.o.   MRN: [id number] Subjective:     Alyssa Aguilar is a 43 y.o. female here for a routine exam.  No LMP recorded. Patient has had an ablation. No obstetric history on file. Birth Control Method:  BTL + endo ablation 2008 Menstrual Calendar(currently): amenorrheic  Current complaints: none.   Current acute medical issues:  None   Recent Gynecologic History No LMP recorded. Patient has had an ablation. Last Pap: 06/25/2013,  normal Last mammogram: 01/12/2012,  Bi-rads 3, short follow-up  Past Medical History  Diagnosis Date  . Migraines   . GERD (gastroesophageal reflux disease)   . Hemorrhoids   . Spina bifida occulta   . S/P colonoscopy March 2011    internal hemorrhoids  . S/P endoscopy March 2011    antral erosions, chronic active gastritis, +H.pylori  . Helicobacter pylori gastritis 2011    s/p treatment with Prevpac  . IBS (irritable bowel syndrome)     Past Surgical History  Procedure Laterality Date  . Cholecystectomy    . Tubal ligation    . Dilation and curettage of uterus    . Endometrial ablation    . Egd/tcs  09/2009    Rourk-H.Pylori gastritis (s/p tx), benign sb bx, small hh, hemorrhoids, normal TI  . Hemorrhoid banding      OB History    No data available      History   Social History  . Marital Status: Single    Spouse Name: N/A    Number of Children: 1  . Years of Education: N/A   Occupational History  . disabled     "spinal issues"   Social History Main Topics  . Smoking status: Current Every Day Smoker -- 0.50 packs/day for 18 years    Types: Cigarettes  . Smokeless tobacco: None     Comment: 1/2 to one pack a day  . Alcohol Use: No  . Drug Use: No  . Sexual Activity: Yes    Birth Control/ Protection: Surgical   Other Topics Concern  . None   Social History Narrative   Lives w/ grandmother & daughter    Family History  Problem Relation Age of Onset  . Breast cancer  Maternal Grandmother     Current outpatient prescriptions: butalbital-acetaminophen-caffeine (FIORICET) 50-325-40 MG per tablet, Take 1 tablet by mouth 2 (two) times daily as needed. Pain, migraines, Disp: , Rfl: ;  hyoscyamine (LEVSIN SL) 0.125 MG SL tablet, PLACE 1 TABLET UNDER THE TONGUE FOUR TIMES DAILY BEFORE MEALS AND EVERY NIGHT AT BEDTIME, Disp: 120 tablet, Rfl: 3 naproxen (NAPROSYN) 250 MG tablet, Take 250 mg by mouth 2 (two) times daily with a meal.  , Disp: , Rfl: ;  omeprazole (PRILOSEC) 20 MG capsule, Take 20 mg by mouth daily., Disp: , Rfl: ;  polyethylene glycol powder (MIRALAX) powder, Take 17 g by mouth daily. For constipation. Hold if you have diarrhea., Disp: 527 g, Rfl: 11;  PROCTOFOAM HC rectal foam, PLACE ONE APPLICATORFUL RECTALLY TWICE DAILY, Disp: 10 g, Rfl: 0  Review of Systems  Review of Systems  Constitutional: Negative for fever, chills, weight loss, malaise/fatigue and diaphoresis.  HENT: Negative for hearing loss, ear pain, nosebleeds, congestion, sore throat, neck pain, tinnitus and ear discharge.   Eyes: Negative for blurred vision, double vision, photophobia, pain, discharge and redness.  Respiratory: Negative for cough, hemoptysis, sputum production, shortness of breath, wheezing and stridor.   Cardiovascular: Negative for chest  pain, palpitations, orthopnea, claudication, leg swelling and PND.  Gastrointestinal: negative for abdominal pain. Negative for heartburn, nausea, vomiting, diarrhea, constipation, blood in stool and melena.  Genitourinary: Negative for dysuria, urgency, frequency, hematuria and flank pain.  Musculoskeletal: Negative for myalgias, back pain, joint pain and falls.  Skin: Negative for itching and rash.  Neurological: Negative for dizziness, tingling, tremors, sensory change, speech change, focal weakness, seizures, loss of consciousness, weakness and headaches.  Endo/Heme/Allergies: Negative for environmental allergies and polydipsia. Does  not bruise/bleed easily.  Psychiatric/Behavioral: Negative for depression, suicidal ideas, hallucinations, memory loss and substance abuse. The patient is not nervous/anxious and does not have insomnia.        Objective:  Blood pressure 110/80, height 5\' 4"  (1.626 m), weight 186 lb (84.369 kg).   Physical Exam  Vitals reviewed. Constitutional: She is oriented to person, place, and time. She appears well-developed and well-nourished.  HENT:  Head: Normocephalic and atraumatic.        Right Ear: External ear normal.  Left Ear: External ear normal.  Nose: Nose normal.  Mouth/Throat: Oropharynx is clear and moist.  Eyes: Conjunctivae and EOM are normal. Pupils are equal, round, and reactive to light. Right eye exhibits no discharge. Left eye exhibits no discharge. No scleral icterus.  Neck: Normal range of motion. Neck supple. No tracheal deviation present. No thyromegaly present.  Cardiovascular: Normal rate, regular rhythm, normal heart sounds and intact distal pulses.  Exam reveals no gallop and no friction rub.   No murmur heard. Respiratory: Effort normal and breath sounds normal. No respiratory distress. She has no wheezes. She has no rales. She exhibits no tenderness.  GI: Soft. Bowel sounds are normal. She exhibits no distension and no mass. There is no tenderness. There is no rebound and no guarding.  Genitourinary:  Breasts no masses skin changes or nipple changes bilaterally      Vulva is normal without lesions Vagina is pink moist without discharge Cervix normal in appearance and pap is done Uterus is normal size shape and contour Adnexa is negative with normal sized ovaries Musculoskeletal: Normal range of motion. She exhibits no edema and no tenderness.  Neurological: She is alert and oriented to person, place, and time. She has normal reflexes. She displays normal reflexes. No cranial nerve deficit. She exhibits normal muscle tone. Coordination normal.  Skin: Skin is warm  and dry. No rash noted. No erythema. No pallor.  Psychiatric: She has a normal mood and affect. Her behavior is normal. Judgment and thought content normal.       Assessment:    Healthy female exam.    Plan:    Mammogram ordered. Follow up in: 1 year.

## 2014-07-11 LAB — CYTOLOGY - PAP

## 2014-07-16 ENCOUNTER — Other Ambulatory Visit: Payer: Self-pay | Admitting: Obstetrics & Gynecology

## 2014-07-16 ENCOUNTER — Ambulatory Visit (HOSPITAL_COMMUNITY)
Admission: RE | Admit: 2014-07-16 | Discharge: 2014-07-16 | Disposition: A | Discharge status: Home / Self Care | Payer: Medicaid Other | Source: Ambulatory Visit | Attending: Obstetrics & Gynecology | Admitting: Obstetrics & Gynecology

## 2014-07-16 DIAGNOSIS — R229 Localized swelling, mass and lump, unspecified: Principal | ICD-10-CM

## 2014-07-16 DIAGNOSIS — Z1231 Encounter for screening mammogram for malignant neoplasm of breast: Secondary | ICD-10-CM | POA: Insufficient documentation

## 2014-07-16 DIAGNOSIS — R928 Other abnormal and inconclusive findings on diagnostic imaging of breast: Secondary | ICD-10-CM | POA: Insufficient documentation

## 2014-07-16 DIAGNOSIS — IMO0002 Reserved for concepts with insufficient information to code with codable children: Secondary | ICD-10-CM

## 2014-08-13 ENCOUNTER — Telehealth: Payer: Self-pay | Admitting: Obstetrics & Gynecology

## 2014-08-13 MED ORDER — METRONIDAZOLE 500 MG PO TABS: 500.0000 mg | ORAL_TABLET | Freq: Two times a day (BID) | ORAL | Status: DC

## 2014-08-13 NOTE — Telephone Encounter (Signed)
Pt informed of + Trich on Pap from 07/09/2014. Metronidazole e-scribed for her and partner. Pt to not have sex until appt for poc in 3 weeks. Pt verbalized understanding.

## 2014-08-20 ENCOUNTER — Other Ambulatory Visit (HOSPITAL_COMMUNITY): Payer: Self-pay | Admitting: Internal Medicine

## 2014-08-20 DIAGNOSIS — R229 Localized swelling, mass and lump, unspecified: Principal | ICD-10-CM

## 2014-08-20 DIAGNOSIS — IMO0002 Reserved for concepts with insufficient information to code with codable children: Secondary | ICD-10-CM

## 2014-09-10 ENCOUNTER — Encounter: Payer: Self-pay | Admitting: Obstetrics & Gynecology

## 2014-09-10 ENCOUNTER — Ambulatory Visit (INDEPENDENT_AMBULATORY_CARE_PROVIDER_SITE_OTHER): Payer: Medicaid Other | Admitting: Obstetrics & Gynecology

## 2014-09-10 VITALS — BP 106/80 | Wt 188.0 lb

## 2014-09-10 DIAGNOSIS — A5901 Trichomonal vulvovaginitis: Secondary | ICD-10-CM

## 2014-09-10 MED ORDER — TINIDAZOLE 500 MG PO TABS: 500.0000 mg | ORAL_TABLET | Freq: Every day | ORAL | Status: DC

## 2014-09-10 NOTE — Progress Notes (Signed)
Patient ID: Alyssa Aguilar, female   DOB: Jan 27, 1971, 44 y.o.   MRN: [id number] Chief Complaint  Patient presents with  . Follow-up    proof of cure.    HPI Pt with positive trichomonas, here for test of cure Has takem metronidazole twice a day for 12 days Has not had intercourse since october  ROS No burning with urination, frequency or urgency No nausea, vomiting or diarrhea Nor fever chills or other constitutional symptoms   Blood pressure 106/80, weight 188 lb (85.276 kg).  EXAM Abdomen:       Vulva:            normal appearing vulva with no masses, tenderness or lesions Vagina:          normal mucosa, thin grey discharge Cervix:           normal appearance Uterus:           Adnexa:          Rectal:            Hemoccult:     performed                         Wet prep + Heavy Trich infection no yeast no BV  Assessment/Plan:  Persistent trichomonas, treat with tinidazole for 14 days  Follow up in 3 weeks for repeat TOC

## 2014-10-03 ENCOUNTER — Encounter: Payer: Self-pay | Admitting: Obstetrics & Gynecology

## 2014-10-03 ENCOUNTER — Ambulatory Visit (INDEPENDENT_AMBULATORY_CARE_PROVIDER_SITE_OTHER): Payer: Medicaid Other | Admitting: Obstetrics & Gynecology

## 2014-10-03 VITALS — BP 120/90 | HR 80 | Ht 64.0 in | Wt 190.0 lb

## 2014-10-03 DIAGNOSIS — A5901 Trichomonal vulvovaginitis: Secondary | ICD-10-CM | POA: Diagnosis not present

## 2014-10-03 NOTE — Progress Notes (Signed)
Patient ID: Alyssa Aguilar, female   DOB: 06/15/1971, 44 y.o.   MRN: [id number]   Chief Complaint  Patient presents with  . gyn visit    proof of cure.     HPI:    Pt was diagnosed with trichomonas last month.  She took actually twelve days of metronidazole 500 BID and came back in for evaluation of test of cure and she was still positive I assumed she has metronidazole resistant strain so I then treated her with tinidazole 500 mg BID x 14 days She is here today for test of cure.  She a) states she took all her meds and b) has not had intercourse. Additionally it is noted she will not be having intercourse with that partner again She denies any discharge  Problem Pertinent ROS:      See above   Extended ROS:           [place]:             Past Medical History  Diagnosis Date  . Migraines   . GERD (gastroesophageal reflux disease)   . Hemorrhoids   . Spina bifida occulta   . S/P colonoscopy March 2011    internal hemorrhoids  . S/P endoscopy March 2011    antral erosions, chronic active gastritis, +H.pylori  . Helicobacter pylori gastritis 2011    s/p treatment with Prevpac  . IBS (irritable bowel syndrome)     Past Surgical History  Procedure Laterality Date  . Cholecystectomy    . Tubal ligation    . Dilation and curettage of uterus    . Endometrial ablation    . Egd/tcs  09/2009    Rourk-H.Pylori gastritis (s/p tx), benign sb bx, small hh, hemorrhoids, normal TI  . Hemorrhoid banding      OB History    No data available      Allergies  Allergen Reactions  . Bee Venom Anaphylaxis  . Acetaminophen Swelling and Other (See Comments)    Tylenol #3  . Codeine Other (See Comments)    Patient doesn't like. Allergy to Tylenol #3    History   Social History  . Marital Status: Single    Spouse Name: N/A  . Number of Children: 1  . Years of Education: N/A   Occupational History  . disabled     "spinal issues"   Social History Main Topics  . Smoking  status: Current Every Day Smoker -- 0.50 packs/day for 18 years    Types: Cigarettes  . Smokeless tobacco: Not on file     Comment: 1/2 to one pack a day  . Alcohol Use: No  . Drug Use: No  . Sexual Activity: Yes    Birth Control/ Protection: Surgical   Other Topics Concern  . None   Social History Narrative   Lives w/ grandmother & daughter    Family History  Problem Relation Age of Onset  . Breast cancer Maternal Grandmother      Examination:  Vitals:  Blood pressure 120/90, pulse 80, height 5\' 4"  (1.626 m), weight 190 lb (86.183 kg).    Physical Examination:     Vulva NEFG no lesions Vagina scant discharge normal mucosa  Wet Prep:   A sample of vaginal discharge was obtained from the posterior fornix using a cotton swab. 2 drops of saline were placed on a slide and the cotton swab was immersed in the saline. Microscopic evaluation was performed and results were as follows:  Negative  for yeast  Negative for clue cells , consistent with Bacterial vaginosis Negative for trichomonas  Normal WBC population   Whiff test: Negative     Labs were ordered today:  Wet prep performed and findings above Imaging studies were not ordered today.   Lab tests  Were reviewed today:  Previous wet prep result discussed in HPI    Imaging studies were not reviewed today:    Prescription Drug Management:  New Prescriptions: none Renewed Prescriptions:  none Current prescription changes:  none   Impression/Plan(Problem Based): 1.  Trichomonas, resistant to metronidazole      (follow up of a pre-existing problem) : Additional workup is not needed:   Wet prep negative today, no meds needed       Follow Up:   prn

## 2014-10-09 ENCOUNTER — Emergency Department (HOSPITAL_COMMUNITY)
Admission: EM | Admit: 2014-10-09 | Discharge: 2014-10-09 | Discharge status: Against Medical Advice | Payer: Medicaid Other | Attending: Emergency Medicine | Admitting: Emergency Medicine

## 2014-10-09 ENCOUNTER — Encounter (HOSPITAL_COMMUNITY): Payer: Self-pay

## 2014-10-09 DIAGNOSIS — R51 Headache: Secondary | ICD-10-CM | POA: Diagnosis present

## 2014-10-09 DIAGNOSIS — Z720 Tobacco use: Secondary | ICD-10-CM | POA: Diagnosis not present

## 2014-10-09 NOTE — ED Notes (Signed)
Called x3 from triage for room with no answer.

## 2014-10-09 NOTE — ED Notes (Signed)
Pt reports history of migraines, c/o headache and vomiting since this morning.  Reports this headache feels like usual migraine.

## 2015-01-15 ENCOUNTER — Encounter: Payer: Self-pay | Admitting: Internal Medicine

## 2015-01-19 ENCOUNTER — Other Ambulatory Visit: Payer: Self-pay | Admitting: Internal Medicine

## 2015-01-21 ENCOUNTER — Ambulatory Visit (HOSPITAL_COMMUNITY)
Admission: RE | Admit: 2015-01-21 | Discharge: 2015-01-21 | Disposition: A | Discharge status: Home / Self Care | Payer: Medicaid Other | Source: Ambulatory Visit | Attending: Internal Medicine | Admitting: Internal Medicine

## 2015-01-21 DIAGNOSIS — IMO0002 Reserved for concepts with insufficient information to code with codable children: Secondary | ICD-10-CM

## 2015-01-21 DIAGNOSIS — N6489 Other specified disorders of breast: Secondary | ICD-10-CM | POA: Diagnosis not present

## 2015-01-21 DIAGNOSIS — R229 Localized swelling, mass and lump, unspecified: Secondary | ICD-10-CM

## 2015-03-29 ENCOUNTER — Encounter (HOSPITAL_COMMUNITY): Payer: Self-pay | Admitting: Emergency Medicine

## 2015-03-29 ENCOUNTER — Emergency Department (HOSPITAL_COMMUNITY)
Admission: EM | Admit: 2015-03-29 | Discharge: 2015-03-29 | Disposition: A | Discharge status: Home / Self Care | Payer: Medicaid Other | Attending: Emergency Medicine | Admitting: Emergency Medicine

## 2015-03-29 DIAGNOSIS — H6121 Impacted cerumen, right ear: Secondary | ICD-10-CM | POA: Diagnosis not present

## 2015-03-29 DIAGNOSIS — Z8619 Personal history of other infectious and parasitic diseases: Secondary | ICD-10-CM | POA: Diagnosis not present

## 2015-03-29 DIAGNOSIS — H9190 Unspecified hearing loss, unspecified ear: Secondary | ICD-10-CM | POA: Diagnosis not present

## 2015-03-29 DIAGNOSIS — Z720 Tobacco use: Secondary | ICD-10-CM | POA: Insufficient documentation

## 2015-03-29 DIAGNOSIS — H9201 Otalgia, right ear: Secondary | ICD-10-CM | POA: Diagnosis present

## 2015-03-29 DIAGNOSIS — Z79899 Other long term (current) drug therapy: Secondary | ICD-10-CM | POA: Insufficient documentation

## 2015-03-29 DIAGNOSIS — G43909 Migraine, unspecified, not intractable, without status migrainosus: Secondary | ICD-10-CM | POA: Diagnosis not present

## 2015-03-29 DIAGNOSIS — Z791 Long term (current) use of non-steroidal anti-inflammatories (NSAID): Secondary | ICD-10-CM | POA: Diagnosis not present

## 2015-03-29 DIAGNOSIS — K219 Gastro-esophageal reflux disease without esophagitis: Secondary | ICD-10-CM | POA: Diagnosis not present

## 2015-03-29 DIAGNOSIS — K589 Irritable bowel syndrome without diarrhea: Secondary | ICD-10-CM | POA: Insufficient documentation

## 2015-03-29 DIAGNOSIS — Q760 Spina bifida occulta: Secondary | ICD-10-CM | POA: Insufficient documentation

## 2015-03-29 DIAGNOSIS — Z7952 Long term (current) use of systemic steroids: Secondary | ICD-10-CM | POA: Diagnosis not present

## 2015-03-29 DIAGNOSIS — Z792 Long term (current) use of antibiotics: Secondary | ICD-10-CM | POA: Insufficient documentation

## 2015-03-29 NOTE — Discharge Instructions (Signed)
Cerumen Impaction °A cerumen impaction is when the wax in your ear forms a plug. This plug usually causes reduced hearing. Sometimes it also causes an earache or dizziness. Removing a cerumen impaction can be difficult and painful. The wax sticks to the ear canal. The canal is sensitive and bleeds easily. If you try to remove a heavy wax buildup with a cotton tipped swab, you may push it in further. °Irrigation with water, suction, and small ear curettes may be used to clear out the wax. If the impaction is fixed to the skin in the ear canal, ear drops may be needed for a few days to loosen the wax. People who build up a lot of wax frequently can use ear wax removal products available in your local drugstore. °SEEK MEDICAL CARE IF:  °You develop an earache, increased hearing loss, or marked dizziness. °Document Released: 08/26/2004 Document Revised: 10/11/2011 Document Reviewed: 10/16/2009 °Exit[employer]® Patient Information ©2015 [employer]. This information is not intended to replace advice given to you by your health care provider. Make sure you discuss any questions you have with your health care provider. ° °

## 2015-03-29 NOTE — ED Notes (Signed)
Pt reports otalgia and "something crawling" in her R e3ar x 1 week. Pt also c/o "sores on her back."

## 2015-03-30 NOTE — ED Provider Notes (Signed)
CSN: [id number]     Arrival date & time 03/29/15  1431 History   First MD Initiated Contact with Patient 03/29/15 1501     Chief Complaint  Patient presents with  . Foriegn object in ear      (Consider location/radiation/quality/duration/timing/severity/associated sxs/prior Treatment) The history is provided by the patient.   Alyssa Aguilar is a 44 y.o. female presenting with right ear ache and itching sensation for the past week along with decreased hearing acuity.  She has used a qtip and tried flushing her ear at home without relief of symptoms.  She denies fevers, chills and drainage from the ear.    Past Medical History  Diagnosis Date  . Migraines   . GERD (gastroesophageal reflux disease)   . Hemorrhoids   . Spina bifida occulta   . S/P colonoscopy March 2011    internal hemorrhoids  . S/P endoscopy March 2011    antral erosions, chronic active gastritis, +H.pylori  . Helicobacter pylori gastritis 2011    s/p treatment with Prevpac  . IBS (irritable bowel syndrome)    Past Surgical History  Procedure Laterality Date  . Cholecystectomy    . Tubal ligation    . Dilation and curettage of uterus    . Endometrial ablation    . Egd/tcs  09/2009    Rourk-H.Pylori gastritis (s/p tx), benign sb bx, small hh, hemorrhoids, normal TI  . Hemorrhoid banding     Family History  Problem Relation Age of Onset  . Breast cancer Maternal Grandmother    Social History  Substance Use Topics  . Smoking status: Current Every Day Smoker -- 0.50 packs/day for 18 years    Types: Cigarettes  . Smokeless tobacco: None     Comment: 1/2 to one pack a day  . Alcohol Use: No   OB History    Gravida Para Term Preterm AB TAB SAB Ectopic Multiple Living   1 1  1            Review of Systems  Constitutional: Negative for fever.  HENT: Positive for ear pain and hearing loss. Negative for congestion, ear discharge, sore throat and voice change.   Eyes: Negative.   Respiratory: Negative for  chest tightness and shortness of breath.   Cardiovascular: Negative for chest pain.  Gastrointestinal: Negative for nausea and abdominal pain.  Genitourinary: Negative.   Musculoskeletal: Negative for joint swelling, arthralgias and neck pain.  Skin: Negative.  Negative for rash and wound.  Neurological: Negative for dizziness, weakness, light-headedness, numbness and headaches.  Psychiatric/Behavioral: Negative.       Allergies  Bee venom; Acetaminophen; and Codeine  Home Medications   Prior to Admission medications   Medication Sig Start Date End Date Taking? Authorizing Provider  butalbital-acetaminophen-caffeine (FIORICET) 50-325-40 MG per tablet Take 1 tablet by mouth 2 (two) times daily as needed. Pain, migraines    Historical Provider, MD  hyoscyamine (LEVSIN SL) 0.125 MG SL tablet PLACE 1 TABLET UNDER THE TONGUE FOUR TIMES DAILY BEFORE MEALS AND EVERY NIGHT AT BEDTIME    Mahala Menghini, PA-C  metroNIDAZOLE (FLAGYL) 500 MG tablet Take 1 tablet (500 mg total) by mouth 2 (two) times daily. Patient not taking: Reported on 10/03/2014 08/13/14   Florian Buff, MD  naproxen (NAPROSYN) 250 MG tablet Take 250 mg by mouth 2 (two) times daily with a meal.      Historical Provider, MD  omeprazole (PRILOSEC) 20 MG capsule TAKE ONE CAPSULE BY MOUTH EVERY DAY 01/20/15  Carlis Stable, NP  polyethylene glycol powder (MIRALAX) powder Take 17 g by mouth daily. For constipation. Hold if you have diarrhea. 04/26/12   Andria Meuse, NP  PROCTOFOAM [facility] rectal foam PLACE ONE APPLICATORFUL RECTALLY TWICE DAILY 08/26/12   Orvil Feil, NP  tinidazole (TINDAMAX) 500 MG tablet Take 1 tablet (500 mg total) by mouth daily with breakfast. 09/10/14   Florian Buff, MD   BP 119/77 mmHg  Pulse 80  Temp(Src) 97.6 F (36.4 C) (Oral)  Resp 16  Ht 5\' 4"  (1.626 m)  Wt 190 lb (86.183 kg)  BMI 32.60 kg/m2  SpO2 100% Physical Exam  Constitutional: She is oriented to person, place, and time. She appears well-developed  and well-nourished.  HENT:  Head: Normocephalic and atraumatic.  Right Ear: Tympanic membrane and ear canal normal.  Left Ear: Tympanic membrane and ear canal normal.  Nose: No mucosal edema or rhinorrhea.  Mouth/Throat: Uvula is midline, oropharynx is clear and moist and mucous membranes are normal. No oropharyngeal exudate, posterior oropharyngeal edema, posterior oropharyngeal erythema or tonsillar abscesses.  Cerumen impaction of right ear.  Eyes: Conjunctivae are normal.  Cardiovascular: Normal rate and normal heart sounds.   Pulmonary/Chest: Effort normal. No respiratory distress. She has no wheezes. She has no rales.  Abdominal: Soft. There is no tenderness.  Musculoskeletal: Normal range of motion.  Neurological: She is alert and oriented to person, place, and time.  Skin: Skin is warm and dry. No rash noted.  Psychiatric: She has a normal mood and affect.    ED Course  EAR CERUMEN REMOVAL Date/Time: 03/29/2015 4:25 PM Performed by: Evalee Jefferson Authorized by: Evalee Jefferson Consent: Verbal consent obtained. Risks and benefits: risks, benefits and alternatives were discussed Consent given by: patient Patient identity confirmed: verbally with patient Local anesthetic: none Ceruminolytics applied: Ceruminolytics applied prior to the procedure. Location details: right ear Procedure type: irrigation Comments: Large amount of cerumen removed,  Also lots of unidentifiable but organic appearing debris along with one small insect leg suggesting moth?cockroach? Or other small insect.  Unable to identify insect type. Examined post flushing, no outer canal or TM injury.   (including critical care time) Labs Review Labs Reviewed - No data to display  Imaging Review No results found. I have personally reviewed and evaluated these images and lab results as part of my medical decision-making.   EKG Interpretation None      MDM   Final diagnoses:  Cerumen impaction, right     Prn f/u anticipated.      Evalee Jefferson, PA-C 03/30/15 1450  Ripley Fraise, MD 03/30/15 [phone]

## 2015-04-01 ENCOUNTER — Ambulatory Visit (INDEPENDENT_AMBULATORY_CARE_PROVIDER_SITE_OTHER): Payer: Medicaid Other | Admitting: Internal Medicine

## 2015-04-01 ENCOUNTER — Encounter: Payer: Self-pay | Admitting: Internal Medicine

## 2015-04-01 ENCOUNTER — Other Ambulatory Visit: Payer: Self-pay

## 2015-04-01 VITALS — BP 127/81 | HR 88 | Temp 97.5°F | Ht 64.0 in | Wt 179.2 lb

## 2015-04-01 DIAGNOSIS — K5900 Constipation, unspecified: Secondary | ICD-10-CM

## 2015-04-01 DIAGNOSIS — K921 Melena: Secondary | ICD-10-CM

## 2015-04-01 MED ORDER — PEG-KCL-NACL-NASULF-NA ASC-C 100 G PO SOLR: 1.0000 | ORAL | Status: DC

## 2015-04-01 NOTE — Progress Notes (Signed)
Primary Care Physician:  Rosita Fire, MD Primary Gastroenterologist:  Dr. Gala Romney  Pre-Procedure History & Physical: HPI:  Alyssa Aguilar is a 44 y.o. female here for further evaluation of hematochezia. Patient states she sees blood with wiping after a bowel movement from time to time. Some constipation recently occasional straining. No associated abdominal pain or other symptoms. No recent upper GI tract symptoms. No abdominal pain nausea, vomiting dysphagia or odynophagia. Negative colonoscopy over 5 years ago. No family history of colon cancer or polyps.  Past Medical History  Diagnosis Date  . Migraines   . GERD (gastroesophageal reflux disease)   . Hemorrhoids   . Spina bifida occulta   . S/P colonoscopy March 2011    internal hemorrhoids  . S/P endoscopy March 2011    antral erosions, chronic active gastritis, +H.pylori  . Helicobacter pylori gastritis 2011    s/p treatment with Prevpac  . IBS (irritable bowel syndrome)     Past Surgical History  Procedure Laterality Date  . Cholecystectomy    . Tubal ligation    . Dilation and curettage of uterus    . Endometrial ablation    . Egd/tcs  09/2009    Rourk-H.Pylori gastritis (s/p tx), benign sb bx, small hh, hemorrhoids, normal TI  . Hemorrhoid banding      Prior to Admission medications   Medication Sig Start Date End Date Taking? Authorizing Provider  butalbital-acetaminophen-caffeine (FIORICET) 50-325-40 MG per tablet Take 1 tablet by mouth 2 (two) times daily as needed. Pain, migraines   Yes Historical Provider, MD  hyoscyamine (LEVSIN SL) 0.125 MG SL tablet PLACE 1 TABLET UNDER THE TONGUE FOUR TIMES DAILY BEFORE MEALS AND EVERY NIGHT AT BEDTIME   Yes Mahala Menghini, PA-C  naproxen (NAPROSYN) 250 MG tablet Take 250 mg by mouth 2 (two) times daily with a meal.     Yes Historical Provider, MD  omeprazole (PRILOSEC) 20 MG capsule TAKE ONE CAPSULE BY MOUTH EVERY DAY 01/20/15  Yes Carlis Stable, NP  polyethylene glycol powder  (MIRALAX) powder Take 17 g by mouth daily. For constipation. Hold if you have diarrhea. 04/26/12  Yes Andria Meuse, NP  PROCTOFOAM [facility] rectal foam PLACE ONE APPLICATORFUL RECTALLY TWICE DAILY 08/26/12  Yes Orvil Feil, NP  tinidazole (TINDAMAX) 500 MG tablet Take 1 tablet (500 mg total) by mouth daily with breakfast. 09/10/14  Yes Florian Buff, MD  metroNIDAZOLE (FLAGYL) 500 MG tablet Take 1 tablet (500 mg total) by mouth 2 (two) times daily. Patient not taking: Reported on 10/03/2014 08/13/14   Florian Buff, MD    Allergies as of 04/01/2015 - Review Complete 04/01/2015  Allergen Reaction Noted  . Bee venom Anaphylaxis 11/06/2011  . Acetaminophen Swelling and Other (See Comments)   . Codeine Other (See Comments)     Family History  Problem Relation Age of Onset  . Breast cancer Maternal Grandmother     Social History   Social History  . Marital Status: Single    Spouse Name: N/A  . Number of Children: 1  . Years of Education: N/A   Occupational History  . disabled     "spinal issues"   Social History Main Topics  . Smoking status: Current Every Day Smoker -- 0.50 packs/day for 18 years    Types: Cigarettes  . Smokeless tobacco: Not on file     Comment: 1/2 to one pack a day  . Alcohol Use: No  . Drug Use: No  .  Sexual Activity: Yes    Birth Control/ Protection: Surgical   Other Topics Concern  . Not on file   Social History Narrative   Lives w/ grandmother & daughter    Review of Systems: See HPI, otherwise negative ROS  Physical Exam: BP 127/81 mmHg  Pulse 88  Temp(Src) 97.5 F (36.4 C)  Ht 5\' 4"  (1.626 m)  Wt 179 lb 3.2 oz (81.285 kg)  BMI 30.74 kg/m2 General:   Alert,  Well-developed, well-nourished, pleasant and cooperative in NAD Skin:  Intact without significant lesions or rashes. Eyes:  Sclera clear, no icterus.   Conjunctiva pink. Ears:  Normal auditory acuity. Nose:  No deformity, discharge,  or lesions. Mouth:  No deformity or lesions. Neck:   Supple; no masses or thyromegaly. No significant cervical adenopathy. Lungs:  Clear throughout to auscultation.   No wheezes, crackles, or rhonchi. No acute distress. Heart:  Regular rate and rhythm; no murmurs, clicks, rubs,  or gallops. Abdomen: Non-distended, normal bowel sounds.  Soft and nontender without appreciable mass or hepatosplenomegaly.  Pulses:  Normal pulses noted. Extremities:  Without clubbing or edema. Rectal exam:  No external lesions. Good sphincter tone. No mass in the rectal vault. Scant brown stool. Hemoccult negative.    Impression:  Pleasant 44 year old lady with paper hematochezia. Temporally related to episodes of constipation/straining. Likely benign anorectal origin. However, it's been over 5 years since she last had her lower GI tract evaluated.  Recommendations:  Diagnostic colonoscopy in the near future.  The risks, benefits, limitations, alternatives and imponderables have been reviewed with the patient. Questions have been answered. All parties are agreeable.  Further recommendations to follow   Notice: This dictation was prepared with Dragon dictation along with smaller phrase technology. Any transcriptional errors that result from this process are unintentional and may not be corrected upon review.

## 2015-04-01 NOTE — Patient Instructions (Signed)
Schedule diagnostic colonoscopy (hematochezia)  Split Movie prep  2 fleets enemas the morning of the procedure.

## 2015-04-25 ENCOUNTER — Encounter (HOSPITAL_COMMUNITY)
Admission: RE | Disposition: A | Discharge status: Home / Self Care | Payer: Self-pay | Source: Ambulatory Visit | Attending: Internal Medicine

## 2015-04-25 ENCOUNTER — Ambulatory Visit (HOSPITAL_COMMUNITY)
Admission: RE | Admit: 2015-04-25 | Discharge: 2015-04-25 | Disposition: A | Discharge status: Home / Self Care | Payer: Medicaid Other | Source: Ambulatory Visit | Attending: Internal Medicine | Admitting: Internal Medicine

## 2015-04-25 ENCOUNTER — Encounter: Payer: Self-pay | Admitting: Internal Medicine

## 2015-04-25 ENCOUNTER — Telehealth: Payer: Self-pay | Admitting: General Practice

## 2015-04-25 DIAGNOSIS — K921 Melena: Secondary | ICD-10-CM

## 2015-04-25 SURGERY — COLONOSCOPY
Anesthesia: Moderate Sedation

## 2015-04-25 MED ORDER — SODIUM CHLORIDE 0.9 % IV SOLN: INTRAVENOUS | Status: DC

## 2015-04-25 NOTE — OR Nursing (Signed)
Patient arrived for colonoscopy. Stated she drank 1/2 of prep at 2200 and the other 1/2 at 0600 today and has not had a bowel movement yet. Stated she ate chicken yesterday at 1100. Dr. Gala Romney notified and said to cancel procedure for today. Ginger Lovelace at Dr. Roseanne Kaufman office notfiied of the above. Patient instructed to go by Dr. Roseanne Kaufman office for new instructions, new appointment time and new prescription for prep. Patient verbalized understanding.

## 2015-04-25 NOTE — Telephone Encounter (Signed)
APPT MADE AND LETTER SENT  °

## 2015-04-25 NOTE — Telephone Encounter (Signed)
Patient ate dinner the day before.  Per Dr. Gala Romney the patient needs to come back to see an extender within the next couple of weeks.  This is the second time the patient has eaten on the day of her prep.  If the patient can't comply with her bowel prep instructions it may result in dismal from our practice.    Routing to [place] to schedule an office visit.

## 2015-05-08 ENCOUNTER — Other Ambulatory Visit: Payer: Self-pay

## 2015-05-08 ENCOUNTER — Encounter: Payer: Self-pay | Admitting: Nurse Practitioner

## 2015-05-08 ENCOUNTER — Ambulatory Visit (INDEPENDENT_AMBULATORY_CARE_PROVIDER_SITE_OTHER): Payer: Medicaid Other | Admitting: Nurse Practitioner

## 2015-05-08 VITALS — BP 119/81 | HR 107 | Temp 98.1°F | Ht 65.0 in | Wt 179.4 lb

## 2015-05-08 DIAGNOSIS — K625 Hemorrhage of anus and rectum: Secondary | ICD-10-CM | POA: Diagnosis not present

## 2015-05-08 MED ORDER — PEG 3350-KCL-NA BICARB-NACL 420 G PO SOLR: 4000.0000 mL | Freq: Once | ORAL | Status: DC

## 2015-05-08 NOTE — Progress Notes (Signed)
Referring Provider: Rosita Fire, MD Primary Care Physician:  Rosita Fire, MD Primary GI:  Dr. Gala Romney  Chief Complaint  Patient presents with  . Colonoscopy    HPI:   44 year old female presents to reschedule colonoscopy. She presented for last colonoscopy which was scheduled on 04/25/2015 and it was determined that she ate dinner the night before. This is the second time that she had not followed instructions for colonoscopy. Previously completed colonoscopy on 10/09/2009 which found internal hemorrhoids as likely source of rectal bleeding. Was seen in the office 04/01/15 with hematochezia and recommended colonoscopy.   Today she states she did eat diner the night before, she was at a church function and forgot. She lives with her grandma and her daughter who is 55 years old. Her mother lives across the street. She is still having rectal bleeding which is unchanged from last visit. Constipation improved. Denies abdominal pain, N/V, fever, chills, unintentional weight loss. She states she cares for herself at home. She helps care for her grandmother. Denies chest pain, dyspnea, dizziness, lightheadedness, syncope, near syncope. Denies any other upper or lower GI symptoms.  Past Medical History  Diagnosis Date  . Migraines   . GERD (gastroesophageal reflux disease)   . Hemorrhoids   . Spina bifida occulta   . S/P colonoscopy March 2011    internal hemorrhoids  . S/P endoscopy March 2011    antral erosions, chronic active gastritis, +H.pylori  . Helicobacter pylori gastritis 2011    s/p treatment with Prevpac  . IBS (irritable bowel syndrome)     Past Surgical History  Procedure Laterality Date  . Cholecystectomy    . Tubal ligation    . Dilation and curettage of uterus    . Endometrial ablation    . Egd/tcs  09/2009    Rourk-H.Pylori gastritis (s/p tx), benign sb bx, small hh, hemorrhoids, normal TI  . Hemorrhoid banding      Current Outpatient Prescriptions  Medication  Sig Dispense Refill  . butalbital-acetaminophen-caffeine (FIORICET) 50-325-40 MG per tablet Take 1 tablet by mouth 2 (two) times daily as needed. Pain, migraines    . hyoscyamine (LEVSIN SL) 0.125 MG SL tablet PLACE 1 TABLET UNDER THE TONGUE FOUR TIMES DAILY BEFORE MEALS AND EVERY NIGHT AT BEDTIME 120 tablet 3  . naproxen (NAPROSYN) 250 MG tablet Take 250 mg by mouth 2 (two) times daily with a meal.      . omeprazole (PRILOSEC) 20 MG capsule TAKE ONE CAPSULE BY MOUTH EVERY DAY 90 capsule 3  . polyethylene glycol powder (MIRALAX) powder Take 17 g by mouth daily. For constipation. Hold if you have diarrhea. 527 g 11  . PROCTOFOAM HC rectal foam PLACE ONE APPLICATORFUL RECTALLY TWICE DAILY 10 g 0  . metroNIDAZOLE (FLAGYL) 500 MG tablet Take 1 tablet (500 mg total) by mouth 2 (two) times daily. (Patient not taking: Reported on 10/03/2014) 14 tablet 1  . peg 3350 powder (MOVIPREP) 100 G SOLR Take 1 kit (200 g total) by mouth as directed. (Patient not taking: Reported on 05/08/2015) 1 kit 0  . tinidazole (TINDAMAX) 500 MG tablet Take 1 tablet (500 mg total) by mouth daily with breakfast. (Patient not taking: Reported on 05/08/2015) 28 tablet 0   No current facility-administered medications for this visit.    Allergies as of 05/08/2015 - Review Complete 05/08/2015  Allergen Reaction Noted  . Bee venom Anaphylaxis 11/06/2011  . Acetaminophen Swelling and Other (See Comments)   . Codeine Other (See Comments)  Family History  Problem Relation Age of Onset  . Breast cancer Maternal Grandmother     Social History   Social History  . Marital Status: Single    Spouse Name: N/A  . Number of Children: 1  . Years of Education: N/A   Occupational History  . disabled     "spinal issues"   Social History Main Topics  . Smoking status: Current Every Day Smoker -- 0.50 packs/day for 18 years    Types: Cigarettes  . Smokeless tobacco: None     Comment: 1/2 to one pack a day  . Alcohol Use: No  .  Drug Use: No  . Sexual Activity: Yes    Birth Control/ Protection: Surgical   Other Topics Concern  . None   Social History Narrative   Lives w/ grandmother & daughter    Review of Systems: General: Negative for anorexia, weight loss, fever, chills, fatigue, weakness. CV: Negative for chest pain, angina, palpitations, dyspnea on exertion, peripheral edema.  Respiratory: Negative for dyspnea at rest, dyspnea on exertion, cough, sputum, wheezing.  GI: See history of present illness. Endo: Negative for unusual weight change.  Heme: Negative for bruising or bleeding.   Physical Exam: BP 119/81 mmHg  Pulse 107  Temp(Src) 98.1 F (36.7 C) (Oral)  Ht _0  (1.651 m)  Wt 179 lb 6.4 oz (81.375 kg)  BMI 29.85 kg/m2 General:   Alert and oriented. Pleasant and cooperative. Well-nourished and well-developed.  Head:  Normocephalic and atraumatic. Ears:  Normal auditory acuity. Cardiovascular:  S1, S2 present without murmurs appreciated. Normal pulses noted. Extremities without clubbing or edema. Respiratory:  Clear to auscultation bilaterally. No wheezes, rales, or rhonchi. No distress.  Gastrointestinal:  +BS, soft, non-tender and non-distended. No HSM noted. No guarding or rebound. No masses appreciated.  Rectal:  Deferred  Neurologic:  Alert and oriented x4;  grossly normal neurologically. Psych:  Alert and cooperative. Normal mood and affect. Heme/Lymph/Immune: No excessive bruising noted.    05/08/2015 8:59 AM

## 2015-05-08 NOTE — Patient Instructions (Signed)
1. We will schedule your colonoscopy for you. 2. Further recommendations to be based on the results of your procedure.     IT IS VERY IMPORTANT TO FOLLOW YOUR INSTRUCTIONS. DO NOT EAT SOLID FOOD THE DAY BEFORE. ONLY CLEAR LIQUIDS THE DAY BEFORE. TAKE YOUR PREP MEDICINE AS INSTRUCTED.

## 2015-05-09 NOTE — Assessment & Plan Note (Signed)
Continued hematochezia unchanged from prior visit. No other red flag/warning signs/symptoms. Last scheduled TCS cancelled due to patient ate night before the procedure. Cares for her self, lives with her grandmother and daughter, her mother lives across the street. Helps care for her grandmother. Will plan to proceed with colonoscopy. Emphasized the importance of following instructions closely. Patient repeated instructions on not eating for verification. Will print 4 copies of prep instructions: once for her, one for her mother, one for her daughter, and one for her grandmother. Will have extended clear liquids (2 days) and will have staff call the patient's home to remind her to not eat on bother days.   Proceed with TCS with Dr. Gala Romney in near future: the risks, benefits, and alternatives have been discussed with the patient in detail. The patient states understanding and desires to proceed.  Th patient is not on any anticoagulants, chronic pain medications, anxiolytics, and antidepressants. Conscious sedation was used for her last procedure without complication and should be adequate for this procedure.

## 2015-05-12 NOTE — Progress Notes (Signed)
CC'D TO PCP °

## 2015-05-20 ENCOUNTER — Telehealth: Payer: Self-pay

## 2015-05-20 NOTE — Telephone Encounter (Signed)
Noted and thank you

## 2015-05-20 NOTE — Telephone Encounter (Signed)
Talked with patient this morning to make sure that she was doing her prep right. She seams to understand everything.

## 2015-05-21 ENCOUNTER — Ambulatory Visit (HOSPITAL_COMMUNITY)
Admission: RE | Admit: 2015-05-21 | Discharge: 2015-05-21 | Disposition: A | Discharge status: Home / Self Care | Payer: Medicaid Other | Source: Ambulatory Visit | Attending: Internal Medicine | Admitting: Internal Medicine

## 2015-05-21 ENCOUNTER — Encounter (HOSPITAL_COMMUNITY): Payer: Self-pay | Admitting: *Deleted

## 2015-05-21 ENCOUNTER — Encounter (HOSPITAL_COMMUNITY)
Admission: RE | Disposition: A | Discharge status: Home / Self Care | Payer: Self-pay | Source: Ambulatory Visit | Attending: Internal Medicine

## 2015-05-21 DIAGNOSIS — K219 Gastro-esophageal reflux disease without esophagitis: Secondary | ICD-10-CM | POA: Insufficient documentation

## 2015-05-21 DIAGNOSIS — F17210 Nicotine dependence, cigarettes, uncomplicated: Secondary | ICD-10-CM | POA: Diagnosis not present

## 2015-05-21 DIAGNOSIS — K648 Other hemorrhoids: Secondary | ICD-10-CM | POA: Diagnosis not present

## 2015-05-21 DIAGNOSIS — K649 Unspecified hemorrhoids: Secondary | ICD-10-CM

## 2015-05-21 DIAGNOSIS — K921 Melena: Secondary | ICD-10-CM | POA: Insufficient documentation

## 2015-05-21 DIAGNOSIS — Z79899 Other long term (current) drug therapy: Secondary | ICD-10-CM | POA: Diagnosis not present

## 2015-05-21 DIAGNOSIS — K625 Hemorrhage of anus and rectum: Secondary | ICD-10-CM

## 2015-05-21 HISTORY — PX: COLONOSCOPY: SHX5424

## 2015-05-21 SURGERY — COLONOSCOPY
Anesthesia: Moderate Sedation

## 2015-05-21 MED ORDER — MEPERIDINE HCL 100 MG/ML IJ SOLN
INTRAMUSCULAR | Status: DC | PRN
  Administered 2015-05-21: 50 mg via INTRAVENOUS

## 2015-05-21 MED ORDER — MEPERIDINE HCL 100 MG/ML IJ SOLN
INTRAMUSCULAR | Status: AC
  Filled 2015-05-21: qty 2

## 2015-05-21 MED ORDER — MIDAZOLAM HCL 5 MG/5ML IJ SOLN
INTRAMUSCULAR | Status: AC
  Filled 2015-05-21: qty 10

## 2015-05-21 MED ORDER — MIDAZOLAM HCL 5 MG/5ML IJ SOLN
INTRAMUSCULAR | Status: DC | PRN
  Administered 2015-05-21: 1 mg via INTRAVENOUS
  Administered 2015-05-21: 2 mg via INTRAVENOUS
  Administered 2015-05-21: 1 mg via INTRAVENOUS

## 2015-05-21 MED ORDER — SODIUM CHLORIDE 0.9 % IV SOLN
INTRAVENOUS | Status: DC
  Administered 2015-05-21: 1000 mL via INTRAVENOUS

## 2015-05-21 MED ORDER — ONDANSETRON HCL 4 MG/2ML IJ SOLN
INTRAMUSCULAR | Status: DC | PRN
  Administered 2015-05-21: 4 mg via INTRAVENOUS

## 2015-05-21 MED ORDER — STERILE WATER FOR IRRIGATION IR SOLN
Status: DC | PRN
  Administered 2015-05-21: 11:00:00

## 2015-05-21 MED ORDER — ONDANSETRON HCL 4 MG/2ML IJ SOLN
INTRAMUSCULAR | Status: AC
  Filled 2015-05-21: qty 2

## 2015-05-21 NOTE — H&P (View-Only) (Signed)
Referring Provider: Rosita Fire, MD Primary Care Physician:  Rosita Fire, MD Primary GI:  Dr. Gala Romney  Chief Complaint  Patient presents with  . Colonoscopy    HPI:   44 year old female presents to reschedule colonoscopy. She presented for last colonoscopy which was scheduled on 04/25/2015 and it was determined that she ate dinner the night before. This is the second time that she had not followed instructions for colonoscopy. Previously completed colonoscopy on 10/09/2009 which found internal hemorrhoids as likely source of rectal bleeding. Was seen in the office 04/01/15 with hematochezia and recommended colonoscopy.   Today she states she did eat diner the night before, she was at a church function and forgot. She lives with her grandma and her daughter who is 55 years old. Her mother lives across the street. She is still having rectal bleeding which is unchanged from last visit. Constipation improved. Denies abdominal pain, N/V, fever, chills, unintentional weight loss. She states she cares for herself at home. She helps care for her grandmother. Denies chest pain, dyspnea, dizziness, lightheadedness, syncope, near syncope. Denies any other upper or lower GI symptoms.  Past Medical History  Diagnosis Date  . Migraines   . GERD (gastroesophageal reflux disease)   . Hemorrhoids   . Spina bifida occulta   . S/P colonoscopy March 2011    internal hemorrhoids  . S/P endoscopy March 2011    antral erosions, chronic active gastritis, +H.pylori  . Helicobacter pylori gastritis 2011    s/p treatment with Prevpac  . IBS (irritable bowel syndrome)     Past Surgical History  Procedure Laterality Date  . Cholecystectomy    . Tubal ligation    . Dilation and curettage of uterus    . Endometrial ablation    . Egd/tcs  09/2009    Rourk-H.Pylori gastritis (s/p tx), benign sb bx, small hh, hemorrhoids, normal TI  . Hemorrhoid banding      Current Outpatient Prescriptions  Medication  Sig Dispense Refill  . butalbital-acetaminophen-caffeine (FIORICET) 50-325-40 MG per tablet Take 1 tablet by mouth 2 (two) times daily as needed. Pain, migraines    . hyoscyamine (LEVSIN SL) 0.125 MG SL tablet PLACE 1 TABLET UNDER THE TONGUE FOUR TIMES DAILY BEFORE MEALS AND EVERY NIGHT AT BEDTIME 120 tablet 3  . naproxen (NAPROSYN) 250 MG tablet Take 250 mg by mouth 2 (two) times daily with a meal.      . omeprazole (PRILOSEC) 20 MG capsule TAKE ONE CAPSULE BY MOUTH EVERY DAY 90 capsule 3  . polyethylene glycol powder (MIRALAX) powder Take 17 g by mouth daily. For constipation. Hold if you have diarrhea. 527 g 11  . PROCTOFOAM HC rectal foam PLACE ONE APPLICATORFUL RECTALLY TWICE DAILY 10 g 0  . metroNIDAZOLE (FLAGYL) 500 MG tablet Take 1 tablet (500 mg total) by mouth 2 (two) times daily. (Patient not taking: Reported on 10/03/2014) 14 tablet 1  . peg 3350 powder (MOVIPREP) 100 G SOLR Take 1 kit (200 g total) by mouth as directed. (Patient not taking: Reported on 05/08/2015) 1 kit 0  . tinidazole (TINDAMAX) 500 MG tablet Take 1 tablet (500 mg total) by mouth daily with breakfast. (Patient not taking: Reported on 05/08/2015) 28 tablet 0   No current facility-administered medications for this visit.    Allergies as of 05/08/2015 - Review Complete 05/08/2015  Allergen Reaction Noted  . Bee venom Anaphylaxis 11/06/2011  . Acetaminophen Swelling and Other (See Comments)   . Codeine Other (See Comments)  Family History  Problem Relation Age of Onset  . Breast cancer Maternal Grandmother     Social History   Social History  . Marital Status: Single    Spouse Name: N/A  . Number of Children: 1  . Years of Education: N/A   Occupational History  . disabled     "spinal issues"   Social History Main Topics  . Smoking status: Current Every Day Smoker -- 0.50 packs/day for 18 years    Types: Cigarettes  . Smokeless tobacco: None     Comment: 1/2 to one pack a day  . Alcohol Use: No  .  Drug Use: No  . Sexual Activity: Yes    Birth Control/ Protection: Surgical   Other Topics Concern  . None   Social History Narrative   Lives w/ grandmother & daughter    Review of Systems: General: Negative for anorexia, weight loss, fever, chills, fatigue, weakness. CV: Negative for chest pain, angina, palpitations, dyspnea on exertion, peripheral edema.  Respiratory: Negative for dyspnea at rest, dyspnea on exertion, cough, sputum, wheezing.  GI: See history of present illness. Endo: Negative for unusual weight change.  Heme: Negative for bruising or bleeding.   Physical Exam: BP 119/81 mmHg  Pulse 107  Temp(Src) 98.1 F (36.7 C) (Oral)  Ht _0  (1.651 m)  Wt 179 lb 6.4 oz (81.375 kg)  BMI 29.85 kg/m2 General:   Alert and oriented. Pleasant and cooperative. Well-nourished and well-developed.  Head:  Normocephalic and atraumatic. Ears:  Normal auditory acuity. Cardiovascular:  S1, S2 present without murmurs appreciated. Normal pulses noted. Extremities without clubbing or edema. Respiratory:  Clear to auscultation bilaterally. No wheezes, rales, or rhonchi. No distress.  Gastrointestinal:  +BS, soft, non-tender and non-distended. No HSM noted. No guarding or rebound. No masses appreciated.  Rectal:  Deferred  Neurologic:  Alert and oriented x4;  grossly normal neurologically. Psych:  Alert and cooperative. Normal mood and affect. Heme/Lymph/Immune: No excessive bruising noted.    05/08/2015 8:59 AM

## 2015-05-21 NOTE — Discharge Instructions (Signed)
Colonoscopy Discharge Instructions  Read the instructions outlined below and refer to this sheet in the next few weeks. These discharge instructions provide you with general information on caring for yourself after you leave the hospital. Your doctor may also give you specific instructions. While your treatment has been planned according to the most current medical practices available, unavoidable complications occasionally occur. If you have any problems or questions after discharge, call Dr. Gala Romney at [phone]. ACTIVITY  You may resume your regular activity, but move at a slower pace for the next 24 hours.   Take frequent rest periods for the next 24 hours.   Walking will help get rid of the air and reduce the bloated feeling in your belly (abdomen).   No driving for 24 hours (because of the medicine (anesthesia) used during the test).    Do not sign any important legal documents or operate any machinery for 24 hours (because of the anesthesia used during the test).  NUTRITION  Drink plenty of fluids.   You may resume your normal diet as instructed by your doctor.   Begin with a light meal and progress to your normal diet. Heavy or fried foods are harder to digest and may make you feel sick to your stomach (nauseated).   Avoid alcoholic beverages for 24 hours or as instructed.  MEDICATIONS  You may resume your normal medications unless your doctor tells you otherwise.  WHAT YOU CAN EXPECT TODAY  Some feelings of bloating in the abdomen.   Passage of more gas than usual.   Spotting of blood in your stool or on the toilet paper.  IF YOU HAD POLYPS REMOVED DURING THE COLONOSCOPY:  No aspirin products for 7 days or as instructed.   No alcohol for 7 days or as instructed.   Eat a soft diet for the next 24 hours.  FINDING OUT THE RESULTS OF YOUR TEST Not all test results are available during your visit. If your test results are not back during the visit, make an appointment  with your caregiver to find out the results. Do not assume everything is normal if you have not heard from your caregiver or the medical facility. It is important for you to follow up on all of your test results.  SEEK IMMEDIATE MEDICAL ATTENTION IF:  You have more than a spotting of blood in your stool.   Your belly is swollen (abdominal distention).   You are nauseated or vomiting.   You have a temperature over 101.   You have abdominal pain or discomfort that is severe or gets worse throughout the day.   \  Hemorrhoid information provided  Canasa 1 g suppository 1 per rectum nightly for one month  Office visit with [place] in 6 weeks     Hemorrhoids Hemorrhoids are swollen veins around the rectum or anus. There are two types of hemorrhoids:   Internal hemorrhoids. These occur in the veins just inside the rectum. They may poke through to the outside and become irritated and painful.  External hemorrhoids. These occur in the veins outside the anus and can be felt as a painful swelling or hard lump near the anus. CAUSES  Pregnancy.   Obesity.   Constipation or diarrhea.   Straining to have a bowel movement.   Sitting for long periods on the toilet.  Heavy lifting or other activity that caused you to strain.  Anal intercourse. SYMPTOMS   Pain.   Anal itching or irritation.   Rectal bleeding.  Fecal leakage.   Anal swelling.   One or more lumps around the anus.  DIAGNOSIS  Your caregiver may be able to diagnose hemorrhoids by visual examination. Other examinations or tests that may be performed include:   Examination of the rectal area with a gloved hand (digital rectal exam).   Examination of anal canal using a small tube (scope).   A blood test if you have lost a significant amount of blood.  A test to look inside the colon (sigmoidoscopy or colonoscopy). TREATMENT Most hemorrhoids can be treated at home. However, if symptoms do not seem to  be getting better or if you have a lot of rectal bleeding, your caregiver may perform a procedure to help make the hemorrhoids get smaller or remove them completely. Possible treatments include:   Placing a rubber band at the base of the hemorrhoid to cut off the circulation (rubber band ligation).   Injecting a chemical to shrink the hemorrhoid (sclerotherapy).   Using a tool to burn the hemorrhoid (infrared light therapy).   Surgically removing the hemorrhoid (hemorrhoidectomy).   Stapling the hemorrhoid to block blood flow to the tissue (hemorrhoid stapling).  HOME CARE INSTRUCTIONS   Eat foods with fiber, such as whole grains, beans, nuts, fruits, and vegetables. Ask your doctor about taking products with added fiber in them (fibersupplements).  Increase fluid intake. Drink enough water and fluids to keep your urine clear or pale yellow.   Exercise regularly.   Go to the bathroom when you have the urge to have a bowel movement. Do not wait.   Avoid straining to have bowel movements.   Keep the anal area dry and clean. Use wet toilet paper or moist towelettes after a bowel movement.   Medicated creams and suppositories may be used or applied as directed.   Only take over-the-counter or prescription medicines as directed by your caregiver.   Take warm sitz baths for 15-20 minutes, 3-4 times a day to ease pain and discomfort.   Place ice packs on the hemorrhoids if they are tender and swollen. Using ice packs between sitz baths may be helpful.   Put ice in a plastic bag.   Place a towel between your skin and the bag.   Leave the ice on for 15-20 minutes, 3-4 times a day.   Do not use a donut-shaped pillow or sit on the toilet for long periods. This increases blood pooling and pain.  SEEK MEDICAL CARE IF:  You have increasing pain and swelling that is not controlled by treatment or medicine.  You have uncontrolled bleeding.  You have difficulty or you  are unable to have a bowel movement.  You have pain or inflammation outside the area of the hemorrhoids. MAKE SURE YOU:  Understand these instructions.  Will watch your condition.  Will get help right away if you are not doing well or get worse.   This information is not intended to replace advice given to you by your health care provider. Make sure you discuss any questions you have with your health care provider.   Document Released: 07/16/2000 Document Revised: 07/05/2012 Document Reviewed: 05/23/2012 Elsevier Interactive Patient Education [employer].

## 2015-05-21 NOTE — Op Note (Signed)
[facility] [address] [place], [address]   COLONOSCOPY PROCEDURE REPORT  PATIENT: Alyssa, Aguilar  MR#: [id number] BIRTHDATE: 09/13/1970 , 89  yrs. old GENDER: female ENDOSCOPIST: R.  Garfield Cornea, MD FACP [facility] REFERRED [id number] Legrand Rams, M.D. PROCEDURE DATE:  2015/05/27 PROCEDURE:   Colonoscopy, diagnostic INDICATIONS:Paper hematochezia. MEDICATIONS: Versed 4 mg IV and Demerol 50 mg IV in divided doses. Zofran 4 mg IV. ASA CLASS:       Class II  CONSENT: The risks, benefits, alternatives and imponderables including but not limited to bleeding, perforation as well as the possibility of a missed lesion have been reviewed.  The potential for biopsy, lesion removal, etc. have also been discussed. Questions have been answered.  All parties agreeable.  Please see the history and physical in the medical record for more information.  DESCRIPTION OF PROCEDURE:   After the risks benefits and alternatives of the procedure were thoroughly explained, informed consent was obtained.  The digital rectal exam revealed no abnormalities of the rectum.   The EC-[id number] ([id number])  endoscope was introduced through the anus and advanced to the cecum, which was identified by both the appendix and ileocecal valve. No adverse events experienced.   The quality of the prep was adequate  The instrument was then slowly withdrawn as the colon was fully examined. Estimated blood loss is zero unless otherwise noted in this procedure report.      COLON FINDINGS: Minimal anal canal hemorrhoids; otherwise, normal-appearing rectal mucosa.  Normal-appearing colonic mucosa.  Initially, quite a bit of a viscous colonic effluent throughout the colon.  I spent quite a bit of time spent washing and lavaging the effluent out to gain an adequate preparation.  Colonoscopy felt to have been adequate today.  Retroflexion was performed. .  Withdrawal time=8 minutes 0 seconds.  The scope was  withdrawn and the procedure completed. COMPLICATIONS: There were no immediate complications.  ENDOSCOPIC IMPRESSION: Anal canal hemorrhoids?"likely source of hematochezia; otherwise, normal colonoscopy  RECOMMENDATIONS: Hemorrhoid information provided. One-month course of Canasa suppositories 1 per rectum at bedtime. Office visit with [place] in 6 weeks to reassess.  eSigned:  R. Garfield Cornea, MD Rosalita Chessman [facility] 27-May-2015 12:01 PM   cc:  CPT CODES: ICD CODES:  The ICD and CPT codes recommended by this software are interpretations from the data that the clinical staff has captured with the software.  The verification of the translation of this report to the ICD and CPT codes and modifiers is the sole responsibility of the health care institution and practicing physician where this report was generated.  [facility]. will not be held responsible for the validity of the ICD and CPT codes included on this report.  AMA assumes no liability for data contained or not contained herein. CPT is a Designer, television/film set of the [employer].

## 2015-05-21 NOTE — Interval H&P Note (Signed)
History and Physical Interval Note:  05/21/2015 11:24 AM  Alyssa Aguilar  has presented today for surgery, with the diagnosis of rectal bleeding  The various methods of treatment have been discussed with the patient and family. After consideration of risks, benefits and other options for treatment, the patient has consented to  Procedure(s) with comments: COLONOSCOPY (N/A) - 1330 - moved to 12:00 - Ginger called pt  as a surgical intervention .  The patient's history has been reviewed, patient examined, no change in status, stable for surgery.  I have reviewed the patient's chart and labs.  Questions were answered to the patient's satisfaction.     Alyssa Aguilar  No change. The risks, benefits, limitations, alternatives and imponderables have been reviewed with the patient. Questions have been answered. All parties are agreeable.

## 2015-05-23 ENCOUNTER — Encounter (HOSPITAL_COMMUNITY): Payer: Self-pay | Admitting: Internal Medicine

## 2015-06-16 ENCOUNTER — Other Ambulatory Visit (HOSPITAL_COMMUNITY): Payer: Self-pay | Admitting: Internal Medicine

## 2015-06-16 DIAGNOSIS — Z09 Encounter for follow-up examination after completed treatment for conditions other than malignant neoplasm: Secondary | ICD-10-CM

## 2015-07-07 ENCOUNTER — Encounter: Payer: Self-pay | Admitting: Nurse Practitioner

## 2015-07-07 ENCOUNTER — Ambulatory Visit (INDEPENDENT_AMBULATORY_CARE_PROVIDER_SITE_OTHER): Payer: Medicaid Other | Admitting: Nurse Practitioner

## 2015-07-07 VITALS — BP 111/71 | HR 86 | Temp 97.1°F | Ht 64.0 in | Wt 180.4 lb

## 2015-07-07 DIAGNOSIS — K625 Hemorrhage of anus and rectum: Secondary | ICD-10-CM | POA: Diagnosis not present

## 2015-07-07 DIAGNOSIS — K649 Unspecified hemorrhoids: Secondary | ICD-10-CM

## 2015-07-07 NOTE — Assessment & Plan Note (Signed)
Rectal bleeding improved after Canasa suppositories. She still has some left, recommend continue to completion. Return for follow-up in 6 months or sooner if signs or symptoms return or worsen. Can consider Anusol as needed if rectal bleeding resumes given hemorrhoid symptoms.

## 2015-07-07 NOTE — Patient Instructions (Signed)
1. Continue your suppositories and other complete. 2. Return for follow-up in 6 months. 3. If he starts feeling worse give [place] a call and we can see you sooner. 4. Have a Merry Christmas!

## 2015-07-07 NOTE — Progress Notes (Signed)
Referring Provider: Rosita Fire, MD Primary Care Physician:  Rosita Fire, MD Primary GI:  Dr.   Chief Complaint  Patient presents with  . Follow-up    HPI:   44 year old female presents for follow-up on rectal bleeding and status post colonoscopy. She was last seen in our office 05/08/2015 for continued rectal bleeding and was rescheduled for colonoscopy as her previous one was canceled due to eating during the prep. Endoscopy completed 05/21/2015 with adequate prep. Findings include anal canal hemorrhoids as likely source of hematochezia, otherwise normal colonoscopy. One-month course of Canasa suppositories 1 rectum at bedtime.  Today she states her rectal bleeding has improved with the suppositories. Occasionally has bleeding after a bowel movement. Still has a few Canasa suppositories left. Has fecal urgency with certain foods. Minimal abdominal pain. Denies fever, chills, nausea, vomiting. Denies chest pain, dyspnea, dizziness, lightheadedness, syncope, near syncope. Denies any other upper or lower GI symptoms.  Past Medical History  Diagnosis Date  . Migraines   . GERD (gastroesophageal reflux disease)   . Hemorrhoids   . Spina bifida occulta   . S/P colonoscopy March 2011    internal hemorrhoids  . S/P endoscopy March 2011    antral erosions, chronic active gastritis, +H.pylori  . Helicobacter pylori gastritis 2011    s/p treatment with Prevpac  . IBS (irritable bowel syndrome)     Past Surgical History  Procedure Laterality Date  . Cholecystectomy    . Tubal ligation    . Dilation and curettage of uterus    . Endometrial ablation    . Egd/tcs  09/2009    Rourk-H.Pylori gastritis (s/p tx), benign sb bx, small hh, hemorrhoids, normal TI  . Hemorrhoid banding    . Colonoscopy N/A 05/21/2015    [id number] canal hemorrhoids otherwise normal    Current Outpatient Prescriptions  Medication Sig Dispense Refill  . butalbital-acetaminophen-caffeine (FIORICET)  50-325-40 MG per tablet Take 1 tablet by mouth 2 (two) times daily as needed. Pain, migraines    . CANASA 1000 MG suppository UNW AND I 1 SUP REC  QHS  1  . hyoscyamine (LEVSIN SL) 0.125 MG SL tablet PLACE 1 TABLET UNDER THE TONGUE FOUR TIMES DAILY BEFORE MEALS AND EVERY NIGHT AT BEDTIME 120 tablet 3  . naproxen (NAPROSYN) 250 MG tablet Take 250 mg by mouth 2 (two) times daily with a meal.      . omeprazole (PRILOSEC) 20 MG capsule TAKE ONE CAPSULE BY MOUTH EVERY DAY 90 capsule 3  . polyethylene glycol powder (MIRALAX) powder Take 17 g by mouth daily. For constipation. Hold if you have diarrhea. 527 g 11  . PROCTOFOAM HC rectal foam PLACE ONE APPLICATORFUL RECTALLY TWICE DAILY 10 g 0   No current facility-administered medications for this visit.    Allergies as of 07/07/2015 - Review Complete 07/07/2015  Allergen Reaction Noted  . Bee venom Anaphylaxis 11/06/2011  . Acetaminophen Swelling and Other (See Comments)   . Codeine Other (See Comments)     Family History  Problem Relation Age of Onset  . Breast cancer Maternal Grandmother     Social History   Social History  . Marital Status: Single    Spouse Name: N/A  . Number of Children: 1  . Years of Education: N/A   Occupational History  . disabled     "spinal issues"   Social History Main Topics  . Smoking status: Current Every Day Smoker -- 0.50 packs/day for 18 years    Types: Cigarettes  .  Smokeless tobacco: None     Comment: 1/2 to one pack a day  . Alcohol Use: No  . Drug Use: No  . Sexual Activity: Yes    Birth Control/ Protection: Surgical   Other Topics Concern  . None   Social History Narrative   Lives w/ grandmother & daughter    Review of Systems: General: Negative for anorexia, weight loss, fever, chills, fatigue, weakness. Eyes: Negative for vision changes.  ENT: Negative for hoarseness, difficulty swallowing. CV: Negative for chest pain, angina, palpitations, peripheral edema.  Respiratory:  Negative for dyspnea at rest, cough, sputum, wheezing.  GI: See history of present illness. Endo: Negative for unusual weight change.  Heme: Negative for bruising or bleeding.   Physical Exam: BP 111/71 mmHg  Pulse 86  Temp(Src) 97.1 F (36.2 C)  Ht 5\' 4"  (1.626 m)  Wt 180 lb 6.4 oz (81.829 kg)  BMI 30.95 kg/m2 General:   Alert and oriented. Pleasant and cooperative. Well-nourished and well-developed.  Head:  Normocephalic and atraumatic. Cardiovascular:  S1, S2 present without murmurs appreciated. Extremities without clubbing or edema. Respiratory:  Clear to auscultation bilaterally. No wheezes, rales, or rhonchi. No distress.  Gastrointestinal:  +BS, soft, non-tender and non-distended. No HSM noted. No guarding or rebound. No masses appreciated.  Rectal:  Deferred  Psych:  Alert and cooperative. Normal mood and affect.    07/07/2015 10:05 AM

## 2015-07-07 NOTE — Assessment & Plan Note (Signed)
Hemorrhoids noted on colonoscopy, not currently bothering her. Can consider Anusol cream or suppositories as needed. Return for follow-up in 6 months or sooner if signs or symptoms worsen or return.

## 2015-07-07 NOTE — Progress Notes (Signed)
CC'ED TO PCP 

## 2015-07-11 ENCOUNTER — Encounter (HOSPITAL_COMMUNITY): Payer: Self-pay | Admitting: Emergency Medicine

## 2015-07-11 ENCOUNTER — Emergency Department (HOSPITAL_COMMUNITY)
Admission: EM | Admit: 2015-07-11 | Discharge: 2015-07-11 | Discharge status: Against Medical Advice | Payer: Medicaid Other | Attending: Emergency Medicine | Admitting: Emergency Medicine

## 2015-07-11 DIAGNOSIS — F17210 Nicotine dependence, cigarettes, uncomplicated: Secondary | ICD-10-CM | POA: Diagnosis not present

## 2015-07-11 DIAGNOSIS — R109 Unspecified abdominal pain: Secondary | ICD-10-CM | POA: Insufficient documentation

## 2015-07-11 NOTE — ED Notes (Signed)
Onset this morning abdominal pain, denies vomiting or diarrhea

## 2015-07-11 NOTE — ED Notes (Signed)
Unable to locate patient. Pt thought to have went AMA, seen leaving by Delrae Alfred, ED tech

## 2015-07-29 ENCOUNTER — Ambulatory Visit (HOSPITAL_COMMUNITY)
Admission: RE | Admit: 2015-07-29 | Discharge: 2015-07-29 | Disposition: A | Discharge status: Home / Self Care | Payer: Medicaid Other | Source: Ambulatory Visit | Attending: Internal Medicine | Admitting: Internal Medicine

## 2015-07-29 ENCOUNTER — Encounter (HOSPITAL_COMMUNITY): Payer: Medicaid Other

## 2015-07-29 DIAGNOSIS — Z09 Encounter for follow-up examination after completed treatment for conditions other than malignant neoplasm: Secondary | ICD-10-CM | POA: Diagnosis not present

## 2015-07-29 DIAGNOSIS — N6489 Other specified disorders of breast: Secondary | ICD-10-CM | POA: Diagnosis not present

## 2015-09-09 ENCOUNTER — Encounter (HOSPITAL_COMMUNITY): Payer: Self-pay

## 2015-09-09 ENCOUNTER — Emergency Department (HOSPITAL_COMMUNITY)
Admission: EM | Admit: 2015-09-09 | Discharge: 2015-09-09 | Disposition: A | Discharge status: Home / Self Care | Payer: Medicaid Other | Attending: Emergency Medicine | Admitting: Emergency Medicine

## 2015-09-09 ENCOUNTER — Emergency Department (HOSPITAL_COMMUNITY): Payer: Medicaid Other

## 2015-09-09 DIAGNOSIS — K219 Gastro-esophageal reflux disease without esophagitis: Secondary | ICD-10-CM | POA: Diagnosis not present

## 2015-09-09 DIAGNOSIS — G43909 Migraine, unspecified, not intractable, without status migrainosus: Secondary | ICD-10-CM | POA: Diagnosis not present

## 2015-09-09 DIAGNOSIS — Z8619 Personal history of other infectious and parasitic diseases: Secondary | ICD-10-CM | POA: Insufficient documentation

## 2015-09-09 DIAGNOSIS — R2241 Localized swelling, mass and lump, right lower limb: Secondary | ICD-10-CM | POA: Insufficient documentation

## 2015-09-09 DIAGNOSIS — F17210 Nicotine dependence, cigarettes, uncomplicated: Secondary | ICD-10-CM | POA: Diagnosis not present

## 2015-09-09 DIAGNOSIS — Z791 Long term (current) use of non-steroidal anti-inflammatories (NSAID): Secondary | ICD-10-CM | POA: Insufficient documentation

## 2015-09-09 DIAGNOSIS — M79661 Pain in right lower leg: Secondary | ICD-10-CM | POA: Diagnosis present

## 2015-09-09 DIAGNOSIS — M25571 Pain in right ankle and joints of right foot: Secondary | ICD-10-CM | POA: Diagnosis not present

## 2015-09-09 DIAGNOSIS — Q760 Spina bifida occulta: Secondary | ICD-10-CM | POA: Insufficient documentation

## 2015-09-09 DIAGNOSIS — Z7952 Long term (current) use of systemic steroids: Secondary | ICD-10-CM | POA: Insufficient documentation

## 2015-09-09 MED ORDER — KETOROLAC TROMETHAMINE 60 MG/2ML IM SOLN
30.0000 mg | Freq: Once | INTRAMUSCULAR | Status: AC
  Administered 2015-09-09: 30 mg via INTRAMUSCULAR
  Filled 2015-09-09: qty 2

## 2015-09-09 MED ORDER — PREDNISONE 10 MG PO TABS: 20.0000 mg | ORAL_TABLET | Freq: Two times a day (BID) | ORAL | Status: DC

## 2015-09-09 NOTE — ED Notes (Signed)
Pt co pain in r lower leg and ankle since Sunday.  Pt has small wounds and scabs to lower legs.  R ankle swollen.  Pedal pulse present.  Denies injury.

## 2015-09-09 NOTE — ED Provider Notes (Signed)
CSN: [id number]     Arrival date & time 09/09/15  1641 History   First MD Initiated Contact with Patient 09/09/15 1754     Chief Complaint  Patient presents with  . Leg Pain     (Consider location/radiation/quality/duration/timing/severity/associated sxs/prior Treatment) Patient is a 45 y.o. female presenting with leg pain. The history is provided by the patient.  Leg Pain Location:  Ankle and leg Injury: no   Leg location:  L lower leg Ankle location:  R ankle Pain details:    Timing:  Constant   Progression:  Worsening Associated symptoms: swelling    Alyssa Aguilar is a 45 y.o. female who presents to the ED with right lower leg and ankle pain that started 3 days ago. She complains of swelling and has scabs to the lower leg. She denies injury. Patient reports that Dr. Legrand Rams gives her hydrocodone for her right knee and leg pain but this pain in the ankle with the swelling is new. Patient also reports taking Excedrin Powder for pain. Pain increases with bearing weight. Patient walked to the hospital today.   Past Medical History  Diagnosis Date  . Migraines   . GERD (gastroesophageal reflux disease)   . Hemorrhoids   . Spina bifida occulta   . S/P colonoscopy March 2011    internal hemorrhoids  . S/P endoscopy March 2011    antral erosions, chronic active gastritis, +H.pylori  . Helicobacter pylori gastritis 2011    s/p treatment with Prevpac  . IBS (irritable bowel syndrome)    Past Surgical History  Procedure Laterality Date  . Cholecystectomy    . Tubal ligation    . Dilation and curettage of uterus    . Endometrial ablation    . Egd/tcs  09/2009    Rourk-H.Pylori gastritis (s/p tx), benign sb bx, small hh, hemorrhoids, normal TI  . Hemorrhoid banding    . Colonoscopy N/A 05/21/2015    [id number] canal hemorrhoids otherwise normal   Family History  Problem Relation Age of Onset  . Breast cancer Maternal Grandmother    Social History  Substance Use Topics  . Smoking  status: Current Every Day Smoker -- 0.50 packs/day for 18 years    Types: Cigarettes  . Smokeless tobacco: None     Comment: 1/2 to one pack a day  . Alcohol Use: No   OB History    Gravida Para Term Preterm AB TAB SAB Ectopic Multiple Living   1 1  1            Review of Systems  Musculoskeletal: Positive for arthralgias.       Right ankle and leg pain  all other systems negative    Allergies  Bee venom; Acetaminophen; and Codeine  Home Medications   Prior to Admission medications   Medication Sig Start Date End Date Taking? Authorizing Provider  butalbital-acetaminophen-caffeine (FIORICET) 50-325-40 MG per tablet Take 1 tablet by mouth 2 (two) times daily as needed. Pain, migraines    Historical Provider, MD  CANASA 1000 MG suppository UNWRAP AND INSERT ONE SUPPOSITORY RECTALLY EVERY DAY AT BEDTIME 05/26/15   Historical Provider, MD  naproxen (NAPROSYN) 250 MG tablet Take 250 mg by mouth 2 (two) times daily with a meal.      Historical Provider, MD  omeprazole (PRILOSEC) 20 MG capsule TAKE ONE CAPSULE BY MOUTH EVERY DAY 01/20/15   Carlis Stable, NP  predniSONE (DELTASONE) 10 MG tablet Take 2 tablets (20 mg total) by mouth 2 (two) times  daily with a meal. 09/09/15   Areil Ottey Bunnie Pion, NP   BP 118/74 mmHg  Pulse 74  Temp(Src) 98.5 F (36.9 C) (Tympanic)  Resp 18  Ht 5\' 4"  (1.626 m)  Wt 81.647 kg  BMI 30.88 kg/m2  SpO2 100% Physical Exam  Constitutional: She is oriented to person, place, and time. She appears well-developed and well-nourished. No distress.  Eyes: Conjunctivae and EOM are normal.  Neck: Normal range of motion. Neck supple.  Cardiovascular: Normal rate.   Pulmonary/Chest: Effort normal.  Musculoskeletal: Normal range of motion.       Right ankle: She exhibits swelling. She exhibits normal range of motion, no deformity, no laceration and normal pulse. Tenderness. Lateral malleolus tenderness found. Achilles tendon normal.  Pedal pulses 2+, adequate circulation, good  touch sensation. Equal strength.   Neurological: She is alert and oriented to person, place, and time. No cranial nerve deficit.  Skin: Skin is warm and dry.  Psychiatric: She has a normal mood and affect. Her behavior is normal.  Nursing note and vitals reviewed.   ED Course  Procedures (including critical care time) Toradol 30 mg IM ASO applied to right ankle  Labs Review Labs Reviewed - No data to display  Imaging Review Dg Ankle Complete Right  09/09/2015  CLINICAL DATA:  Patient with pain and swelling in the right ankle for 3 days. No known injury. Initial encounter. EXAM: RIGHT ANKLE - COMPLETE 3+ VIEW COMPARISON:  None. FINDINGS: Normal anatomic alignment. No evidence for acute fracture or dislocation. Soft tissue swelling. Plantar calcaneal spurring. IMPRESSION: No acute osseous abnormality. Electronically Signed   By: Lovey Newcomer M.D.   On: 09/09/2015 19:03   I have personally reviewed and evaluated these images and lab results as part of my medical decision-making.   MDM  45 y.o. female with hx of chronic right leg pain and tonight right ankle pain and swelling stable for d/c without focal neuro deficits. Pain improved with ASO. She will follow up with Dr. Legrand Rams tomorrow. She will return here as needed. Rest, elevate, ice. Patient offered crutches but she declined.   Final diagnoses:  Acute right ankle pain      Ashley Murrain, NP 09/09/15 1953  Milton Ferguson, MD 09/09/15 [phone]

## 2015-10-20 ENCOUNTER — Other Ambulatory Visit: Payer: Medicaid Other | Admitting: Obstetrics & Gynecology

## 2015-10-21 ENCOUNTER — Other Ambulatory Visit: Payer: Medicaid Other | Admitting: Obstetrics & Gynecology

## 2015-10-29 ENCOUNTER — Other Ambulatory Visit: Payer: Medicaid Other | Admitting: Obstetrics & Gynecology

## 2015-11-03 ENCOUNTER — Encounter: Payer: Self-pay | Admitting: Obstetrics & Gynecology

## 2015-11-03 ENCOUNTER — Other Ambulatory Visit (HOSPITAL_COMMUNITY)
Admission: RE | Admit: 2015-11-03 | Discharge: 2015-11-03 | Disposition: A | Discharge status: Home / Self Care | Payer: Medicaid Other | Source: Ambulatory Visit | Attending: Obstetrics & Gynecology | Admitting: Obstetrics & Gynecology

## 2015-11-03 ENCOUNTER — Ambulatory Visit (INDEPENDENT_AMBULATORY_CARE_PROVIDER_SITE_OTHER): Payer: Medicaid Other | Admitting: Obstetrics & Gynecology

## 2015-11-03 VITALS — BP 120/80 | HR 76 | Ht 62.0 in | Wt 179.0 lb

## 2015-11-03 DIAGNOSIS — Z1151 Encounter for screening for human papillomavirus (HPV): Secondary | ICD-10-CM | POA: Diagnosis present

## 2015-11-03 DIAGNOSIS — Z01419 Encounter for gynecological examination (general) (routine) without abnormal findings: Secondary | ICD-10-CM

## 2015-11-03 DIAGNOSIS — Z0000 Encounter for general adult medical examination without abnormal findings: Secondary | ICD-10-CM

## 2015-11-03 NOTE — Progress Notes (Signed)
Patient ID: Alyssa Aguilar, female   DOB: [birth year], 45 y.o.   MRN: [id number] Subjective:     Alyssa Aguilar is a 45 y.o. female here for a routine exam.  No LMP recorded. Patient has had an ablation. G1P0100 Birth Control Method:  BTL Menstrual Calendar(currently): ablation  Current complaints: none.   Current acute medical issues:  none   Recent Gynecologic History No LMP recorded. Patient has had an ablation. Last Pap: 2016,  normal Last mammogram: 2017,  normal  Past Medical History  Diagnosis Date  . Migraines   . GERD (gastroesophageal reflux disease)   . Hemorrhoids   . Spina bifida occulta   . S/P colonoscopy March 2011    internal hemorrhoids  . S/P endoscopy March 2011    antral erosions, chronic active gastritis, +H.pylori  . Helicobacter pylori gastritis 2011    s/p treatment with Prevpac  . IBS (irritable bowel syndrome)     Past Surgical History  Procedure Laterality Date  . Cholecystectomy    . Tubal ligation    . Dilation and curettage of uterus    . Endometrial ablation    . Egd/tcs  09/2009    Rourk-H.Pylori gastritis (s/p tx), benign sb bx, small hh, hemorrhoids, normal TI  . Hemorrhoid banding    . Colonoscopy N/A 05/21/2015    [id number] canal hemorrhoids otherwise normal    OB History    Gravida Para Term Preterm AB TAB SAB Ectopic Multiple Living   1 1  1             Social History   Social History  . Marital Status: Single    Spouse Name: N/A  . Number of Children: 1  . Years of Education: N/A   Occupational History  . disabled     "spinal issues"   Social History Main Topics  . Smoking status: Current Every Day Smoker -- 0.50 packs/day for 18 years    Types: Cigarettes  . Smokeless tobacco: None     Comment: 1/2 to one pack a day  . Alcohol Use: No  . Drug Use: No  . Sexual Activity: Yes    Birth Control/ Protection: Surgical   Other Topics Concern  . None   Social History Narrative   Lives w/ grandmother & daughter     Family History  Problem Relation Age of Onset  . Breast cancer Maternal Grandmother      Current outpatient prescriptions:  .  butalbital-acetaminophen-caffeine (FIORICET) 50-325-40 MG per tablet, Take 1 tablet by mouth 2 (two) times daily as needed. Pain, migraines, Disp: , Rfl:  .  naproxen (NAPROSYN) 250 MG tablet, Take 250 mg by mouth 2 (two) times daily with a meal.  , Disp: , Rfl:  .  omeprazole (PRILOSEC) 20 MG capsule, TAKE ONE CAPSULE BY MOUTH EVERY DAY, Disp: 90 capsule, Rfl: 3 .  CANASA 1000 MG suppository, Reported on 11/03/2015, Disp: , Rfl: 1  Review of Systems  Review of Systems  Constitutional: Negative for fever, chills, weight loss, malaise/fatigue and diaphoresis.  HENT: Negative for hearing loss, ear pain, nosebleeds, congestion, sore throat, neck pain, tinnitus and ear discharge.   Eyes: Negative for blurred vision, double vision, photophobia, pain, discharge and redness.  Respiratory: Negative for cough, hemoptysis, sputum production, shortness of breath, wheezing and stridor.   Cardiovascular: Negative for chest pain, palpitations, orthopnea, claudication, leg swelling and PND.  Gastrointestinal: negative for abdominal pain. Negative for heartburn, nausea, vomiting, diarrhea, constipation, blood in stool and melena.  Genitourinary: Negative for dysuria, urgency, frequency, hematuria and flank pain.  Musculoskeletal: Negative for myalgias, back pain, joint pain and falls.  Skin: Negative for itching and rash.  Neurological: Negative for dizziness, tingling, tremors, sensory change, speech change, focal weakness, seizures, loss of consciousness, weakness and headaches.  Endo/Heme/Allergies: Negative for environmental allergies and polydipsia. Does not bruise/bleed easily.  Psychiatric/Behavioral: Negative for depression, suicidal ideas, hallucinations, memory loss and substance abuse. The patient is not nervous/anxious and does not have insomnia.         Objective:  Blood pressure 120/80, pulse 76, height 5\' 2"  (1.575 m), weight 179 lb (81.194 kg).   Physical Exam  Vitals reviewed. Constitutional: She is oriented to person, place, and time. She appears well-developed and well-nourished.  HENT:  Head: Normocephalic and atraumatic.        Right Ear: External ear normal.  Left Ear: External ear normal.  Nose: Nose normal.  Mouth/Throat: Oropharynx is clear and moist.  Eyes: Conjunctivae and EOM are normal. Pupils are equal, round, and reactive to light. Right eye exhibits no discharge. Left eye exhibits no discharge. No scleral icterus.  Neck: Normal range of motion. Neck supple. No tracheal deviation present. No thyromegaly present.  Cardiovascular: Normal rate, regular rhythm, normal heart sounds and intact distal pulses.  Exam reveals no gallop and no friction rub.   No murmur heard. Respiratory: Effort normal and breath sounds normal. No respiratory distress. She has no wheezes. She has no rales. She exhibits no tenderness.  GI: Soft. Bowel sounds are normal. She exhibits no distension and no mass. There is no tenderness. There is no rebound and no guarding.  Genitourinary:  Breasts no masses skin changes or nipple changes bilaterally      Vulva is normal without lesions Vagina is pink moist without discharge Cervix normal in appearance and pap is done Uterus is normal size shape and contour Adnexa is negative with normal sized ovaries   Musculoskeletal: Normal range of motion. She exhibits no edema and no tenderness.  Neurological: She is alert and oriented to person, place, and time. She has normal reflexes. She displays normal reflexes. No cranial nerve deficit. She exhibits normal muscle tone. Coordination normal.  Skin: Skin is warm and dry. No rash noted. No erythema. No pallor.  Psychiatric: She has a normal mood and affect. Her behavior is normal. Judgment and thought content normal.       Assessment:    Healthy female  exam.    Plan:    Follow up in: 1 year.    07/2015 mammogram  No orders of the defined types were placed in this encounter.    No orders of the defined types were placed in this encounter.

## 2015-11-03 NOTE — Addendum Note (Signed)
Addended by: Diona Fanti A on: 11/03/2015 12:14 PM   Modules accepted: Orders

## 2015-11-05 LAB — CYTOLOGY - PAP

## 2015-11-19 ENCOUNTER — Telehealth: Payer: Self-pay | Admitting: *Deleted

## 2015-11-19 ENCOUNTER — Telehealth: Payer: Self-pay | Admitting: Obstetrics & Gynecology

## 2015-11-19 MED ORDER — METRONIDAZOLE 500 MG PO TABS: 500.0000 mg | ORAL_TABLET | Freq: Two times a day (BID) | ORAL | Status: DC

## 2015-11-20 NOTE — Telephone Encounter (Signed)
Pt informed per Dr. Elonda Husky, Pap from 11/03/2015 showed Trich, Flagyl prescribed for pt and partner, no sex, appt scheduled for POC 12/05/2015. Pt verbalized understanding.

## 2015-12-05 ENCOUNTER — Encounter: Payer: Self-pay | Admitting: Obstetrics & Gynecology

## 2015-12-05 ENCOUNTER — Ambulatory Visit (INDEPENDENT_AMBULATORY_CARE_PROVIDER_SITE_OTHER): Payer: Medicaid Other | Admitting: Obstetrics & Gynecology

## 2015-12-05 VITALS — BP 120/80 | HR 72 | Wt 176.4 lb

## 2015-12-05 DIAGNOSIS — Z202 Contact with and (suspected) exposure to infections with a predominantly sexual mode of transmission: Secondary | ICD-10-CM | POA: Diagnosis not present

## 2015-12-05 NOTE — Progress Notes (Signed)
Patient ID: Alyssa Aguilar, female   DOB: [birth year], 45 y.o.   MRN: [id number] Chief Complaint  Patient presents with  . Follow-up    proof of cure    Blood pressure 120/80, pulse 72, weight 176 lb 6.4 oz (80.015 kg).  Pt had trichomonas on a Pap, she has been treated multiple times and has been counselled  TOC wet prep  Wet Prep:   A sample of vaginal discharge was obtained from the posterior fornix using a cotton swab. 2 drops of saline were placed on a slide and the cotton swab was immersed in the saline. Microscopic evaluation was performed and results were as follows:  Negative  for yeast  Negative for clue cells , consistent with Bacterial vaginosis Negative for trichomonas  Normal WBC population   Whiff test: Negative  Treated trichomonas  Follow up prn

## 2016-01-05 ENCOUNTER — Encounter: Payer: Self-pay | Admitting: Nurse Practitioner

## 2016-01-05 ENCOUNTER — Ambulatory Visit: Payer: Medicaid Other | Admitting: Nurse Practitioner

## 2016-01-05 ENCOUNTER — Telehealth: Payer: Self-pay | Admitting: Nurse Practitioner

## 2016-01-05 NOTE — Telephone Encounter (Signed)
PATIENT WAS A NO SHOW AND LETTER SENT  °

## 2016-01-06 NOTE — Telephone Encounter (Signed)
Noted  

## 2016-01-12 ENCOUNTER — Telehealth: Payer: Self-pay | Admitting: Nurse Practitioner

## 2016-01-12 ENCOUNTER — Ambulatory Visit: Payer: Medicaid Other | Admitting: Nurse Practitioner

## 2016-01-12 ENCOUNTER — Encounter: Payer: Self-pay | Admitting: Nurse Practitioner

## 2016-01-12 NOTE — Telephone Encounter (Signed)
Noted  

## 2016-01-12 NOTE — Telephone Encounter (Signed)
PATIENT WAS A NO SHOW AND LETTER SENT  °

## 2016-01-13 ENCOUNTER — Encounter (HOSPITAL_COMMUNITY): Payer: Self-pay | Admitting: *Deleted

## 2016-01-13 ENCOUNTER — Emergency Department (HOSPITAL_COMMUNITY)
Admission: EM | Admit: 2016-01-13 | Discharge: 2016-01-13 | Disposition: A | Discharge status: Home / Self Care | Payer: Medicaid Other | Attending: Emergency Medicine | Admitting: Emergency Medicine

## 2016-01-13 DIAGNOSIS — R51 Headache: Secondary | ICD-10-CM | POA: Insufficient documentation

## 2016-01-13 DIAGNOSIS — F17210 Nicotine dependence, cigarettes, uncomplicated: Secondary | ICD-10-CM | POA: Insufficient documentation

## 2016-01-13 DIAGNOSIS — R519 Headache, unspecified: Secondary | ICD-10-CM

## 2016-01-13 MED ORDER — DIPHENHYDRAMINE HCL 50 MG/ML IJ SOLN
25.0000 mg | Freq: Once | INTRAMUSCULAR | Status: AC
  Administered 2016-01-13: 25 mg via INTRAVENOUS
  Filled 2016-01-13: qty 1

## 2016-01-13 MED ORDER — SODIUM CHLORIDE 0.9 % IV SOLN
1000.0000 mL | INTRAVENOUS | Status: DC
  Administered 2016-01-13: 1000 mL via INTRAVENOUS

## 2016-01-13 MED ORDER — SODIUM CHLORIDE 0.9 % IV SOLN
1000.0000 mL | Freq: Once | INTRAVENOUS | Status: AC
  Administered 2016-01-13: 1000 mL via INTRAVENOUS

## 2016-01-13 MED ORDER — DEXAMETHASONE SODIUM PHOSPHATE 10 MG/ML IJ SOLN
10.0000 mg | Freq: Once | INTRAMUSCULAR | Status: AC
  Administered 2016-01-13: 10 mg via INTRAVENOUS
  Filled 2016-01-13: qty 1

## 2016-01-13 MED ORDER — METOCLOPRAMIDE HCL 10 MG PO TABS: 10.0000 mg | ORAL_TABLET | Freq: Four times a day (QID) | ORAL | Status: DC | PRN

## 2016-01-13 MED ORDER — METOCLOPRAMIDE HCL 5 MG/ML IJ SOLN
10.0000 mg | Freq: Once | INTRAMUSCULAR | Status: AC
  Administered 2016-01-13: 10 mg via INTRAVENOUS
  Filled 2016-01-13: qty 2

## 2016-01-13 NOTE — ED Notes (Signed)
Pt states she has had a headache that started yesterday. No relief from OTC meds. Pt says her PCP gives her medication for headaches but the medicine is at her daughters.

## 2016-01-13 NOTE — Discharge Instructions (Signed)
General Headache Without Cause °A headache is pain or discomfort felt around the head or neck area. The specific cause of a headache may not be found. There are many causes and types of headaches. A few common ones are: °· Tension headaches. °· Migraine headaches. °· Cluster headaches. °· Chronic daily headaches. °HOME CARE INSTRUCTIONS  °Watch your condition for any changes. Take these steps to help with your condition: °Managing Pain °· Take over-the-counter and prescription medicines only as told by your health care provider. °· Lie down in a dark, quiet room when you have a headache. °· If directed, apply ice to the head and neck area: °¨ Put ice in a plastic bag. °¨ Place a towel between your skin and the bag. °¨ Leave the ice on for 20 minutes, 2-3 times per day. °· Use a heating pad or hot shower to apply heat to the head and neck area as told by your health care provider. °· Keep lights dim if bright lights bother you or make your headaches worse. °Eating and Drinking °· Eat meals on a regular schedule. °· Limit alcohol use. °· Decrease the amount of caffeine you drink, or stop drinking caffeine. °General Instructions °· Keep all follow-up visits as told by your health care provider. This is important. °· Keep a headache journal to help find out what may trigger your headaches. For example, write down: °¨ What you eat and drink. °¨ How much sleep you get. °¨ Any change to your diet or medicines. °· Try massage or other relaxation techniques. °· Limit stress. °· Sit up straight, and do not tense your muscles. °· Do not use tobacco products, including cigarettes, chewing tobacco, or e-cigarettes. If you need help quitting, ask your health care provider. °· Exercise regularly as told by your health care provider. °· Sleep on a regular schedule. Get 7-9 hours of sleep, or the amount recommended by your health care provider. °SEEK MEDICAL CARE IF:  °· Your symptoms are not helped by medicine. °· You have a  headache that is different from the usual headache. °· You have nausea or you vomit. °· You have a fever. °SEEK IMMEDIATE MEDICAL CARE IF:  °· Your headache becomes severe. °· You have repeated vomiting. °· You have a stiff neck. °· You have a loss of vision. °· You have problems with speech. °· You have pain in the eye or ear. °· You have muscular weakness or loss of muscle control. °· You lose your balance or have trouble walking. °· You feel faint or pass out. °· You have confusion. °  °This information is not intended to replace advice given to you by your health care provider. Make sure you discuss any questions you have with your health care provider. °  °Document Released: 07/19/2005 Document Revised: 04/09/2015 Document Reviewed: 11/11/2014 °Elsevier Interactive Patient Education ©2016 [employer] ° °Metoclopramide tablets °What is this medicine? °METOCLOPRAMIDE (met oh kloe PRA mide) is used to treat the symptoms of gastroesophageal reflux disease (GERD) like heartburn. It is also used to treat people with slow emptying of the stomach and intestinal tract. °This medicine may be used for other purposes; ask your health care provider or pharmacist if you have questions. °What should I tell my health care provider before I take this medicine? °They need to know if you have any of these conditions: °-breast cancer °-depression °-diabetes °-heart failure °-high blood pressure °-kidney disease °-liver disease °-Parkinson's disease or a movement disorder °-pheochromocytoma °-seizures °-stomach obstruction,   bleeding, or perforation °-an unusual or allergic reaction to metoclopramide, procainamide, sulfites, other medicines, foods, dyes, or preservatives °-pregnant or trying to get pregnant °-breast-feeding °How should I use this medicine? °Take this medicine by mouth with a glass of water. Follow the directions on the prescription label. Take this medicine on an empty stomach, about 30 minutes before eating. Take  your doses at regular intervals. Do not take your medicine more often than directed. Do not stop taking except on the advice of your doctor or health care professional. °A special MedGuide will be given to you by the pharmacist with each prescription and refill. Be sure to read this information carefully each time. °Talk to your pediatrician regarding the use of this medicine in children. Special care may be needed. °Overdosage: If you think you have taken too much of this medicine contact a poison control center or emergency room at once. °NOTE: This medicine is only for you. Do not share this medicine with others. °What if I miss a dose? °If you miss a dose, take it as soon as you can. If it is almost time for your next dose, take only that dose. Do not take double or extra doses. °What may interact with this medicine? °-acetaminophen °-cyclosporine °-digoxin °-medicines for blood pressure °-medicines for diabetes, including insulin °-medicines for hay fever and other allergies °-medicines for depression, especially an Monoamine Oxidase Inhibitor (MAOI) °-medicines for Parkinson's disease, like levodopa °-medicines for sleep or for pain °-tetracycline °This list may not describe all possible interactions. Give your health care provider a list of all the medicines, herbs, non-prescription drugs, or dietary supplements you use. Also tell them if you smoke, drink alcohol, or use illegal drugs. Some items may interact with your medicine. °What should I watch for while using this medicine? °It may take a few weeks for your stomach condition to start to get better. However, do not take this medicine for longer than 12 weeks. The longer you take this medicine, and the more you take it, the greater your chances are of developing serious side effects. If you are an elderly patient, a female patient, or you have diabetes, you may be at an increased risk for side effects from this medicine. Contact your doctor immediately if  you start having movements you cannot control such as lip smacking, rapid movements of the tongue, involuntary or uncontrollable movements of the eyes, head, arms and legs, or muscle twitches and spasms. °Patients and their families should watch out for worsening depression or thoughts of suicide. Also watch out for any sudden or severe changes in feelings such as feeling anxious, agitated, panicky, irritable, hostile, aggressive, impulsive, severely restless, overly excited and hyperactive, or not being able to sleep. If this happens, especially at the beginning of treatment or after a change in dose, call your doctor. °Do not treat yourself for high fever. Ask your doctor or health care professional for advice. °You may get drowsy or dizzy. Do not drive, use machinery, or do anything that needs mental alertness until you know how this drug affects you. Do not stand or sit up quickly, especially if you are an older patient. This reduces the risk of dizzy or fainting spells. Alcohol can make you more drowsy and dizzy. Avoid alcoholic drinks. °What side effects may I notice from receiving this medicine? °Side effects that you should report to your doctor or health care professional as soon as possible: °-allergic reactions like skin rash, itching or hives, swelling of the   face, lips, or tongue -abnormal production of milk in females -breast enlargement in both males and females -change in the way you walk -difficulty moving, speaking or swallowing -drooling, lip smacking, or rapid movements of the tongue -excessive sweating -fever -involuntary or uncontrollable movements of the eyes, head, arms and legs -irregular heartbeat or palpitations -muscle twitches and spasms -unusually weak or tired Side effects that usually do not require medical attention (report to your doctor or health care professional if they continue or are bothersome): -change in sex drive or performance -depressed  mood -diarrhea -difficulty sleeping -headache -menstrual changes -restless or nervous This list may not describe all possible side effects. Call your doctor for medical advice about side effects. You may report side effects to FDA at 1-800-FDA-1088. Where should I keep my medicine? Keep out of the reach of children. Store at room temperature between 20 and 25 degrees C (68 and 77 degrees F). Protect from light. Keep container tightly closed. Throw away any unused medicine after the expiration date. NOTE: This sheet is a summary. It may not cover all possible information. If you have questions about this medicine, talk to your doctor, pharmacist, or health care provider.    2016, Elsevier/Gold Standard. (2011-11-16 13:04:38)

## 2016-01-13 NOTE — ED Notes (Signed)
Pt alert & oriented x4, stable gait. Patient given discharge instructions, paperwork & prescription(s). Patient  instructed to stop at the registration desk to finish any additional paperwork. Patient verbalized understanding. Pt left department w/ no further questions. 

## 2016-01-13 NOTE — ED Notes (Signed)
Pt c/o headache x 1 day; pt states she has taken [facility] powder and tylenol with no relief

## 2016-01-13 NOTE — ED Provider Notes (Signed)
CSN: [id number]     Arrival date & time 01/13/16  0101 History   First MD Initiated Contact with Patient 01/13/16 0214     Chief Complaint  Patient presents with  . Headache     (Consider location/radiation/quality/duration/timing/severity/associated sxs/prior Treatment) Patient is a 45 y.o. female presenting with headaches. The history is provided by the patient.  Headache Has a history of migraines and comes in with a bifrontal headache. Patient is a somewhat inconsistent historian, but apparently there was a death in the family one week ago and there has been a lot of stress at home since then. She is inconsistent about the onset of headaches daily at times it is started this morning, at times it was there yesterday, at times the day before and possibly even prior to that. Headache is sharp and she rates it at 9/10. There is associated nausea but no vomiting. She denies visual disturbance. Headache is worse with loud noise but not with light exposure. She took a BC powder and acetaminophen which did give slight relief. She came in tonight because she was having difficult sleeping because of the headache.  Past Medical History  Diagnosis Date  . Migraines   . GERD (gastroesophageal reflux disease)   . Hemorrhoids   . Spina bifida occulta   . S/P colonoscopy March 2011    internal hemorrhoids  . S/P endoscopy March 2011    antral erosions, chronic active gastritis, +H.pylori  . Helicobacter pylori gastritis 2011    s/p treatment with Prevpac  . IBS (irritable bowel syndrome)    Past Surgical History  Procedure Laterality Date  . Cholecystectomy    . Tubal ligation    . Dilation and curettage of uterus    . Endometrial ablation    . Egd/tcs  09/2009    Rourk-H.Pylori gastritis (s/p tx), benign sb bx, small hh, hemorrhoids, normal TI  . Hemorrhoid banding    . Colonoscopy N/A 05/21/2015    [id number] canal hemorrhoids otherwise normal   Family History  Problem Relation Age of  Onset  . Breast cancer Maternal Grandmother    Social History  Substance Use Topics  . Smoking status: Current Every Day Smoker -- 0.50 packs/day for 18 years    Types: Cigarettes  . Smokeless tobacco: None     Comment: 1/2 to one pack a day  . Alcohol Use: No   OB History    Gravida Para Term Preterm AB TAB SAB Ectopic Multiple Living   1 1  1            Review of Systems  Neurological: Positive for headaches.  All other systems reviewed and are negative.     Allergies  Bee venom; Acetaminophen; and Codeine  Home Medications   Prior to Admission medications   Medication Sig Start Date End Date Taking? Authorizing Provider  butalbital-acetaminophen-caffeine (FIORICET) 50-325-40 MG per tablet Take 1 tablet by mouth 2 (two) times daily as needed. Pain, migraines    Historical Provider, MD  CANASA 1000 MG suppository Reported on 11/03/2015 05/26/15   Historical Provider, MD  naproxen (NAPROSYN) 250 MG tablet Take 250 mg by mouth 2 (two) times daily with a meal.      Historical Provider, MD  omeprazole (PRILOSEC) 20 MG capsule TAKE ONE CAPSULE BY MOUTH EVERY DAY 01/20/15   Carlis Stable, NP   BP 105/75 mmHg  Pulse 60  Temp(Src) 97.7 F (36.5 C) (Oral)  Resp 18  Ht 5\' 4"  (1.626 m)  Wt 172 lb (78.019 kg)  BMI 29.51 kg/m2  SpO2 100% Physical Exam  Nursing note and vitals reviewed.  45 year old female, resting comfortably and in no acute distress. Vital signs are normal. Oxygen saturation is 100%, which is normal. Head is normocephalic and atraumatic. PERRLA, EOMI. Oropharynx is clear. Fundi show no hemorrhage, exudate, or papilledema. There is mild tenderness to palpation over the temporalis muscles. There is no tenderness palpation over the paracervical muscles Neck is nontender and supple without adenopathy or JVD. Back is nontender and there is no CVA tenderness. Lungs are clear without rales, wheezes, or rhonchi. Chest is nontender. Heart has regular rate and rhythm  without murmur. Abdomen is soft, flat, nontender without masses or hepatosplenomegaly and peristalsis is normoactive. Extremities have no cyanosis or edema, full range of motion is present. Skin is warm and dry without rash. Neurologic: Mental status is normal, cranial nerves are intact, there are no motor or sensory deficits.  ED Course  Procedures (including critical care time)   MDM   Final diagnoses:  Headache, unspecified headache type    Headache which appears to be a muscle contraction headache. It is possible this is also 1 however migraines, but it is somewhat atypical for migraines. No red flags to suspect more serious headache such as meningitis, subarachnoid hemorrhage, intracranial hypertension. Old records are reviewed and I see no prior ED visits for headaches. Agent looks well and is neurologically intact. She is given a headache cocktail of normal saline, metoclopramide, diphenhydramine, and dexamethasone. I do not see any indication for laboratory testing a CT scan tonight.  She had excellent relief of her headache with above noted treatment. She is discharged with prescription for metoclopramide. Follow-up with PCP when necessary.  Delora Fuel, MD [id number]

## 2016-02-13 ENCOUNTER — Other Ambulatory Visit: Payer: Self-pay | Admitting: Nurse Practitioner

## 2016-02-17 ENCOUNTER — Encounter: Payer: Self-pay | Admitting: Nurse Practitioner

## 2016-03-04 ENCOUNTER — Ambulatory Visit: Payer: Medicaid Other | Admitting: Nurse Practitioner

## 2016-03-22 ENCOUNTER — Telehealth: Payer: Self-pay | Admitting: Nurse Practitioner

## 2016-03-22 ENCOUNTER — Encounter: Payer: Self-pay | Admitting: Nurse Practitioner

## 2016-03-22 ENCOUNTER — Ambulatory Visit: Payer: Medicaid Other | Admitting: Nurse Practitioner

## 2016-03-22 NOTE — Telephone Encounter (Signed)
PT WAS A NO SHOW AND LETTER SENT  °

## 2016-03-22 NOTE — Telephone Encounter (Signed)
Noted  

## 2016-03-23 ENCOUNTER — Encounter: Payer: Self-pay | Admitting: Nurse Practitioner

## 2016-03-23 ENCOUNTER — Ambulatory Visit (INDEPENDENT_AMBULATORY_CARE_PROVIDER_SITE_OTHER): Payer: Medicaid Other | Admitting: Nurse Practitioner

## 2016-03-23 VITALS — BP 105/69 | HR 71 | Temp 97.9°F | Ht 65.0 in | Wt 163.2 lb

## 2016-03-23 DIAGNOSIS — K625 Hemorrhage of anus and rectum: Secondary | ICD-10-CM

## 2016-03-23 DIAGNOSIS — K649 Unspecified hemorrhoids: Secondary | ICD-10-CM

## 2016-03-23 MED ORDER — HYDROCORTISONE 2.5 % RE CREA: 1.0000 "application " | TOPICAL_CREAM | Freq: Two times a day (BID) | RECTAL | 1 refills | Status: DC

## 2016-03-23 NOTE — Progress Notes (Signed)
cc'ed to pcp °

## 2016-03-23 NOTE — Progress Notes (Signed)
Referring Provider: Rosita Fire, MD Primary Care Physician:  Rosita Fire, MD Primary GI:  Dr. Gala Romney  Chief Complaint  Patient presents with  . Rectal Bleeding  . Hemorrhoids    HPI:   Alyssa Aguilar is a 45 y.o. female who presents for follow-up on rectal bleeding. She was last seen in our office 07/07/2015 at which point her reading was improved for suppositories, occasional post bowel movement bleeding, some fecal urgency with certain foods, minimal abdominal pain. No other symptoms. Colonoscopy was recently completed 05/21/2015 which found anal canal hemorrhoids as likely source of hematochezia, otherwise normal exam. Was given 1 month of course of Canasa suppositories at bedtime. She was recommended to finish her Canasa suppositories, call with any problems and can consider Anusol cream or suppositories as needed. Follow-up in 6 months.  She was a no-show to her 3 scheduled follow-up appointments on 01/05/2016, 01/12/2016, 03/22/2016.  Today she states her hemorrhoids started flaring up 3 days ago with hemorrhoid pain/irritation, some toilet tissue hematochezia. Is out of hemorrhoid cream. This morning seems to have resolved. Occasional RLQ abdominal pain, intermittent. Has a bowel movement daily. Stools pass easily but occasionally has to strain. Denies chest pain, worsening dyspnea, dizziness, lightheadedness, syncope, near syncope. Denies any other upper or lower GI symptoms.  Past Medical History:  Diagnosis Date  . GERD (gastroesophageal reflux disease)   . Helicobacter pylori gastritis 2011   s/p treatment with Prevpac  . Hemorrhoids   . IBS (irritable bowel syndrome)   . Migraines   . S/P colonoscopy March 2011   internal hemorrhoids  . S/P endoscopy March 2011   antral erosions, chronic active gastritis, +H.pylori  . Spina bifida occulta     Past Surgical History:  Procedure Laterality Date  . CHOLECYSTECTOMY    . COLONOSCOPY N/A 05/21/2015   [id number] canal  hemorrhoids otherwise normal  . DILATION AND CURETTAGE OF UTERUS    . egd/tcs  09/2009   Rourk-H.Pylori gastritis (s/p tx), benign sb bx, small hh, hemorrhoids, normal TI  . ENDOMETRIAL ABLATION    . HEMORRHOID BANDING    . TUBAL LIGATION      Current Outpatient Prescriptions  Medication Sig Dispense Refill  . butalbital-acetaminophen-caffeine (FIORICET) 50-325-40 MG per tablet Take 1 tablet by mouth 2 (two) times daily as needed. Pain, migraines    . metoCLOPramide (REGLAN) 10 MG tablet Take 1 tablet (10 mg total) by mouth every 6 (six) hours as needed for nausea (or headache). 30 tablet 0  . naproxen (NAPROSYN) 250 MG tablet Take 250 mg by mouth 2 (two) times daily with a meal.      . omeprazole (PRILOSEC) 20 MG capsule TAKE 1 CAPSULE BY MOUTH EVERY DAY 90 capsule 3  . CANASA 1000 MG suppository Reported on 11/03/2015  1   No current facility-administered medications for this visit.     Allergies as of 03/23/2016 - Review Complete 03/23/2016  Allergen Reaction Noted  . Bee venom Anaphylaxis 11/06/2011  . Acetaminophen Swelling and Other (See Comments)   . Codeine Other (See Comments)     Family History  Problem Relation Age of Onset  . Breast cancer Maternal Grandmother     Social History   Social History  . Marital status: Single    Spouse name: N/A  . Number of children: 1  . Years of education: N/A   Occupational History  . disabled     "spinal issues"   Social History Main Topics  . Smoking status: Current  Every Day Smoker    Packs/day: 0.50    Years: 18.00    Types: Cigarettes  . Smokeless tobacco: Not on file     Comment: 1/2 to one pack a day  . Alcohol use No  . Drug use: No  . Sexual activity: Yes    Birth control/ protection: Surgical   Other Topics Concern  . Not on file   Social History Narrative   Lives w/ grandmother & daughter    Review of Systems: General: Negative for anorexia, weight loss, fever, chills, fatigue, weakness. Eyes:  Negative for vision changes.  ENT: Negative for hoarseness, difficulty swallowing. CV: Negative for chest pain, angina, palpitations, peripheral edema.  Respiratory: Negative for dyspnea at rest, cough, sputum, wheezing.  GI: See history of present illness. Endo: Negative for unusual weight change.    Physical Exam: BP 105/69   Pulse 71   Temp 97.9 F (36.6 C) (Oral)   Ht 5\' 5"  (1.651 m)   Wt 163 lb 3.2 oz (74 kg)   BMI 27.16 kg/m  General:   Alert and oriented. Pleasant and cooperative. Well-nourished and well-developed.  Ears:  Normal auditory acuity. Cardiovascular:  S1, S2 present without murmurs appreciated. Extremities without clubbing or edema. Respiratory:  Clear to auscultation bilaterally. No wheezes, rales, or rhonchi. No distress.  Gastrointestinal:  +BS, soft, non-tender and non-distended. No HSM noted. No guarding or rebound. No masses appreciated.  Rectal:  Deferred  Musculoskalatal:  Symmetrical without gross deformities. Skin:  Intact without significant lesions or rashes. Neurologic:  Alert and oriented x4;  grossly normal neurologically. Psych:  Alert and cooperative. Normal mood and affect. Heme/Lymph/Immune: No excessive bruising noted.    03/23/2016 10:06 AM   Disclaimer: This note was dictated with voice recognition software. Similar sounding words can inadvertently be transcribed and may not be corrected upon review.

## 2016-03-23 NOTE — Assessment & Plan Note (Signed)
Toilet tissue hematochezia associated with her hemorrhoid flare up. Colonoscopy up-to-date completed 05/21/2015 which found anal canal hemorrhoids as likely source of hematochezia, otherwise normal exam. This is reassuring. Anusol cream as noted above, return for follow-up as needed. Surgical referral as an option in the future versus hemorrhoid banding if she continues to be symptomatically.

## 2016-03-23 NOTE — Patient Instructions (Signed)
1. I send a refill of your hemorrhoid cream to the pharmacy. Apply a pea-sized amount to your hemorrhoids twice a day. You can do this for up to 10 days, at which point he needs to take a break. 2. Let us know if your hemorrhoids continue to bother you and we can discuss other options to get rid of them. 3. Return for follow-up as needed.

## 2016-03-23 NOTE — Assessment & Plan Note (Signed)
Patient with hemorrhoid flare for the past 3 days seems to be resolving as of this morning. I will refill Anusol rectal cream for her. I have instructed her how to take it. Return for follow-up as needed. If her hemorrhoids continue to flare up and are persistently bothersome we can consider surgical evaluation for possible hemorrhoidectomy.

## 2016-09-27 ENCOUNTER — Other Ambulatory Visit (HOSPITAL_COMMUNITY): Payer: Self-pay | Admitting: Internal Medicine

## 2016-09-27 DIAGNOSIS — N6489 Other specified disorders of breast: Secondary | ICD-10-CM

## 2016-10-19 ENCOUNTER — Encounter (HOSPITAL_COMMUNITY): Payer: Self-pay

## 2016-10-19 ENCOUNTER — Encounter (HOSPITAL_COMMUNITY): Payer: Medicaid Other

## 2016-10-19 ENCOUNTER — Other Ambulatory Visit (HOSPITAL_COMMUNITY): Payer: Self-pay | Admitting: Internal Medicine

## 2016-10-19 ENCOUNTER — Ambulatory Visit (HOSPITAL_COMMUNITY)
Admission: RE | Admit: 2016-10-19 | Discharge: 2016-10-19 | Disposition: A | Discharge status: Home / Self Care | Payer: Medicaid Other | Source: Ambulatory Visit | Attending: Internal Medicine | Admitting: Internal Medicine

## 2016-10-19 DIAGNOSIS — N6489 Other specified disorders of breast: Secondary | ICD-10-CM

## 2016-10-26 ENCOUNTER — Ambulatory Visit: Payer: Medicaid Other | Admitting: Physician Assistant

## 2016-10-26 ENCOUNTER — Encounter (HOSPITAL_COMMUNITY): Payer: Medicaid Other

## 2016-10-26 ENCOUNTER — Encounter: Payer: Self-pay | Admitting: Physician Assistant

## 2016-10-26 VITALS — BP 100/66 | HR 73 | Temp 97.7°F | Ht 64.25 in | Wt 180.0 lb

## 2016-10-26 DIAGNOSIS — F17210 Nicotine dependence, cigarettes, uncomplicated: Secondary | ICD-10-CM

## 2016-10-26 DIAGNOSIS — E669 Obesity, unspecified: Secondary | ICD-10-CM

## 2016-10-26 DIAGNOSIS — Z6830 Body mass index (BMI) 30.0-30.9, adult: Secondary | ICD-10-CM

## 2016-10-26 DIAGNOSIS — K219 Gastro-esophageal reflux disease without esophagitis: Secondary | ICD-10-CM

## 2016-10-26 DIAGNOSIS — H6123 Impacted cerumen, bilateral: Secondary | ICD-10-CM

## 2016-10-26 DIAGNOSIS — Z131 Encounter for screening for diabetes mellitus: Secondary | ICD-10-CM

## 2016-10-26 LAB — GLUCOSE, POCT (MANUAL RESULT ENTRY): POC GLUCOSE: 86 mg/dL (ref 70–99)

## 2016-10-26 MED ORDER — OMEPRAZOLE 20 MG PO CPDR: DELAYED_RELEASE_CAPSULE | ORAL | 3 refills | Status: DC

## 2016-10-26 NOTE — Progress Notes (Signed)
BP 100/66 (BP Location: Left Arm, Patient Position: Sitting, Cuff Size: Normal)   Pulse 73   Temp 97.7 F (36.5 C)   Ht 5' 4.25" (1.632 m)   Wt 180 lb (81.6 kg)   SpO2 98%   BMI 30.66 kg/m    Subjective:    Patient ID: Alyssa Aguilar, female    DOB: 01/25/1971, 46 y.o.   MRN: [id number]  HPI: Alyssa Aguilar is a 46 y.o. female presenting on 10/26/2016 for New Patient (Initial Visit) (pt last seen Dr. Legrand Rams Feb. 2018. pt states she is here for dental. pt states she'll start coming here for medical care because he doesn't do anything for her.)   HPI   Pt says she wants to start coming here because this office is closer to her home than dr Josephine Cables office.  She was last seen by him in February.   Pt has seen GI for rectal bleeding  She Was told f/u prn  She has appt with Dr Elonda Husky for PAP next month  Pt smokes 2 1/2 ppd  Pt states no problems just wants to make sure she keeps on her reflux medication  Relevant past medical, surgical, family and social history reviewed and updated as indicated. Interim medical history since our last visit reviewed. Allergies and medications reviewed and updated.   Current Outpatient Prescriptions:  .  EPINEPHrine (EPIPEN IJ), Inject as directed as needed., Disp: , Rfl:  .  hydrocortisone (ANUSOL-HC) 2.5 % rectal cream, Place 1 application rectally 2 (two) times daily., Disp: 30 g, Rfl: 1 .  metoCLOPramide (REGLAN) 10 MG tablet, Take 1 tablet (10 mg total) by mouth every 6 (six) hours as needed for nausea (or headache)., Disp: 30 tablet, Rfl: 0 .  naproxen (NAPROSYN) 250 MG tablet, Take 250 mg by mouth 2 (two) times daily with a meal.  , Disp: , Rfl:  .  omeprazole (PRILOSEC) 20 MG capsule, TAKE 1 CAPSULE BY MOUTH EVERY DAY, Disp: 90 capsule, Rfl: 3   Review of Systems  Constitutional: Negative for appetite change, chills, diaphoresis, fatigue, fever and unexpected weight change.  HENT: Negative for congestion, dental problem, drooling, ear pain,  facial swelling, hearing loss, mouth sores, sneezing, sore throat, trouble swallowing and voice change.   Eyes: Negative for pain, discharge, redness, itching and visual disturbance.  Respiratory: Negative for cough, choking, shortness of breath and wheezing.   Cardiovascular: Negative for chest pain, palpitations and leg swelling.  Gastrointestinal: Negative for abdominal pain, blood in stool, constipation, diarrhea and vomiting.  Endocrine: Negative for cold intolerance, heat intolerance and polydipsia.  Genitourinary: Negative for decreased urine volume, dysuria and hematuria.  Musculoskeletal: Positive for back pain. Negative for arthralgias and gait problem.  Skin: Negative for rash.  Allergic/Immunologic: Positive for environmental allergies.  Neurological: Positive for headaches. Negative for seizures, syncope and light-headedness.  Hematological: Negative for adenopathy.  Psychiatric/Behavioral: Negative for agitation, dysphoric mood and suicidal ideas. The patient is not nervous/anxious.     Per HPI unless specifically indicated above     Objective:    BP 100/66 (BP Location: Left Arm, Patient Position: Sitting, Cuff Size: Normal)   Pulse 73   Temp 97.7 F (36.5 C)   Ht 5' 4.25" (1.632 m)   Wt 180 lb (81.6 kg)   SpO2 98%   BMI 30.66 kg/m   Wt Readings from Last 3 Encounters:  10/26/16 180 lb (81.6 kg)  03/23/16 163 lb 3.2 oz (74 kg)  01/13/16 172 lb (78 kg)  Physical Exam  Constitutional: She is oriented to person, place, and time. She appears well-developed and well-nourished.  HENT:  Head: Normocephalic and atraumatic.  Right Ear: A foreign body is present.  Left Ear: A foreign body is present.  Mouth/Throat: Oropharynx is clear and moist. No oropharyngeal exudate.  Cerumen B.  After lavage, TMs clear  Eyes: Conjunctivae and EOM are normal. Pupils are equal, round, and reactive to light.  Neck: Neck supple. No thyromegaly present.  Cardiovascular: Normal rate  and regular rhythm.   Pulmonary/Chest: Effort normal and breath sounds normal.  Abdominal: Soft. Bowel sounds are normal. She exhibits no mass. There is no hepatosplenomegaly. There is no tenderness.  Musculoskeletal: She exhibits no edema.  Lymphadenopathy:    She has no cervical adenopathy.  Neurological: She is alert and oriented to person, place, and time. Gait normal.  Speech very difficult to understand  Skin: Skin is warm and dry.  Psychiatric: She has a normal mood and affect. Her behavior is normal.  Vitals reviewed.   Results for orders placed or performed in visit on 10/26/16  POCT Glucose (CBG)  Result Value Ref Range   POC Glucose 86 70 - 99 mg/dl      Assessment & Plan:    Encounter Diagnoses  Name Primary?  . Gastroesophageal reflux disease, esophagitis presence not specified Yes  . Bilateral impacted cerumen   . Cigarette nicotine dependence without complication   . Class 1 obesity with body mass index (BMI) of 30.0 to 30.9 in adult, unspecified obesity type, unspecified whether serious comorbidity present   . Screening for diabetes mellitus (DM)      -Recent Labs requested and received from dr Legrand Rams- dated 06/29/2016-cbcd, lipids, bmp, hepatic function-  All good -pt counseled on smoking cessation or at least cutting back to no more than 1 ppd -refilled omeprazole -follow up 2 months.  RTO sooner prn

## 2016-10-26 NOTE — Patient Instructions (Signed)
Steps to Quit Smoking Smoking tobacco can be bad for your health. It can also affect almost every organ in your body. Smoking puts you and people around you at risk for many serious long-lasting (chronic) diseases. Quitting smoking is hard, but it is one of the best things that you can do for your health. It is never too late to quit. What are the benefits of quitting smoking? When you quit smoking, you lower your risk for getting serious diseases and conditions. They can include:  Lung cancer or lung disease.  Heart disease.  Stroke.  Heart attack.  Not being able to have children (infertility).  Weak bones (osteoporosis) and broken bones (fractures). If you have coughing, wheezing, and shortness of breath, those symptoms may get better when you quit. You may also get sick less often. If you are pregnant, quitting smoking can help to lower your chances of having a baby of low birth weight. What can I do to help me quit smoking? Talk with your doctor about what can help you quit smoking. Some things you can do (strategies) include:  Quitting smoking totally, instead of slowly cutting back how much you smoke over a period of time.  Going to in-person counseling. You are more likely to quit if you go to many counseling sessions.  Using resources and support systems, such as:  Online chats with a counselor.  Phone quitlines.  Printed self-help materials.  Support groups or group counseling.  Text messaging programs.  Mobile phone apps or applications.  Taking medicines. Some of these medicines may have nicotine in them. If you are pregnant or breastfeeding, do not take any medicines to quit smoking unless your doctor says it is okay. Talk with your doctor about counseling or other things that can help you. Talk with your doctor about using more than one strategy at the same time, such as taking medicines while you are also going to in-person counseling. This can help make quitting  easier. What things can I do to make it easier to quit? Quitting smoking might feel very hard at first, but there is a lot that you can do to make it easier. Take these steps:  Talk to your family and friends. Ask them to support and encourage you.  Call phone quitlines, reach out to support groups, or work with a counselor.  Ask people who smoke to not smoke around you.  Avoid places that make you want (trigger) to smoke, such as:  Bars.  Parties.  Smoke-break areas at work.  Spend time with people who do not smoke.  Lower the stress in your life. Stress can make you want to smoke. Try these things to help your stress:  Getting regular exercise.  Deep-breathing exercises.  Yoga.  Meditating.  Doing a body scan. To do this, close your eyes, focus on one area of your body at a time from head to toe, and notice which parts of your body are tense. Try to relax the muscles in those areas.  Download or buy apps on your mobile phone or tablet that can help you stick to your quit plan. There are many free apps, such as QuitGuide from the CDC (Centers for Disease Control and Prevention). You can find more support from smokefree.gov and other websites. This information is not intended to replace advice given to you by your health care provider. Make sure you discuss any questions you have with your health care provider. Document Released: 05/15/2009 Document Revised: 03/16/2016 Document   Reviewed: 12/03/2014 Elsevier Interactive Patient Education  2017 [employer]  

## 2016-11-01 ENCOUNTER — Encounter: Payer: Self-pay | Admitting: Physician Assistant

## 2016-11-02 ENCOUNTER — Encounter: Payer: Self-pay | Admitting: Physician Assistant

## 2016-11-02 NOTE — Progress Notes (Signed)
Pt came to the office this morning c/o swollen left eye, itching, and discharge since 10-27-16. Due to PA not in the office this week, pt was set up with an appointment with [facility] for today, 11-02-16 at 1:30 PM. Pt was told to arrive at 1 pm. Pt notified and verbalized understanding.

## 2016-11-18 ENCOUNTER — Other Ambulatory Visit: Payer: Medicaid Other | Admitting: Obstetrics & Gynecology

## 2016-11-26 ENCOUNTER — Encounter (INDEPENDENT_AMBULATORY_CARE_PROVIDER_SITE_OTHER): Payer: Self-pay

## 2016-11-26 ENCOUNTER — Encounter: Payer: Self-pay | Admitting: Obstetrics & Gynecology

## 2016-11-26 ENCOUNTER — Ambulatory Visit (INDEPENDENT_AMBULATORY_CARE_PROVIDER_SITE_OTHER): Payer: Medicaid Other | Admitting: Obstetrics & Gynecology

## 2016-11-26 ENCOUNTER — Other Ambulatory Visit (HOSPITAL_COMMUNITY)
Admission: RE | Admit: 2016-11-26 | Discharge: 2016-11-26 | Disposition: A | Discharge status: Home / Self Care | Payer: Medicaid Other | Source: Ambulatory Visit | Attending: Obstetrics & Gynecology | Admitting: Obstetrics & Gynecology

## 2016-11-26 VITALS — BP 100/60 | HR 80 | Wt 181.0 lb

## 2016-11-26 DIAGNOSIS — Z01419 Encounter for gynecological examination (general) (routine) without abnormal findings: Secondary | ICD-10-CM | POA: Diagnosis not present

## 2016-11-26 NOTE — Addendum Note (Signed)
Addended by: Diona Fanti A on: 11/26/2016 01:43 PM   Modules accepted: Orders

## 2016-11-26 NOTE — Progress Notes (Signed)
Subjective:     Alyssa Aguilar is a 46 y.o. female here for a routine exam.  No LMP recorded. Patient has had an ablation. G1P1001 Birth Control Method:  Tubal  ligation Menstrual Calendar(currently): amenorrheic s/p ablation  Current complaints: none.   Current acute medical issues:  none   Recent Gynecologic History No LMP recorded. Patient has had an ablation. Last Pap: 2017,  normal Last mammogram: 09/2016,  normal  Past Medical History:  Diagnosis Date  . GERD (gastroesophageal reflux disease)   . Helicobacter pylori gastritis 2011   s/p treatment with Prevpac  . Hemorrhoids   . IBS (irritable bowel syndrome)   . Migraines   . S/P colonoscopy March 2011   internal hemorrhoids  . S/P endoscopy March 2011   antral erosions, chronic active gastritis, +H.pylori  . Spina bifida occulta     Past Surgical History:  Procedure Laterality Date  . CHOLECYSTECTOMY    . COLONOSCOPY N/A 05/21/2015   CHE:NIDP canal hemorrhoids otherwise normal  . DILATION AND CURETTAGE OF UTERUS    . egd/tcs  09/2009   Rourk-H.Pylori gastritis (s/p tx), benign sb bx, small hh, hemorrhoids, normal TI  . ENDOMETRIAL ABLATION    . HEMORRHOID BANDING    . TUBAL LIGATION      OB History    Gravida Para Term Preterm AB Living   1 1   1        SAB TAB Ectopic Multiple Live Births                  Social History   Social History  . Marital status: Single    Spouse name: N/A  . Number of children: 1  . Years of education: N/A   Occupational History  . disabled     "spinal issues"   Social History Main Topics  . Smoking status: Current Every Day Smoker    Packs/day: 2.50    Years: 23.00    Types: Cigarettes  . Smokeless tobacco: Never Used  . Alcohol use No  . Drug use: No  . Sexual activity: Yes    Birth control/ protection: Surgical   Other Topics Concern  . None   Social History Narrative   Lives w/ grandmother & daughter    Family History  Problem Relation Age of Onset  .  Colon cancer Maternal Grandmother   . Cancer Maternal Grandmother     Breast Cancer  . Breast cancer Maternal Grandmother   . Hypertension Maternal Grandmother   . Diabetes Maternal Grandmother   . Heart disease Maternal Grandmother   . Asthma Mother      Current Outpatient Prescriptions:  .  hydrocortisone (ANUSOL-HC) 2.5 % rectal cream, Place 1 application rectally 2 (two) times daily., Disp: 30 g, Rfl: 1 .  metoCLOPramide (REGLAN) 10 MG tablet, Take 1 tablet (10 mg total) by mouth every 6 (six) hours as needed for nausea (or headache)., Disp: 30 tablet, Rfl: 0 .  naproxen (NAPROSYN) 250 MG tablet, Take 250 mg by mouth 2 (two) times daily with a meal.  , Disp: , Rfl:  .  omeprazole (PRILOSEC) 20 MG capsule, TAKE 1 CAPSULE BY MOUTH EVERY DAY, Disp: 30 capsule, Rfl: 3 .  EPINEPHrine (EPIPEN IJ), Inject as directed as needed., Disp: , Rfl:   Review of Systems  Review of Systems  Constitutional: Negative for fever, chills, weight loss, malaise/fatigue and diaphoresis.  HENT: Negative for hearing loss, ear pain, nosebleeds, congestion, sore throat, neck pain, tinnitus and  ear discharge.   Eyes: Negative for blurred vision, double vision, photophobia, pain, discharge and redness.  Respiratory: Negative for cough, hemoptysis, sputum production, shortness of breath, wheezing and stridor.   Cardiovascular: Negative for chest pain, palpitations, orthopnea, claudication, leg swelling and PND.  Gastrointestinal: negative for abdominal pain. Negative for heartburn, nausea, vomiting, diarrhea, constipation, blood in stool and melena.  Genitourinary: Negative for dysuria, urgency, frequency, hematuria and flank pain.  Musculoskeletal: Negative for myalgias, back pain, joint pain and falls.  Skin: Negative for itching and rash.  Neurological: Negative for dizziness, tingling, tremors, sensory change, speech change, focal weakness, seizures, loss of consciousness, weakness and headaches.   Endo/Heme/Allergies: Negative for environmental allergies and polydipsia. Does not bruise/bleed easily.  Psychiatric/Behavioral: Negative for depression, suicidal ideas, hallucinations, memory loss and substance abuse. The patient is not nervous/anxious and does not have insomnia.        Objective:  Blood pressure 100/60, pulse 80, weight 181 lb (82.1 kg).   Physical Exam  Vitals reviewed. Constitutional: She is oriented to person, place, and time. She appears well-developed and well-nourished.  HENT:  Head: Normocephalic and atraumatic.        Right Ear: External ear normal.  Left Ear: External ear normal.  Nose: Nose normal.  Mouth/Throat: Oropharynx is clear and moist.  Eyes: Conjunctivae and EOM are normal. Pupils are equal, round, and reactive to light. Right eye exhibits no discharge. Left eye exhibits no discharge. No scleral icterus.  Neck: Normal range of motion. Neck supple. No tracheal deviation present. No thyromegaly present.  Cardiovascular: Normal rate, regular rhythm, normal heart sounds and intact distal pulses.  Exam reveals no gallop and no friction rub.   No murmur heard. Respiratory: Effort normal and breath sounds normal. No respiratory distress. She has no wheezes. She has no rales. She exhibits no tenderness.  GI: Soft. Bowel sounds are normal. She exhibits no distension and no mass. There is no tenderness. There is no rebound and no guarding.  Genitourinary:  Breasts no masses skin changes or nipple changes bilaterally      Vulva is normal without lesions Vagina is pink moist without discharge Cervix normal in appearance and pap is done Uterus is normal size shape and contour Adnexa is negative with normal sized ovaries   Musculoskeletal: Normal range of motion. She exhibits no edema and no tenderness.  Neurological: She is alert and oriented to person, place, and time. She has normal reflexes. She displays normal reflexes. No cranial nerve deficit. She  exhibits normal muscle tone. Coordination normal.  Skin: Skin is warm and dry. No rash noted. No erythema. No pallor.  Psychiatric: She has a normal mood and affect. Her behavior is normal. Judgment and thought content normal.       Medications Ordered at today's visit: No orders of the defined types were placed in this encounter.   Other orders placed at today's visit: No orders of the defined types were placed in this encounter.     Assessment:    Healthy female exam.    Plan:    Contraception: tubal ligation. Mammogram ordered. Follow up in: 1 weeks.     Return in about 1 year (around 11/26/2017) for yearly, with Dr Elonda Husky.

## 2016-12-01 LAB — CYTOLOGY - PAP
DIAGNOSIS: NEGATIVE
HPV: NOT DETECTED

## 2016-12-10 IMAGING — DX DG ANKLE COMPLETE 3+V*R*
3 series · 3 of 3 positions shown · non-contrast
Comparison: None.

CLINICAL DATA: Patient with pain and swelling in the right ankle
for 3 days. No known injury. Initial encounter.

EXAM:
RIGHT ANKLE - COMPLETE 3+ VIEW

[ankle ap]
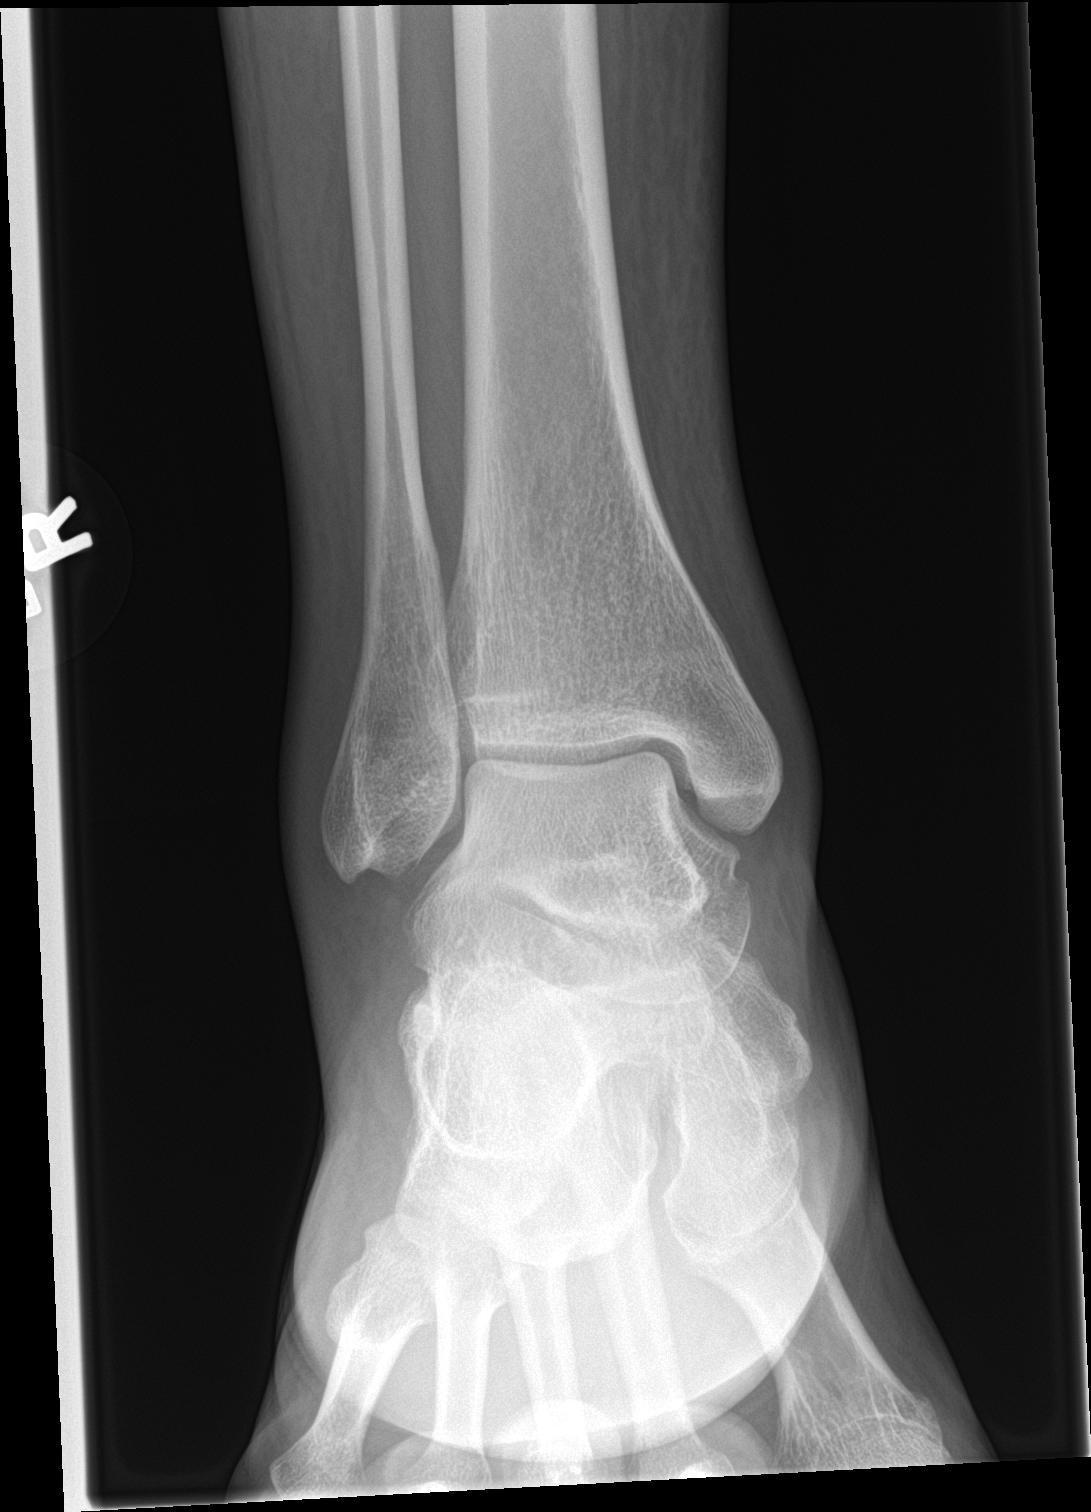

[ankle obl]
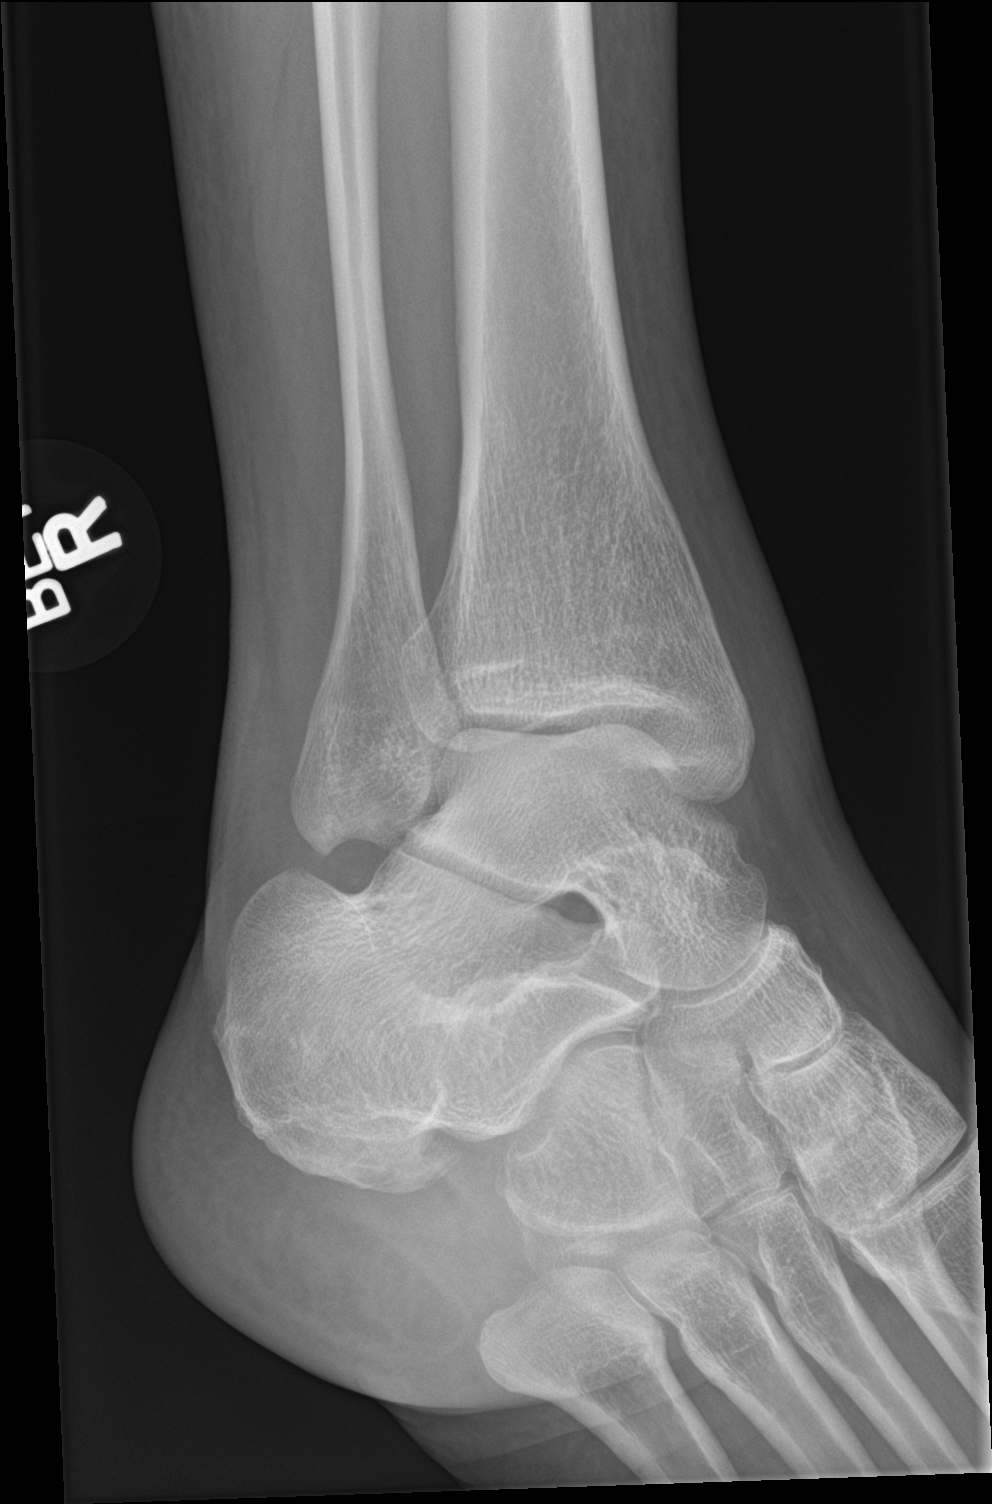

[ankle lat]
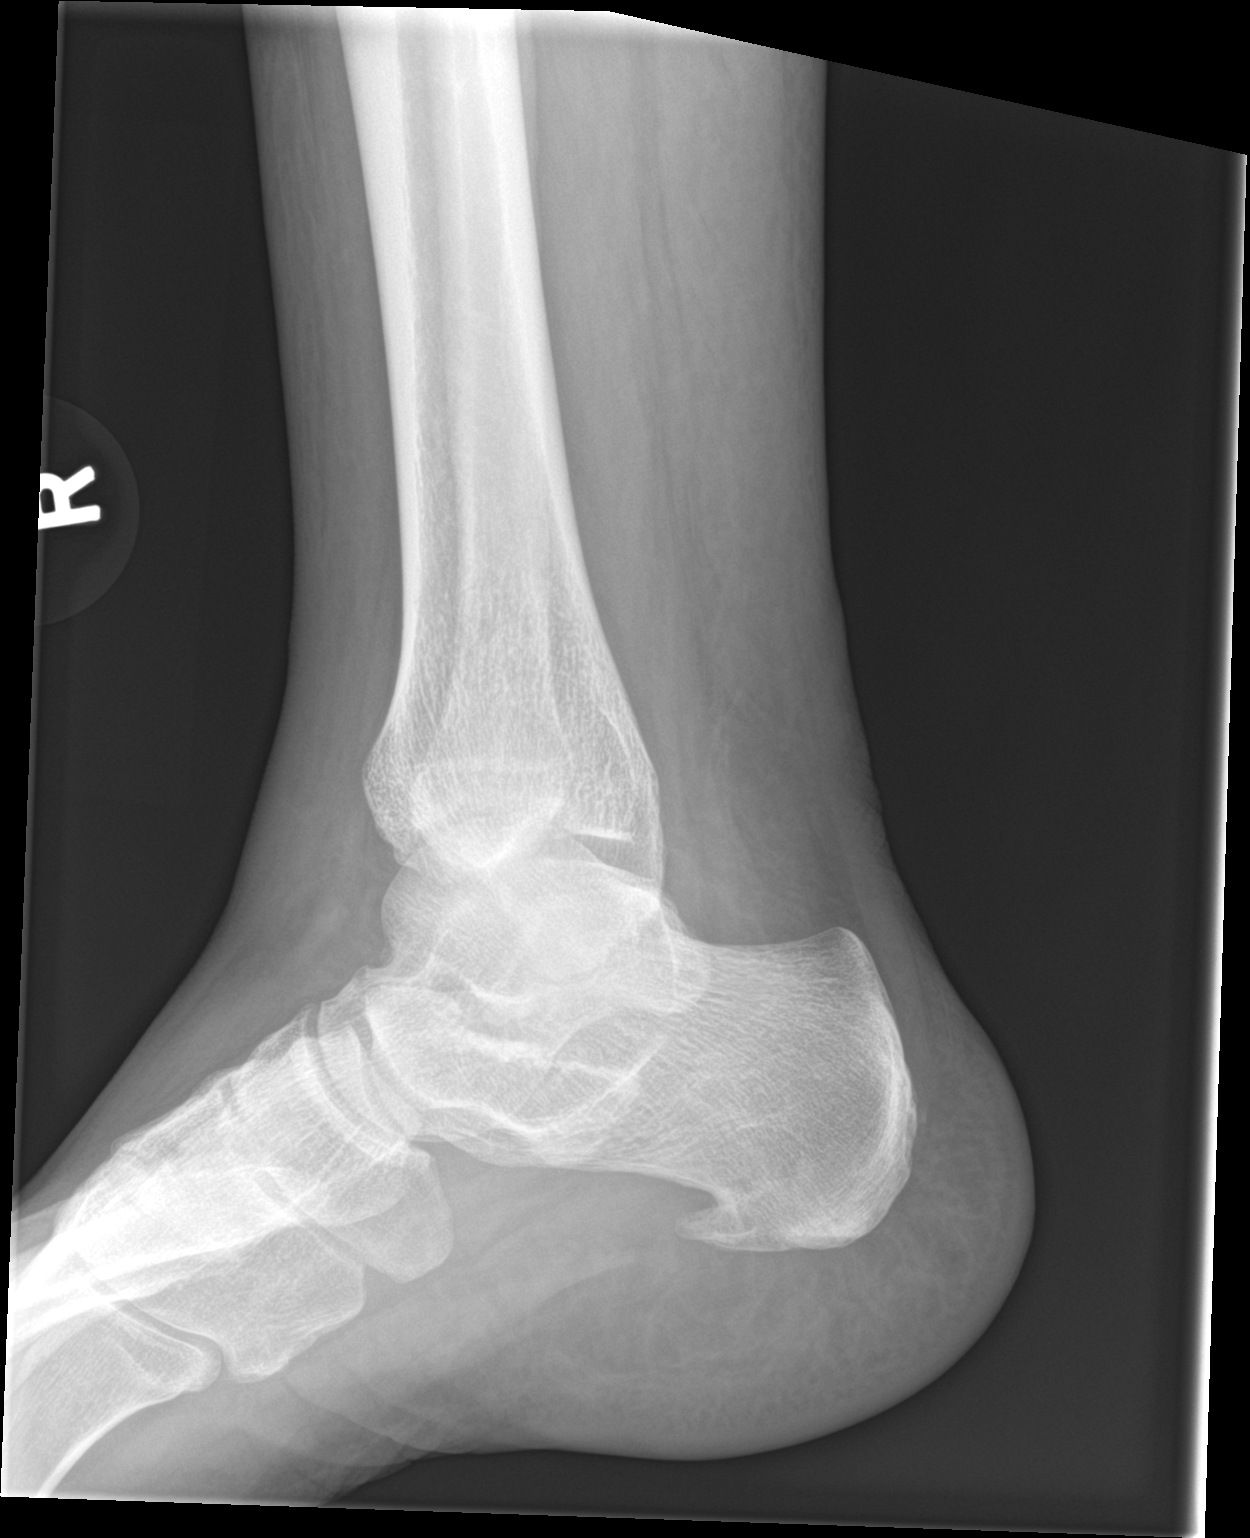

[3 of 3 positions shown; findings below may reference images not displayed]

FINDINGS: Normal anatomic alignment. No evidence for acute fracture or
dislocation. Soft tissue swelling. Plantar calcaneal spurring.
IMPRESSION: No acute osseous abnormality.

## 2016-12-28 ENCOUNTER — Encounter: Payer: Self-pay | Admitting: Physician Assistant

## 2016-12-28 ENCOUNTER — Ambulatory Visit: Payer: Medicaid Other | Admitting: Physician Assistant

## 2016-12-28 VITALS — BP 100/70 | HR 78 | Ht 64.25 in | Wt 185.5 lb

## 2016-12-28 DIAGNOSIS — E669 Obesity, unspecified: Secondary | ICD-10-CM

## 2016-12-28 DIAGNOSIS — K219 Gastro-esophageal reflux disease without esophagitis: Secondary | ICD-10-CM

## 2016-12-28 DIAGNOSIS — B9689 Other specified bacterial agents as the cause of diseases classified elsewhere: Secondary | ICD-10-CM

## 2016-12-28 DIAGNOSIS — F17210 Nicotine dependence, cigarettes, uncomplicated: Secondary | ICD-10-CM

## 2016-12-28 DIAGNOSIS — N760 Acute vaginitis: Secondary | ICD-10-CM

## 2016-12-28 MED ORDER — OMEPRAZOLE 20 MG PO CPDR: DELAYED_RELEASE_CAPSULE | ORAL | 5 refills | Status: DC

## 2016-12-28 MED ORDER — METRONIDAZOLE 500 MG PO TABS: 500.0000 mg | ORAL_TABLET | Freq: Two times a day (BID) | ORAL | 0 refills | Status: AC

## 2016-12-28 NOTE — Progress Notes (Signed)
BP 100/70 (BP Location: Left Arm, Patient Position: Sitting, Cuff Size: Normal)   Pulse 78   Ht 5' 4.25" (1.632 m)   Wt 185 lb 8 oz (84.1 kg)   SpO2 98%   BMI 31.59 kg/m    Subjective:    Patient ID: Alyssa Aguilar, female    DOB: 08-29-1970, 46 y.o.   MRN: [id number]  HPI: Alyssa Aguilar is a 46 y.o. female presenting on 12/28/2016 for Gastroesophageal Reflux   HPI   Pt doing well.  She is still smoking but has cut back.    Pt says Her GERD is well controlled with the omeprazole.   Pt states some vaginal odor.  Recent PAP shows BV.   Pt says she hasn't gotten results from PAP test yet.   Relevant past medical, surgical, family and social history reviewed and updated as indicated. Interim medical history since our last visit reviewed. Allergies and medications reviewed and updated.   Current Outpatient Prescriptions:  .  EPINEPHrine (EPIPEN IJ), Inject as directed as needed., Disp: , Rfl:  .  hydrocortisone (ANUSOL-HC) 2.5 % rectal cream, Place 1 application rectally 2 (two) times daily., Disp: 30 g, Rfl: 1 .  omeprazole (PRILOSEC) 20 MG capsule, TAKE 1 CAPSULE BY MOUTH EVERY DAY, Disp: 30 capsule, Rfl: 3   Review of Systems  Constitutional: Negative for appetite change, chills, diaphoresis, fatigue, fever and unexpected weight change.  HENT: Positive for dental problem. Negative for congestion, drooling, ear pain, facial swelling, hearing loss, mouth sores, sneezing, sore throat, trouble swallowing and voice change.   Eyes: Negative for pain, discharge, redness, itching and visual disturbance.  Respiratory: Negative for cough, choking, shortness of breath and wheezing.   Cardiovascular: Positive for leg swelling. Negative for chest pain and palpitations.  Gastrointestinal: Positive for constipation. Negative for abdominal pain, blood in stool, diarrhea and vomiting.  Endocrine: Negative for cold intolerance, heat intolerance and polydipsia.  Genitourinary: Negative for  decreased urine volume, dysuria and hematuria.  Musculoskeletal: Positive for arthralgias and back pain. Negative for gait problem.  Skin: Negative for rash.  Allergic/Immunologic: Positive for environmental allergies.  Neurological: Positive for headaches. Negative for seizures, syncope and light-headedness.  Hematological: Negative for adenopathy.  Psychiatric/Behavioral: Negative for agitation, dysphoric mood and suicidal ideas. The patient is not nervous/anxious.     Per HPI unless specifically indicated above     Objective:    BP 100/70 (BP Location: Left Arm, Patient Position: Sitting, Cuff Size: Normal)   Pulse 78   Ht 5' 4.25" (1.632 m)   Wt 185 lb 8 oz (84.1 kg)   SpO2 98%   BMI 31.59 kg/m   Wt Readings from Last 3 Encounters:  12/28/16 185 lb 8 oz (84.1 kg)  11/26/16 181 lb (82.1 kg)  10/26/16 180 lb (81.6 kg)    Physical Exam  Constitutional: She is oriented to person, place, and time. She appears well-developed and well-nourished.  HENT:  Head: Normocephalic and atraumatic.  Neck: Neck supple.  Cardiovascular: Normal rate and regular rhythm.   Pulmonary/Chest: Effort normal and breath sounds normal.  Abdominal: Soft. Bowel sounds are normal. She exhibits no mass. There is no hepatosplenomegaly. There is no tenderness.  Musculoskeletal: She exhibits no edema.  Lymphadenopathy:    She has no cervical adenopathy.  Neurological: She is alert and oriented to person, place, and time.  Skin: Skin is warm and dry.  Psychiatric: She has a normal mood and affect. Her behavior is normal.  Vitals reviewed.  Assessment & Plan:   Encounter Diagnoses  Name Primary?  . Gastroesophageal reflux disease, esophagitis presence not specified Yes  . Cigarette nicotine dependence without complication   . BV (bacterial vaginosis)   . Obesity, unspecified classification, unspecified obesity type, unspecified whether serious comorbidity present     -reviewed results of  recent PAP done at [facility] with pt.  rx flagyl for BV -pt to Continue omeprazole for GERD -counseled pt on smoking cessation -pt to follow up in 6 months. RTO sooner prn

## 2017-02-07 ENCOUNTER — Ambulatory Visit: Payer: Medicaid Other | Admitting: Physician Assistant

## 2017-04-12 ENCOUNTER — Encounter: Payer: Self-pay | Admitting: Nurse Practitioner

## 2017-04-12 ENCOUNTER — Ambulatory Visit (INDEPENDENT_AMBULATORY_CARE_PROVIDER_SITE_OTHER): Payer: Medicaid Other | Admitting: Nurse Practitioner

## 2017-04-12 VITALS — BP 113/69 | HR 65 | Temp 97.8°F | Ht 64.0 in | Wt 179.0 lb

## 2017-04-12 DIAGNOSIS — R109 Unspecified abdominal pain: Secondary | ICD-10-CM | POA: Insufficient documentation

## 2017-04-12 DIAGNOSIS — R1030 Lower abdominal pain, unspecified: Secondary | ICD-10-CM

## 2017-04-12 DIAGNOSIS — K625 Hemorrhage of anus and rectum: Secondary | ICD-10-CM

## 2017-04-12 DIAGNOSIS — K649 Unspecified hemorrhoids: Secondary | ICD-10-CM | POA: Diagnosis not present

## 2017-04-12 DIAGNOSIS — K5900 Constipation, unspecified: Secondary | ICD-10-CM | POA: Diagnosis not present

## 2017-04-12 DIAGNOSIS — K219 Gastro-esophageal reflux disease without esophagitis: Secondary | ICD-10-CM | POA: Diagnosis not present

## 2017-04-12 NOTE — Assessment & Plan Note (Signed)
The patient describes lower abdominal crampy pain which is intermittent in severity and frequency. Her pain improves after a bowel movement. Pain is likely due to constipation. See above for further information related to addressing her constipation.

## 2017-04-12 NOTE — Assessment & Plan Note (Signed)
Noted rectal bleeding which is worse when she is constipated. Upon reviewing her colonoscopy with her and independently it appears she has anal canal hemorrhoids which are deemed internal. She would likely be a good candidate for hemorrhoid banding. She has verbalized interest in undergoing an evaluation and possible banding. We will schedule this for her. Recommend she continue her prescription Anusol rectal cream to help keep her hemorrhoid symptoms as controlled as possible, in addition to constipation measures as per below.

## 2017-04-12 NOTE — Assessment & Plan Note (Signed)
Her GERD symptoms are well controlled on PPI. Recommend she continue her prescription PPI to keep her GERD symptoms managed.

## 2017-04-12 NOTE — Patient Instructions (Signed)
1. Into need using your hemorrhoid cream to help keep your hemorrhoids controlled. 2. Take Colace stool softener once a day. If you're still constipated when taking it once a day, increase it to twice a day. 3. We will schedule an appointment with Dr. Gala Romney for possible hemorrhoid banding. 4. Call if you have any questions or concerns.

## 2017-04-12 NOTE — Assessment & Plan Note (Signed)
The patient has intermittent constipation which occasionally flares to increased frequency which is likely exacerbating her hemorrhoid flares as well as her abdominal pain flares. At this point her constipation is not severe and not daily. I will start her on Colace once a day. I will have her increase this to twice a day if she is still constipated on once a day dosing. No other red flag/warning signs or symptoms. Colonoscopy up-to-date.

## 2017-04-12 NOTE — Progress Notes (Addendum)
Referring Provider: Soyla Dryer, PA-C Primary Care Physician:  Rosita Fire, MD Primary GI:  Dr. Gala Romney  Chief Complaint  Patient presents with  . Hemorrhoids    bleeding sometimes  . Constipation    HPI:   Alyssa Aguilar is a 46 y.o. female who presents for follow-up on hemorrhoids. The patient was last seen in our office 03/23/2016 for rectal bleeding and hemorrhoids. At her last visit she stated her hemorrhoid started flaring 3 days prior with pain irritation and some scant toilet tissue hematochezia. She was out of hemorrhoid cream but by the time she got to her appointment her symptoms have resolved. Noted right lower quadrant abdominal pain which is intermittent despite daily bowel movements. Stools pass easily but occasionally has to strain. No other GI symptoms. A refill of her hemorrhoid cream was sent to the pharmacy, call if hemorrhoids continue to be an issue and we could discuss other options, return for follow-up as needed.  Reviewed previous colonoscopy. I discussed the colonoscopy with the endoscopist, Dr. Gala Romney. Colonoscopy up-to-date 05/21/2015 which found anal canal hemorrhoids as likely source of hematochezia, otherwise normal exam. She was given 1 month course of [place] suppositories at bedtime and recommended to finish those, call with any problems and consider Anusol cream or suppositories as needed.  Today she states she's having a hemorrhoid flare since last week. Has intermittent constipation, is not on any constipation medications. Also with scant toilet tissue hematochezia with every bowel movement during flare and intermittently when not having a flare. Has intermittent lower abdominal cramping when constipated, improves with a bowel movement. GERD well controlled on PPI without noted recurrent flare up recently. Denies N/V, melena, acute changes in bowel habits, fever, chills. Denies chest pain, dyspnea, dizziness, lightheadedness, syncope, near syncope. Denies  any other upper or lower GI symptoms.  Discussed colonoscopy with the patient for reassurance.  Past Medical History:  Diagnosis Date  . GERD (gastroesophageal reflux disease)   . Helicobacter pylori gastritis 2011   s/p treatment with Prevpac  . Hemorrhoids   . IBS (irritable bowel syndrome)   . Migraines   . S/P colonoscopy March 2011   internal hemorrhoids  . S/P endoscopy March 2011   antral erosions, chronic active gastritis, +H.pylori  . Spina bifida occulta     Past Surgical History:  Procedure Laterality Date  . CHOLECYSTECTOMY    . COLONOSCOPY N/A 05/21/2015   RUE:AVWU canal hemorrhoids otherwise normal  . DILATION AND CURETTAGE OF UTERUS    . egd/tcs  09/2009   Rourk-H.Pylori gastritis (s/p tx), benign sb bx, small hh, hemorrhoids, normal TI  . ENDOMETRIAL ABLATION    . HEMORRHOID BANDING    . TUBAL LIGATION      Current Outpatient Prescriptions  Medication Sig Dispense Refill  . EPINEPHrine (EPIPEN IJ) Inject as directed as needed.    . hydrocortisone (ANUSOL-HC) 2.5 % rectal cream Place 1 application rectally 2 (two) times daily. 30 g 1  . omeprazole (PRILOSEC) 20 MG capsule TAKE 1 CAPSULE BY MOUTH EVERY DAY 30 capsule 5   No current facility-administered medications for this visit.     Allergies as of 04/12/2017 - Review Complete 04/12/2017  Allergen Reaction Noted  . Bee venom Anaphylaxis 11/06/2011  . Acetaminophen Swelling and Other (See Comments)   . Codeine Other (See Comments)     Family History  Problem Relation Age of Onset  . Colon cancer Maternal Grandmother   . Cancer Maternal Grandmother  Breast Cancer  . Breast cancer Maternal Grandmother   . Hypertension Maternal Grandmother   . Diabetes Maternal Grandmother   . Heart disease Maternal Grandmother   . Asthma Mother   . Colon cancer Maternal Uncle     Social History   Social History  . Marital status: Single    Spouse name: N/A  . Number of children: 1  . Years of  education: N/A   Occupational History  . disabled     "spinal issues"   Social History Main Topics  . Smoking status: Current Every Day Smoker    Packs/day: 2.50    Years: 23.00    Types: Cigarettes  . Smokeless tobacco: Never Used     Comment: has cut down 1/2 ppd  . Alcohol use No  . Drug use: No  . Sexual activity: Yes    Birth control/ protection: Surgical   Other Topics Concern  . None   Social History Narrative   Lives w/ grandmother & daughter    Review of Systems: General: Negative for anorexia, weight loss, fever, chills, fatigue, weakness. ENT: Negative for hoarseness, difficulty swallowing. CV: Negative for chest pain, angina, palpitations, peripheral edema.  Respiratory: Negative for dyspnea at rest, cough, sputum, wheezing.  GI: See history of present illness. Endo: Negative for unusual weight change.  Heme: Negative for bruising or bleeding.   Physical Exam: BP 113/69   Pulse 65   Temp 97.8 F (36.6 C) (Oral)   Ht 5\' 4"  (1.626 m)   Wt 179 lb (81.2 kg)   BMI 30.73 kg/m  General:   Alert and oriented. Pleasant and cooperative. Well-nourished and well-developed.  Eyes:  Without icterus, sclera clear and conjunctiva pink.  Ears:  Normal auditory acuity. Cardiovascular:  S1, S2 present without murmurs appreciated. Extremities without clubbing or edema. Respiratory:  Clear to auscultation bilaterally. No wheezes, rales, or rhonchi. No distress.  Gastrointestinal:  +BS, rounded but soft, non-tender and non-distended. No HSM noted. No guarding or rebound. No masses appreciated.  Rectal:  Deferred  Musculoskalatal:  Symmetrical without gross deformities. Neurologic:  Alert and oriented x4;  grossly normal neurologically. Psych:  Alert and cooperative. Normal mood and affect. Heme/Lymph/Immune: No excessive bruising noted.    04/12/2017 9:45 AM   Disclaimer: This note was dictated with voice recognition software. Similar sounding words can inadvertently  be transcribed and may not be corrected upon review.

## 2017-04-12 NOTE — Progress Notes (Signed)
CC'ED TO PCP 

## 2017-04-12 NOTE — Assessment & Plan Note (Signed)
Noted hemorrhoids on previously reviewed colonoscopy. She has been using Anusol rectal cream but still has hemorrhoid flares, likely related to constipation. I discussed the etiology and and its association with hemorrhoid flare symptoms. See below for constipation management. She is interested in hemorrhoid banding and we will schedule an evaluation and possible banding with Dr. Gala Romney. Recommend she continue her prescription medications including Anusol and prescription PPI which we prescribed for her. Follow-up with hemorrhoid banding.

## 2017-05-17 ENCOUNTER — Ambulatory Visit (INDEPENDENT_AMBULATORY_CARE_PROVIDER_SITE_OTHER): Payer: Medicaid Other | Admitting: Internal Medicine

## 2017-05-17 ENCOUNTER — Encounter: Payer: Self-pay | Admitting: Internal Medicine

## 2017-05-17 VITALS — BP 96/69 | HR 59 | Temp 97.3°F | Ht 64.0 in | Wt 178.8 lb

## 2017-05-17 DIAGNOSIS — K5904 Chronic idiopathic constipation: Secondary | ICD-10-CM

## 2017-05-17 DIAGNOSIS — K648 Other hemorrhoids: Secondary | ICD-10-CM

## 2017-05-17 DIAGNOSIS — K649 Unspecified hemorrhoids: Secondary | ICD-10-CM

## 2017-05-17 NOTE — Progress Notes (Signed)
.  [place] banding procedure note:  The patient presents with symptomatic   status post banding of the right anterior left lateral hemorrhoid 2015. Significant improvement in symptoms but now recurrence in bleeding difficulties with hygiene/wiping. Constipation-recurrent. Has to strain on a regular basis to have a bowel movement. Using various OTC remedies. Has not tried Linzess.  Patient requests  band ligation of her hemorrhoidal disease. All risks, benefits, and alternative forms of therapy were described and informed consent was obtained.  In the left lateral decubitus position,  a DRE revealed no abnormalities. Topical nitroglycerin and Xylocaine used as lubricant.  The decision was made to band the right posterior internal hemorrhoid;  the [place] was used to perform band ligation without complication. Digital anorectal examination was then performed to assure proper positioning of the band;  [place] next position. No pinching or pain. I elected to place a second band in the neutral position.  Follow-up DRE revealed the second band ending up in the right lateral area. No pinching or pain. The patient was discharged home without pain or other issues. Dietary and behavioral recommendations were given.  Samples of Linzess 72-1 capsule daily provided.   The patient will return in 4 weeks for followup and possible additional banding as required.  No complications were encountered and the patient tolerated the procedure well.

## 2017-05-17 NOTE — Patient Instructions (Signed)
Avoid straining.  Begin linzess 72 - once capsule daily - samplews provided  Limit toilet time to 5 minutes  Call with any interim problems  Schedule followup appointment in 4  weeks from now

## 2017-06-17 ENCOUNTER — Encounter: Payer: Self-pay | Admitting: Internal Medicine

## 2017-06-17 ENCOUNTER — Ambulatory Visit (INDEPENDENT_AMBULATORY_CARE_PROVIDER_SITE_OTHER): Payer: Medicaid Other | Admitting: Internal Medicine

## 2017-06-17 ENCOUNTER — Other Ambulatory Visit: Payer: Self-pay

## 2017-06-17 VITALS — BP 115/72 | HR 71 | Temp 97.9°F | Ht 64.0 in | Wt 181.6 lb

## 2017-06-17 DIAGNOSIS — K625 Hemorrhage of anus and rectum: Secondary | ICD-10-CM

## 2017-06-17 DIAGNOSIS — K649 Unspecified hemorrhoids: Secondary | ICD-10-CM

## 2017-06-17 DIAGNOSIS — K648 Other hemorrhoids: Secondary | ICD-10-CM

## 2017-06-17 MED ORDER — HYDROCORTISONE 2.5 % RE CREA: 1.0000 "application " | TOPICAL_CREAM | Freq: Two times a day (BID) | RECTAL | 2 refills | Status: DC

## 2017-06-17 NOTE — Patient Instructions (Signed)
Avoid straining.  Benefiber 1 tablespoon twice daily  Limit toilet time to 5 minutes  Call with any interim problems  Schedule followup appointment in 3 months from now  New prescription for Anusol cream-dispense one unit.  Apply to the anorectum twice daily as needed. 2 refills.

## 2017-06-17 NOTE — Progress Notes (Signed)
[place] banding procedure note:  The patient presents with symptomatic grade 2/3 hemorrhoids unresponsive to maximal medical therapy, requesting rubber band ligation of her hemorrhoidal disease. All risks, benefits, and alternative forms of therapy were described and informed consent was obtained.  In the left lateral decubitus position,  DRE was performed utilizing topical Xylocaine and nitroglycerin ointment. It was negative.  The decision was made to band the right lateral internal hemorrhoid; the [place] was used to perform band ligation without complication. Digital anorectal examination was then performed to assure proper positioning of the band, and to adjust the banded tissue as required. Band found to be in excellent position. Decision was made to band the left lateral hemorrhoid column. This was also done without difficulty. Follow-up DRE revealed the band to be in excellent position. No pinching or pain following these maneuvers.  The patient was discharged home without pain or other issues. Dietary and behavioral recommendations were given;  Prescription for Anusol cream for occasional anorectal irritation along with follow-up instructions. The patient will return in 3 months.  No complications were encountered and the patient tolerated the procedure well.

## 2017-06-30 ENCOUNTER — Ambulatory Visit: Payer: Medicaid Other | Admitting: Physician Assistant

## 2017-07-30 ENCOUNTER — Emergency Department (HOSPITAL_COMMUNITY)
Admission: EM | Admit: 2017-07-30 | Discharge: 2017-07-30 | Disposition: A | Discharge status: Home / Self Care | Payer: Medicaid Other

## 2017-08-08 DIAGNOSIS — R51 Headache: Secondary | ICD-10-CM | POA: Diagnosis present

## 2017-08-08 DIAGNOSIS — Z79899 Other long term (current) drug therapy: Secondary | ICD-10-CM | POA: Insufficient documentation

## 2017-08-08 DIAGNOSIS — F17210 Nicotine dependence, cigarettes, uncomplicated: Secondary | ICD-10-CM | POA: Insufficient documentation

## 2017-08-08 DIAGNOSIS — Z7982 Long term (current) use of aspirin: Secondary | ICD-10-CM | POA: Insufficient documentation

## 2017-08-09 ENCOUNTER — Other Ambulatory Visit: Payer: Self-pay

## 2017-08-09 ENCOUNTER — Emergency Department (HOSPITAL_COMMUNITY)
Admission: EM | Admit: 2017-08-09 | Discharge: 2017-08-09 | Disposition: A | Discharge status: Home / Self Care | Payer: Medicaid Other | Attending: Emergency Medicine | Admitting: Emergency Medicine

## 2017-08-09 ENCOUNTER — Encounter (HOSPITAL_COMMUNITY): Payer: Self-pay | Admitting: Emergency Medicine

## 2017-08-09 DIAGNOSIS — R519 Headache, unspecified: Secondary | ICD-10-CM

## 2017-08-09 DIAGNOSIS — R51 Headache: Secondary | ICD-10-CM

## 2017-08-09 MED ORDER — PROCHLORPERAZINE EDISYLATE 5 MG/ML IJ SOLN
10.0000 mg | Freq: Once | INTRAMUSCULAR | Status: AC
  Administered 2017-08-09: 10 mg via INTRAVENOUS
  Filled 2017-08-09: qty 2

## 2017-08-09 MED ORDER — DIPHENHYDRAMINE HCL 50 MG/ML IJ SOLN
25.0000 mg | Freq: Once | INTRAMUSCULAR | Status: AC
  Administered 2017-08-09: 25 mg via INTRAVENOUS
  Filled 2017-08-09: qty 1

## 2017-08-09 MED ORDER — SODIUM CHLORIDE 0.9 % IV BOLUS (SEPSIS)
1000.0000 mL | Freq: Once | INTRAVENOUS | Status: AC
  Administered 2017-08-09: 1000 mL via INTRAVENOUS

## 2017-08-09 MED ORDER — DEXAMETHASONE SODIUM PHOSPHATE 10 MG/ML IJ SOLN
10.0000 mg | Freq: Once | INTRAMUSCULAR | Status: AC
  Administered 2017-08-09: 10 mg via INTRAVENOUS
  Filled 2017-08-09: qty 1

## 2017-08-09 NOTE — ED Triage Notes (Signed)
Pt c/o migraine today with vomiting x 3 episodes, saw her PCP today and was given RX but was unable to have it filled yet

## 2017-08-09 NOTE — ED Provider Notes (Signed)
[facility] EMERGENCY DEPARTMENT Provider Note   CSN: [id number] Arrival date & time: 08/08/17  2345     History   Chief Complaint Chief Complaint  Patient presents with  . Migraine    HPI Media Alyssa Aguilar is a 47 y.o. female with a history of chronic headaches, hematochezia 2/2 hemorrhoids and GERD who presents to the emergency department with a chief complaint of global headache.  Alyssa Aguilar reports the headache began 1 week ago and has been intermittent since onset.  Alyssa Aguilar characterizes the pain is throbbing. Alyssa Aguilar reports associated emesis, including 3 episodes of NBNB today.  Alyssa Aguilar was seen earlier today by her primary care provider, but has not been able to fill the medication that Alyssa Aguilar was given at the appointment.  Alyssa Aguilar reports a history of numbness and tingling to her bilateral wrist and hands along with her headaches, which is also present today.  Alyssa Aguilar reports associated photophobia and photophobia.  Alyssa Aguilar denies visual changes, dizziness, tinnitus, hearing loss, or weakness.  No recent falls or injuries.  Alyssa Aguilar reports that the headache today is consistent with her chronic headaches, but more severe.  Alyssa Aguilar has treated her symptoms with Goody powder without improvement.  The history is provided by the patient. No language interpreter was used.    Past Medical History:  Diagnosis Date  . GERD (gastroesophageal reflux disease)   . Helicobacter pylori gastritis 2011   s/p treatment with Prevpac  . Hemorrhoids   . IBS (irritable bowel syndrome)   . Migraines   . S/P colonoscopy March 2011   internal hemorrhoids  . S/P endoscopy March 2011   antral erosions, chronic active gastritis, +H.pylori  . Spina bifida occulta     Patient Active Problem List   Diagnosis Date Noted  . Abdominal pain 04/12/2017  . Hemorrhoid   . Trichomonas vaginitis 10/03/2014  . Hemorrhoids 12/09/2010  . IBS (irritable bowel syndrome) 12/09/2010  . GERD 09/10/2009  . Constipation 09/10/2009  . RECTAL BLEEDING  09/10/2009  . EPIGASTRIC PAIN 09/10/2009  . ANEMIA, IRON DEFICIENCY, HX OF 09/10/2009  . BEE STING ALLERGY 09/10/2009    Past Surgical History:  Procedure Laterality Date  . CHOLECYSTECTOMY    . COLONOSCOPY N/A 05/21/2015   ASN:KNLZ canal hemorrhoids otherwise normal  . DILATION AND CURETTAGE OF UTERUS    . egd/tcs  09/2009   Rourk-H.Pylori gastritis (s/p tx), benign sb bx, small hh, hemorrhoids, normal TI  . ENDOMETRIAL ABLATION    . HEMORRHOID BANDING    . TUBAL LIGATION      OB History    Gravida Para Term Preterm AB Living   1 1   1        SAB TAB Ectopic Multiple Live Births                   Home Medications    Prior to Admission medications   Medication Sig Start Date End Date Taking? Authorizing Provider  Aspirin-Acetaminophen-Caffeine (EXCEDRIN MIGRAINE PO) Take by mouth as needed.   Yes [provider]  Aspirin-Acetaminophen-Caffeine (GOODY HEADACHE PO) Take by mouth as needed.   Yes [provider]  linaclotide (LINZESS) 72 MCG capsule Take 72 mcg as needed by mouth.   Yes [provider]  omeprazole (PRILOSEC) 20 MG capsule TAKE 1 CAPSULE BY MOUTH EVERY DAY 12/28/16  Yes Soyla Dryer, PA-C  EPINEPHrine (EPIPEN IJ) Inject as directed as needed.    [provider]  hydrocortisone (ANUSOL-HC) 2.5 % rectal cream Place 1 application 2 (  two) times daily rectally. 06/17/17   Rourk, Cristopher Estimable, MD    Family History Family History  Problem Relation Age of Onset  . Colon cancer Maternal Grandmother   . Cancer Maternal Grandmother        Breast Cancer  . Breast cancer Maternal Grandmother   . Hypertension Maternal Grandmother   . Diabetes Maternal Grandmother   . Heart disease Maternal Grandmother   . Asthma Mother   . Colon cancer Maternal Uncle     Social History Social History   Tobacco Use  . Smoking status: Current Every Day Smoker    Packs/day: 2.50    Years: 23.00    Pack years: 57.50    Types: Cigarettes  .  Smokeless tobacco: Never Used  . Tobacco comment: has cut down 1/2 ppd  Substance Use Topics  . Alcohol use: No  . Drug use: No     Allergies   Bee venom; Acetaminophen; and Codeine   Review of Systems Review of Systems  Constitutional: Negative for activity change, chills and fever.  HENT: Negative for sinus pressure, sinus pain and tinnitus.   Eyes: Negative for visual disturbance.  Respiratory: Negative for shortness of breath.   Cardiovascular: Negative for chest pain.  Gastrointestinal: Positive for vomiting. Negative for abdominal pain, diarrhea and nausea.  Genitourinary: Negative for dysuria.  Musculoskeletal: Negative for back pain, gait problem, neck pain and neck stiffness.  Skin: Negative for rash.  Neurological: Positive for headaches. Negative for dizziness, weakness and numbness.  Psychiatric/Behavioral: Negative for confusion.     Physical Exam Updated Vital Signs BP 121/74 (BP Location: Left Arm)   Pulse 73   Temp 98.5 F (36.9 C) (Oral)   Resp 18   Ht 5\' 4"  (1.626 m)   Wt 81.6 kg (180 lb)   SpO2 99%   BMI 30.90 kg/m   Physical Exam  Constitutional: Alyssa Aguilar is oriented to person, place, and time. No distress.  HENT:  Head: Normocephalic.  No tenderness to palpation over the bilateral temporal arteries.  Eyes: Conjunctivae and EOM are normal. Pupils are equal, round, and reactive to light.  Neck: Normal range of motion. Neck supple.  No meningismus.  Cardiovascular: Normal rate, regular rhythm, normal heart sounds and intact distal pulses. Exam reveals no gallop and no friction rub.  No murmur heard. Pulmonary/Chest: Effort normal and breath sounds normal. No stridor. No respiratory distress. Alyssa Aguilar has no wheezes. Alyssa Aguilar has no rales. Alyssa Aguilar exhibits no tenderness.  Abdominal: Soft. Bowel sounds are normal. Alyssa Aguilar exhibits no distension. There is no tenderness.  Neurological: Alyssa Aguilar is alert and oriented to person, place, and time.  GCS 15. Cranial nerves 2-12  grossly intact. Finger-to-nose is normal bilaterally. 5/5 motor strength of the bilateral upper and lower extremities. Moves all four extremities. Negative Romberg.  No pronator drift.  Symmetric tandem gait.  Decreased sensation, but equal over the bilateral wrists, which the patient reports is consistent with her previous chronic headaches.  Sensation is otherwise intact   Skin: Skin is warm. Capillary refill takes less than 2 seconds. No rash noted. Alyssa Aguilar is not diaphoretic.  Psychiatric: Her behavior is normal.  Nursing note and vitals reviewed.    ED Treatments / Results  Labs (all labs ordered are listed, but only abnormal results are displayed) Labs Reviewed - No data to display  EKG  EKG Interpretation None       Radiology No results found.  Procedures Procedures (including critical care time)  Medications Ordered in ED  Medications  sodium chloride 0.9 % bolus 1,000 mL (1,000 mLs Intravenous New Bag/Given 08/09/17 0141)  diphenhydrAMINE (BENADRYL) injection 25 mg (25 mg Intravenous Given 08/09/17 0142)  prochlorperazine (COMPAZINE) injection 10 mg (10 mg Intravenous Given 08/09/17 0142)  dexamethasone (DECADRON) injection 10 mg (10 mg Intravenous Given 08/09/17 0142)     Initial Impression / Assessment and Plan / ED Course  I have reviewed the triage vital signs and the nursing notes.  Pertinent labs & imaging results that were available during my care of the patient were reviewed by me and considered in my medical decision making (see chart for details).     47 year old female with a history of chronic headaches, GERD, and hematochezia 2/2 hemorrhoids presenting with intermittent headache and vomiting over the last week.  Alyssa Aguilar reports her symptoms are consistent with her chronic headaches without acute changes, only more severe in intensity.  No focal deficits on neurological exam except for bilateral decreased sensation in the bilateral wrists and hands, which Alyssa Aguilar reports is  consistent with her chronic headaches.  Her headache does not seem consistent with a migraine or cluster HA as it is bilateral and global. Doubt sinus headache, SAH, ICH, meningitis, or pseudotumor cerebri. No pain with palpation over the temporal arteries, doubt temporal arteritis. Possible rebound analgesic headache or tension HA.  Dexamethasone, IV fluids, Benadryl, and Compazine ordered for headache cocktail.  Will reevaluate the patient after Alyssa Aguilar is given headache cocktail for improvement with plan to discharge home. Patient care transferred to Dr. Dina Rich at the end of my shift. Patient presentation, ED course, and plan of care discussed with review of all pertinent labs and imaging. Please see his/her note for further details regarding further ED course and disposition.  Final Clinical Impressions(s) / ED Diagnoses   Final diagnoses:  Bad headache    ED Discharge Orders    None       Joanne Gavel, PA-C 08/09/17 0158    Merryl Hacker, MD 08/09/17 [phone]

## 2017-08-09 NOTE — Discharge Instructions (Addendum)
Please fill the prescriptions that were given to you by your primary care provider during your appointment today.   If you develop new or worsening symptoms including headache that does not respond to your home medications, changes to your vision, neck pain or stiffness, a fever, or other concerning symptoms, please return to the emergency department for evaluation.

## 2017-08-09 NOTE — ED Provider Notes (Signed)
Patient signed out for PA pending recheck after migraine cocktail.  On repeat examination, patient is resting comfortably.  She states she feels much better and is ready for discharge.   Merryl Hacker, MD 08/09/17 [phone]

## 2017-09-20 ENCOUNTER — Ambulatory Visit: Payer: Medicaid Other | Admitting: Nurse Practitioner

## 2017-09-20 ENCOUNTER — Encounter: Payer: Self-pay | Admitting: Nurse Practitioner

## 2017-09-20 DIAGNOSIS — K625 Hemorrhage of anus and rectum: Secondary | ICD-10-CM

## 2017-09-20 DIAGNOSIS — K219 Gastro-esophageal reflux disease without esophagitis: Secondary | ICD-10-CM | POA: Diagnosis not present

## 2017-09-20 DIAGNOSIS — K649 Unspecified hemorrhoids: Secondary | ICD-10-CM

## 2017-09-20 MED ORDER — OMEPRAZOLE 20 MG PO CPDR: 20.0000 mg | DELAYED_RELEASE_CAPSULE | Freq: Two times a day (BID) | ORAL | 3 refills | Status: DC

## 2017-09-20 NOTE — Assessment & Plan Note (Signed)
GERD symptoms have flared up and worsened.  She does intermittently take Excedrin Migraine.  She also takes [place] powders approximately 1 box a day.  I discussed that this is likely the reason behind her worsening GERD symptoms.  I recommended she avoid all NSAIDs and aspirin powders.  I will increase her PPI to twice daily for 3 months.  Follow-up in 3 months and we can consider reducing her dose back to once a day.

## 2017-09-20 NOTE — Patient Instructions (Signed)
1. Increased your acid blocker (Prilosec) to twice a day.  Take this before breakfast and before dinner, on an empty stomach.  We will do this for 3 months. 2. Continue to avoid all NSAIDs (ibuprofen, Motrin, Advil, Aleve, naproxen, Naprosyn, any medication with "NSAID" on the bottle) this includes Excedrin Migraine. 3. Stop taking aspirin powders such as Goody powders and BC powders. 4. The only over the counter pain medicine you can take is Tylenol. 5. Return for follow-up in 3 months. 6. Call us if you have any questions or concerns.

## 2017-09-20 NOTE — Assessment & Plan Note (Signed)
More rectal bleeding since hemorrhoid banding.  Recommend she continue to monitor.  Return for follow-up in 3 months.

## 2017-09-20 NOTE — Progress Notes (Signed)
Referring Provider: Rosita Fire, MD Primary Care Physician:  Rosita Fire, MD Primary GI:  Dr. Gala Romney  Chief Complaint  Patient presents with  . Gastroesophageal Reflux    f/u. Has burning in chest/throat  . Hemorrhoids    f/u. Doing ok    HPI:   Alyssa Aguilar is a 47 y.o. female who presents follow-up on GERD and hemorrhoids.  Patient was last seen in our office 04/12/2017 for the same as well as rectal bleeding and constipation. Colonoscopy up-to-date 05/21/2015 which found anal canal hemorrhoids as likely source of hematochezia, otherwise normal exam. She was given 1 month course of [place] suppositories at bedtime and recommended to finish those, call with any problems and consider Anusol cream or suppositories as needed.  At her last visit she was having a hemorrhoid flare with intermittent constipation, not currently on constipation medication and noted scant toilet tissue hematochezia with every bowel movement even when she is not having a flare.  GERD well controlled on PPI.  No other GI symptoms.  Recommended hemorrhoid cream, Colace to prevent constipation, follow-up for possible hemorrhoid banding.  More banding appointments completed 05/17/2017 06/17/2017.  Today she states she's doing ok overall. Hemorrhoid symptoms resolved after banding, no further symptoms or bleeding. GERD symptoms worse, has GERD every time she eats a full meal with burning in esophagus/throat, bitter taste in her mouth. She is on PPI. However, she takes Excedrin migraine as needed (none in a while). Takes ASA powders, about a box a day. Denies hematochezia. Occasional dark stools but not black/tarry. Denies fever, chills, losing weight unintentionally, abdominal pain. Has nausea about once a week postprandially. Denies chest pain, dyspnea, dizziness, lightheadedness, syncope, near syncope. Denies any other upper or lower GI symptoms.  Past Medical History:  Diagnosis Date  . GERD (gastroesophageal  reflux disease)   . Helicobacter pylori gastritis 2011   s/p treatment with Prevpac  . Hemorrhoids   . IBS (irritable bowel syndrome)   . Migraines   . S/P colonoscopy March 2011   internal hemorrhoids  . S/P endoscopy March 2011   antral erosions, chronic active gastritis, +H.pylori  . Spina bifida occulta     Past Surgical History:  Procedure Laterality Date  . CHOLECYSTECTOMY    . COLONOSCOPY N/A 05/21/2015   LPF:XTKW canal hemorrhoids otherwise normal  . DILATION AND CURETTAGE OF UTERUS    . egd/tcs  09/2009   Rourk-H.Pylori gastritis (s/p tx), benign sb bx, small hh, hemorrhoids, normal TI  . ENDOMETRIAL ABLATION    . HEMORRHOID BANDING    . TUBAL LIGATION      Current Outpatient Medications  Medication Sig Dispense Refill  . Aspirin-Acetaminophen-Caffeine (EXCEDRIN MIGRAINE PO) Take by mouth as needed.    . Aspirin-Acetaminophen-Caffeine (GOODY HEADACHE PO) Take by mouth as needed.    Marland Kitchen EPINEPHrine (EPIPEN IJ) Inject as directed as needed.    . hydrocortisone (ANUSOL-HC) 2.5 % rectal cream Place 1 application 2 (two) times daily rectally. (Patient taking differently: Place 1 application rectally as needed. ) 30 g 2  . linaclotide (LINZESS) 72 MCG capsule Take 72 mcg as needed by mouth.    Marland Kitchen omeprazole (PRILOSEC) 20 MG capsule TAKE 1 CAPSULE BY MOUTH EVERY DAY 30 capsule 5   No current facility-administered medications for this visit.     Allergies as of 09/20/2017 - Review Complete 09/20/2017  Allergen Reaction Noted  . Bee venom Anaphylaxis 11/06/2011  . Acetaminophen Swelling and Other (See Comments)   . Codeine Other (  See Comments)     Family History  Problem Relation Age of Onset  . Colon cancer Maternal Grandmother   . Cancer Maternal Grandmother        Breast Cancer  . Breast cancer Maternal Grandmother   . Hypertension Maternal Grandmother   . Diabetes Maternal Grandmother   . Heart disease Maternal Grandmother   . Asthma Mother   . Colon cancer  Maternal Uncle     Social History   Socioeconomic History  . Marital status: Single    Spouse name: None  . Number of children: 1  . Years of education: None  . Highest education level: None  Social Needs  . Financial resource strain: None  . Food insecurity - worry: None  . Food insecurity - inability: None  . Transportation needs - medical: None  . Transportation needs - non-medical: None  Occupational History  . Occupation: disabled    Comment: "spinal issues"  Tobacco Use  . Smoking status: Current Every Day Smoker    Packs/day: 2.50    Years: 23.00    Pack years: 57.50    Types: Cigarettes  . Smokeless tobacco: Never Used  . Tobacco comment: has cut down 1/2 ppd  Substance and Sexual Activity  . Alcohol use: No  . Drug use: No  . Sexual activity: Yes    Birth control/protection: Surgical  Other Topics Concern  . None  Social History Narrative   Lives w/ grandmother & daughter    Review of Systems: General: Negative for anorexia, weight loss, fever, chills, fatigue, weakness. ENT: Negative for hoarseness, difficulty swallowing. CV: Negative for chest pain, angina, palpitations, peripheral edema.  Respiratory: Negative for dyspnea at rest, cough, sputum, wheezing.  GI: See history of present illness. Endo: Negative for unusual weight change.  Heme: Negative for bruising or bleeding.  Physical Exam: BP 102/66   Pulse 72   Temp 97.7 F (36.5 C) (Oral)   Ht 5\' 4"  (1.626 m)   Wt 182 lb 9.6 oz (82.8 kg)   BMI 31.34 kg/m  General:   Alert and oriented. Pleasant and cooperative. Well-nourished and well-developed.  Eyes:  Without icterus, sclera clear and conjunctiva pink.  Ears:  Normal auditory acuity. Cardiovascular:  S1, S2 present without murmurs appreciated. Extremities without clubbing or edema. Respiratory:  Clear to auscultation bilaterally. No wheezes, rales, or rhonchi. No distress.  Gastrointestinal:  +BS, soft, non-tender and non-distended. No HSM  noted. No guarding or rebound. No masses appreciated.  Rectal:  Deferred  Musculoskalatal:  Symmetrical without gross deformities. Normal posture. Neurologic:  Alert and oriented x4;  grossly normal neurologically. Psych:  Alert and cooperative. Normal mood and affect. Heme/Lymph/Immune: No excessive bruising noted.    09/20/2017 10:37 AM   Disclaimer: This note was dictated with voice recognition software. Similar sounding words can inadvertently be transcribed and may not be corrected upon review.

## 2017-09-20 NOTE — Assessment & Plan Note (Signed)
Hemorrhoid symptoms have resolved as has rectal bleeding status post hemorrhoid banding x2.  Recommend she continue to monitor.  She still has Anusol cream available should she need it.  Follow-up in 3 months.

## 2017-09-20 NOTE — Progress Notes (Signed)
cc'ed to pcp °

## 2017-10-18 ENCOUNTER — Other Ambulatory Visit: Payer: Self-pay

## 2017-10-18 ENCOUNTER — Emergency Department (HOSPITAL_COMMUNITY)
Admission: EM | Admit: 2017-10-18 | Discharge: 2017-10-18 | Disposition: A | Discharge status: Home / Self Care | Payer: Medicaid Other | Attending: Emergency Medicine | Admitting: Emergency Medicine

## 2017-10-18 ENCOUNTER — Encounter (HOSPITAL_COMMUNITY): Payer: Self-pay | Admitting: Emergency Medicine

## 2017-10-18 DIAGNOSIS — R112 Nausea with vomiting, unspecified: Secondary | ICD-10-CM | POA: Insufficient documentation

## 2017-10-18 DIAGNOSIS — Z5321 Procedure and treatment not carried out due to patient leaving prior to being seen by health care provider: Secondary | ICD-10-CM | POA: Diagnosis not present

## 2017-10-18 LAB — CBC
HCT: 41.1 % (ref 36.0–46.0)
Hemoglobin: 12.7 g/dL (ref 12.0–15.0)
MCH: 25.6 pg — AB (ref 26.0–34.0)
MCHC: 30.9 g/dL (ref 30.0–36.0)
MCV: 82.9 fL (ref 78.0–100.0)
PLATELETS: 400 10*3/uL (ref 150–400)
RBC: 4.96 MIL/uL (ref 3.87–5.11)
RDW: 15.1 % (ref 11.5–15.5)
WBC: 4.9 10*3/uL (ref 4.0–10.5)

## 2017-10-18 LAB — URINALYSIS, ROUTINE W REFLEX MICROSCOPIC
Bilirubin Urine: NEGATIVE
Glucose, UA: NEGATIVE mg/dL
Ketones, ur: NEGATIVE mg/dL
Nitrite: NEGATIVE
PH: 5 (ref 5.0–8.0)
Protein, ur: NEGATIVE mg/dL
SPECIFIC GRAVITY, URINE: 1.031 — AB (ref 1.005–1.030)

## 2017-10-18 LAB — COMPREHENSIVE METABOLIC PANEL
ALK PHOS: 101 U/L (ref 38–126)
ALT: 13 U/L — AB (ref 14–54)
AST: 17 U/L (ref 15–41)
Albumin: 3.8 g/dL (ref 3.5–5.0)
Anion gap: 11 (ref 5–15)
BUN: 9 mg/dL (ref 6–20)
CHLORIDE: 108 mmol/L (ref 101–111)
CO2: 20 mmol/L — AB (ref 22–32)
CREATININE: 0.83 mg/dL (ref 0.44–1.00)
Calcium: 9.1 mg/dL (ref 8.9–10.3)
GFR calc Af Amer: >60 mL/min (ref >60)
Glucose, Bld: 89 mg/dL (ref 65–99)
Potassium: 2.9 mmol/L — ABNORMAL LOW (ref 3.5–5.1)
SODIUM: 139 mmol/L (ref 135–145)
Total Bilirubin: 0.4 mg/dL (ref 0.3–1.2)
Total Protein: 7.8 g/dL (ref 6.5–8.1)

## 2017-10-18 LAB — LIPASE, BLOOD: LIPASE: 24 U/L (ref 11–51)

## 2017-10-18 NOTE — ED Notes (Signed)
Pt left without being seen after triage

## 2017-10-18 NOTE — ED Triage Notes (Signed)
Pt c/o of n/v, headache, abdominal pain since Sunday.

## 2017-11-15 ENCOUNTER — Encounter: Payer: Self-pay | Admitting: Internal Medicine

## 2017-11-28 ENCOUNTER — Other Ambulatory Visit: Payer: Self-pay | Admitting: Obstetrics & Gynecology

## 2017-12-19 ENCOUNTER — Ambulatory Visit: Payer: Medicaid Other | Admitting: Nurse Practitioner

## 2017-12-20 ENCOUNTER — Ambulatory Visit: Payer: Medicaid Other | Admitting: Nurse Practitioner

## 2017-12-20 ENCOUNTER — Encounter: Payer: Self-pay | Admitting: Nurse Practitioner

## 2017-12-20 VITALS — BP 97/65 | HR 72 | Temp 98.1°F | Ht 64.0 in | Wt 171.8 lb

## 2017-12-20 DIAGNOSIS — K625 Hemorrhage of anus and rectum: Secondary | ICD-10-CM | POA: Diagnosis not present

## 2017-12-20 DIAGNOSIS — K219 Gastro-esophageal reflux disease without esophagitis: Secondary | ICD-10-CM | POA: Diagnosis not present

## 2017-12-20 DIAGNOSIS — K5904 Chronic idiopathic constipation: Secondary | ICD-10-CM | POA: Diagnosis not present

## 2017-12-20 MED ORDER — LINACLOTIDE 72 MCG PO CAPS: 72.0000 ug | ORAL_CAPSULE | ORAL | 2 refills | Status: DC | PRN

## 2017-12-20 NOTE — Patient Instructions (Signed)
1. Continue taking your current medications. 2. I refilled your Linzess medicine for constipation.  You should be able to get this from your pharmacy today. 3. Return for follow-up in 6 months. 4. Call us if you have any questions or concerns. 5. Call us if you have any problems before then.  At [facility] we value your feedback. You may receive a survey about your visit today. Please share your experience as we strive to create trusting relationships with our patients to provide genuine, compassionate, quality care.  Great to talk to you today!  I hope you have a wonderful, healthy summer!!

## 2017-12-20 NOTE — Assessment & Plan Note (Signed)
Symptoms doing well on PPI.  She stopped taking Excedrin Migraine and aspirin powders as recommended at her last visit.  Her symptoms have subsequently improved.  Recommend she continue to avoid these, continue PPI, follow-up in 6 months.

## 2017-12-20 NOTE — Progress Notes (Signed)
Referring Provider: Rosita Fire, MD Primary Care Physician:  Rosita Fire, MD Primary GI:  Dr. Gala Romney  Chief Complaint  Patient presents with  . Gastroesophageal Reflux    HPI:   Alyssa Aguilar is a 47 y.o. female who presents follow-up on rectal bleeding and GERD.  The patient was last seen in our office 09/20/2017 for the same as well as hemorrhoids.  Colonoscopy up-to-date 05/21/2015 which found anal canal hemorrhoids as likely source of hematochezia, otherwise normal.  Canasa suppositories at bedtime was recommended, consider Anusol in the future if needed.  She subsequently underwent banding of her hemorrhoids 05/17/2017 and 06/17/2017.  Her last visit she was doing okay overall.  Hemorrhoid symptoms resolved after banding, GERD symptoms worse and noted every time she is a full meal with burping and esophageal/throat bitter taste.  On PPI but takes Excedrin Migraine as needed.  Takes aspirin powders about a box a day.  No other GI symptoms.  Recommended increase Prilosec to twice a day, avoid all NSAIDs including aspirin powders.  Follow-up in 3 months.  Today she states she's doing well overall. No more rectal bleeding. GERD symptoms doing ok. Still on PPI, was just refilled yesterday. Stopped taking Excedrin and ASA powders. Denies abdominal pain, N/V, hematochezia, melena, fever, chills, unintentional weight loss. Having some constipation, ran out of Linzess. Is requesting a refill. Denies chest pain, dyspnea, dizziness, lightheadedness, syncope, near syncope. Denies any other upper or lower GI symptoms.  Past Medical History:  Diagnosis Date  . GERD (gastroesophageal reflux disease)   . Helicobacter pylori gastritis 2011   s/p treatment with Prevpac  . Hemorrhoids   . IBS (irritable bowel syndrome)   . Migraines   . S/P colonoscopy March 2011   internal hemorrhoids  . S/P endoscopy March 2011   antral erosions, chronic active gastritis, +H.pylori  . Spina bifida occulta      Past Surgical History:  Procedure Laterality Date  . CHOLECYSTECTOMY    . COLONOSCOPY N/A 05/21/2015   WER:XVQM canal hemorrhoids otherwise normal  . DILATION AND CURETTAGE OF UTERUS    . egd/tcs  09/2009   Rourk-H.Pylori gastritis (s/p tx), benign sb bx, small hh, hemorrhoids, normal TI  . ENDOMETRIAL ABLATION    . HEMORRHOID BANDING    . TUBAL LIGATION      Current Outpatient Medications  Medication Sig Dispense Refill  . hydrocortisone (ANUSOL-HC) 2.5 % rectal cream Place 1 application 2 (two) times daily rectally. (Patient taking differently: Place 1 application rectally as needed. ) 30 g 2  . linaclotide (LINZESS) 72 MCG capsule Take 72 mcg as needed by mouth.    Marland Kitchen omeprazole (PRILOSEC) 20 MG capsule Take 1 capsule (20 mg total) by mouth 2 (two) times daily before a meal. 60 capsule 3  . EPINEPHrine (EPIPEN IJ) Inject as directed as needed.     No current facility-administered medications for this visit.     Allergies as of 12/20/2017 - Review Complete 12/20/2017  Allergen Reaction Noted  . Bee venom Anaphylaxis 11/06/2011  . Acetaminophen Swelling and Other (See Comments)   . Codeine Other (See Comments)     Family History  Problem Relation Age of Onset  . Colon cancer Maternal Grandmother   . Cancer Maternal Grandmother        Breast Cancer  . Breast cancer Maternal Grandmother   . Hypertension Maternal Grandmother   . Diabetes Maternal Grandmother   . Heart disease Maternal Grandmother   . Asthma Mother   .  Colon cancer Maternal Uncle     Social History   Socioeconomic History  . Marital status: Single    Spouse name: Not on file  . Number of children: 1  . Years of education: Not on file  . Highest education level: Not on file  Occupational History  . Occupation: disabled    Comment: "spinal issues"  Social Needs  . Financial resource strain: Not on file  . Food insecurity:    Worry: Not on file    Inability: Not on file  . Transportation  needs:    Medical: Not on file    Non-medical: Not on file  Tobacco Use  . Smoking status: Current Every Day Smoker    Packs/day: 2.50    Years: 23.00    Pack years: 57.50    Types: Cigarettes  . Smokeless tobacco: Never Used  . Tobacco comment: one pack a day  Substance and Sexual Activity  . Alcohol use: No  . Drug use: No  . Sexual activity: Yes    Birth control/protection: Surgical  Lifestyle  . Physical activity:    Days per week: Not on file    Minutes per session: Not on file  . Stress: Not on file  Relationships  . Social connections:    Talks on phone: Not on file    Gets together: Not on file    Attends religious service: Not on file    Active member of club or organization: Not on file    Attends meetings of clubs or organizations: Not on file    Relationship status: Not on file  Other Topics Concern  . Not on file  Social History Narrative   Lives w/ grandmother & daughter    Review of Systems: General: Negative for anorexia, weight loss, fever, chills, fatigue, weakness. ENT: Negative for hoarseness, difficulty swallowing , nasal congestion. CV: Negative for chest pain, angina, palpitations, dyspnea on exertion, peripheral edema.  Respiratory: Negative for dyspnea at rest, dyspnea on exertion, cough, sputum, wheezing.  GI: See history of present illness. Endo: Negative for unusual weight change.  Heme: Negative for bruising or bleeding.   Physical Exam: BP 97/65   Pulse 72   Temp 98.1 F (36.7 C) (Oral)   Ht 5\' 4"  (1.626 m)   Wt 171 lb 12.8 oz (77.9 kg)   BMI 29.49 kg/m  General:   Alert and oriented. Pleasant and cooperative. Well-nourished and well-developed.  Eyes:  Without icterus, sclera clear and conjunctiva pink.  Ears:  Normal auditory acuity. Cardiovascular:  S1, S2 present without murmurs appreciated. Extremities without clubbing or edema. Respiratory:  Clear to auscultation bilaterally. No wheezes, rales, or rhonchi. No distress.   Gastrointestinal:  +BS, soft, non-tender and non-distended. No HSM noted. No guarding or rebound. No masses appreciated.  Rectal:  Deferred  Musculoskalatal:  Symmetrical without gross deformities. Neurologic:  Alert and oriented x4;  grossly normal neurologically. Psych:  Alert and cooperative. Normal mood and affect. Heme/Lymph/Immune: No excessive bruising noted.    12/20/2017 4:01 PM   Disclaimer: This note was dictated with voice recognition software. Similar sounding words can inadvertently be transcribed and may not be corrected upon review.

## 2017-12-20 NOTE — Assessment & Plan Note (Signed)
Intermittent constipation which is worsened since she ran out of her Linzess.  Linzess previously worked well for her.  I have refilled this for her today.  Recommend she continue to take it as previously prescribed (6 months a day on an empty stomach).  Follow-up in 6 months.

## 2017-12-20 NOTE — Assessment & Plan Note (Signed)
No further symptoms.  Continue to monitor, follow-up in 6 months.

## 2017-12-21 NOTE — Progress Notes (Signed)
CC'D TO PCP °

## 2017-12-27 ENCOUNTER — Encounter: Payer: Self-pay | Admitting: Obstetrics & Gynecology

## 2017-12-27 ENCOUNTER — Ambulatory Visit: Payer: Medicaid Other | Admitting: Obstetrics & Gynecology

## 2017-12-27 ENCOUNTER — Other Ambulatory Visit (HOSPITAL_COMMUNITY): Payer: Self-pay | Admitting: Internal Medicine

## 2017-12-27 ENCOUNTER — Other Ambulatory Visit (HOSPITAL_COMMUNITY)
Admission: RE | Admit: 2017-12-27 | Discharge: 2017-12-27 | Disposition: A | Discharge status: Home / Self Care | Payer: Medicaid Other | Source: Ambulatory Visit | Attending: Obstetrics & Gynecology | Admitting: Obstetrics & Gynecology

## 2017-12-27 VITALS — BP 110/80 | HR 96 | Ht 64.0 in | Wt 170.0 lb

## 2017-12-27 DIAGNOSIS — Z1231 Encounter for screening mammogram for malignant neoplasm of breast: Secondary | ICD-10-CM

## 2017-12-27 DIAGNOSIS — Z01419 Encounter for gynecological examination (general) (routine) without abnormal findings: Secondary | ICD-10-CM

## 2017-12-27 DIAGNOSIS — Z0000 Encounter for general adult medical examination without abnormal findings: Secondary | ICD-10-CM | POA: Diagnosis not present

## 2017-12-27 NOTE — Progress Notes (Signed)
Subjective:     Alyssa Aguilar is a 47 y.o. female here for a routine exam.  No LMP recorded. Patient has had an ablation. G1P0100 Birth Control Method:  BTL Menstrual Calendar(currently): amenorrehic  Current complaints: none.   Current acute medical issues:  none   Recent Gynecologic History No LMP recorded. Patient has had an ablation. Last Pap: 2018,  normal Last mammogram: 2018,  normal  Past Medical History:  Diagnosis Date  . GERD (gastroesophageal reflux disease)   . Helicobacter pylori gastritis 2011   s/p treatment with Prevpac  . Hemorrhoids   . IBS (irritable bowel syndrome)   . Migraines   . S/P colonoscopy March 2011   internal hemorrhoids  . S/P endoscopy March 2011   antral erosions, chronic active gastritis, +H.pylori  . Spina bifida occulta     Past Surgical History:  Procedure Laterality Date  . CHOLECYSTECTOMY    . COLONOSCOPY N/A 05/21/2015   PXT:GGYI canal hemorrhoids otherwise normal  . DILATION AND CURETTAGE OF UTERUS    . egd/tcs  09/2009   Rourk-H.Pylori gastritis (s/p tx), benign sb bx, small hh, hemorrhoids, normal TI  . ENDOMETRIAL ABLATION    . HEMORRHOID BANDING    . TUBAL LIGATION      OB History    Gravida  1   Para  1   Term      Preterm  1   AB      Living        SAB      TAB      Ectopic      Multiple      Live Births              Social History   Socioeconomic History  . Marital status: Single    Spouse name: Not on file  . Number of children: 1  . Years of education: Not on file  . Highest education level: Not on file  Occupational History  . Occupation: disabled    Comment: "spinal issues"  Social Needs  . Financial resource strain: Not on file  . Food insecurity:    Worry: Not on file    Inability: Not on file  . Transportation needs:    Medical: Not on file    Non-medical: Not on file  Tobacco Use  . Smoking status: Current Every Day Smoker    Packs/day: 2.50    Years: 23.00    Pack  years: 57.50    Types: Cigarettes  . Smokeless tobacco: Never Used  . Tobacco comment: one pack a day  Substance and Sexual Activity  . Alcohol use: No  . Drug use: No  . Sexual activity: Yes    Birth control/protection: Surgical  Lifestyle  . Physical activity:    Days per week: Not on file    Minutes per session: Not on file  . Stress: Not on file  Relationships  . Social connections:    Talks on phone: Not on file    Gets together: Not on file    Attends religious service: Not on file    Active member of club or organization: Not on file    Attends meetings of clubs or organizations: Not on file    Relationship status: Not on file  Other Topics Concern  . Not on file  Social History Narrative   Lives w/ grandmother & daughter    Family History  Problem Relation Age of Onset  . Colon cancer Maternal  Grandmother   . Cancer Maternal Grandmother        Breast Cancer  . Breast cancer Maternal Grandmother   . Hypertension Maternal Grandmother   . Diabetes Maternal Grandmother   . Heart disease Maternal Grandmother   . Asthma Mother   . Colon cancer Maternal Uncle      Current Outpatient Medications:  .  hydrocortisone (ANUSOL-HC) 2.5 % rectal cream, Place 1 application 2 (two) times daily rectally. (Patient taking differently: Place 1 application rectally as needed. ), Disp: 30 g, Rfl: 2 .  linaclotide (LINZESS) 72 MCG capsule, Take 1 capsule (72 mcg total) by mouth as needed., Disp: 90 capsule, Rfl: 2 .  omeprazole (PRILOSEC) 20 MG capsule, Take 1 capsule (20 mg total) by mouth 2 (two) times daily before a meal., Disp: 60 capsule, Rfl: 3 .  EPINEPHrine (EPIPEN IJ), Inject as directed as needed., Disp: , Rfl:   Review of Systems  Review of Systems  Constitutional: Negative for fever, chills, weight loss, malaise/fatigue and diaphoresis.  HENT: Negative for hearing loss, ear pain, nosebleeds, congestion, sore throat, neck pain, tinnitus and ear discharge.   Eyes:  Negative for blurred vision, double vision, photophobia, pain, discharge and redness.  Respiratory: Negative for cough, hemoptysis, sputum production, shortness of breath, wheezing and stridor.   Cardiovascular: Negative for chest pain, palpitations, orthopnea, claudication, leg swelling and PND.  Gastrointestinal: negative for abdominal pain. Negative for heartburn, nausea, vomiting, diarrhea, constipation, blood in stool and melena.  Genitourinary: Negative for dysuria, urgency, frequency, hematuria and flank pain.  Musculoskeletal: Negative for myalgias, back pain, joint pain and falls.  Skin: Negative for itching and rash.  Neurological: Negative for dizziness, tingling, tremors, sensory change, speech change, focal weakness, seizures, loss of consciousness, weakness and headaches.  Endo/Heme/Allergies: Negative for environmental allergies and polydipsia. Does not bruise/bleed easily.  Psychiatric/Behavioral: Negative for depression, suicidal ideas, hallucinations, memory loss and substance abuse. The patient is not nervous/anxious and does not have insomnia.        Objective:  Blood pressure 110/80, pulse 96, height 5\' 4"  (1.626 m), weight 170 lb (77.1 kg).   Physical Exam  Vitals reviewed. Constitutional: She is oriented to person, place, and time. She appears well-developed and well-nourished.  HENT:  Head: Normocephalic and atraumatic.        Right Ear: External ear normal.  Left Ear: External ear normal.  Nose: Nose normal.  Mouth/Throat: Oropharynx is clear and moist.  Eyes: Conjunctivae and EOM are normal. Pupils are equal, round, and reactive to light. Right eye exhibits no discharge. Left eye exhibits no discharge. No scleral icterus.  Neck: Normal range of motion. Neck supple. No tracheal deviation present. No thyromegaly present.  Cardiovascular: Normal rate, regular rhythm, normal heart sounds and intact distal pulses.  Exam reveals no gallop and no friction rub.   No  murmur heard. Respiratory: Effort normal and breath sounds normal. No respiratory distress. She has no wheezes. She has no rales. She exhibits no tenderness.  GI: Soft. Bowel sounds are normal. She exhibits no distension and no mass. There is no tenderness. There is no rebound and no guarding.  Genitourinary:  Breasts no masses skin changes or nipple changes bilaterally      Vulva is normal without lesions Vagina is pink moist without discharge Cervix normal in appearance and pap is done Uterus is normal size shape and contour Adnexa is negative with normal sized ovaries   Musculoskeletal: Normal range of motion. She exhibits no edema and no  tenderness.  Neurological: She is alert and oriented to person, place, and time. She has normal reflexes. She displays normal reflexes. No cranial nerve deficit. She exhibits normal muscle tone. Coordination normal.  Skin: Skin is warm and dry. No rash noted. No erythema. No pallor.  Psychiatric: She has a normal mood and affect. Her behavior is normal. Judgment and thought content normal.       Medications Ordered at today's visit: No orders of the defined types were placed in this encounter.   Other orders placed at today's visit: No orders of the defined types were placed in this encounter.     Assessment:    Healthy female exam.    Plan:    Contraception: tubal ligation. Mammogram ordered. Follow up in: 1 years.     Return in about 1 year (around 12/28/2018) for yearly, with Dr Elonda Husky.

## 2017-12-29 LAB — CYTOLOGY - PAP
DIAGNOSIS: NEGATIVE
HPV: NOT DETECTED

## 2018-01-04 ENCOUNTER — Ambulatory Visit (HOSPITAL_COMMUNITY)
Admission: RE | Admit: 2018-01-04 | Discharge: 2018-01-04 | Disposition: A | Discharge status: Home / Self Care | Payer: Medicaid Other | Source: Ambulatory Visit | Attending: Internal Medicine | Admitting: Internal Medicine

## 2018-01-04 ENCOUNTER — Encounter (HOSPITAL_COMMUNITY): Payer: Self-pay

## 2018-01-04 DIAGNOSIS — Z1231 Encounter for screening mammogram for malignant neoplasm of breast: Secondary | ICD-10-CM | POA: Insufficient documentation

## 2018-01-09 ENCOUNTER — Other Ambulatory Visit (HOSPITAL_COMMUNITY): Payer: Self-pay | Admitting: Internal Medicine

## 2018-01-09 DIAGNOSIS — R928 Other abnormal and inconclusive findings on diagnostic imaging of breast: Secondary | ICD-10-CM

## 2018-01-17 ENCOUNTER — Ambulatory Visit (HOSPITAL_COMMUNITY)
Admission: RE | Admit: 2018-01-17 | Discharge: 2018-01-17 | Disposition: A | Discharge status: Home / Self Care | Payer: Medicaid Other | Source: Ambulatory Visit | Attending: Internal Medicine | Admitting: Internal Medicine

## 2018-01-17 DIAGNOSIS — R928 Other abnormal and inconclusive findings on diagnostic imaging of breast: Secondary | ICD-10-CM | POA: Insufficient documentation

## 2018-05-01 ENCOUNTER — Other Ambulatory Visit: Payer: Self-pay | Admitting: Nurse Practitioner

## 2018-05-01 DIAGNOSIS — K625 Hemorrhage of anus and rectum: Secondary | ICD-10-CM

## 2018-05-01 DIAGNOSIS — K649 Unspecified hemorrhoids: Secondary | ICD-10-CM

## 2018-05-01 DIAGNOSIS — K219 Gastro-esophageal reflux disease without esophagitis: Secondary | ICD-10-CM

## 2018-06-22 ENCOUNTER — Ambulatory Visit: Payer: Medicaid Other | Admitting: Nurse Practitioner

## 2018-07-03 ENCOUNTER — Ambulatory Visit (INDEPENDENT_AMBULATORY_CARE_PROVIDER_SITE_OTHER): Payer: Medicaid Other | Admitting: Nurse Practitioner

## 2018-07-03 ENCOUNTER — Encounter: Payer: Self-pay | Admitting: Nurse Practitioner

## 2018-07-03 VITALS — BP 118/76 | HR 95 | Temp 96.7°F | Ht 64.0 in | Wt 158.2 lb

## 2018-07-03 DIAGNOSIS — K589 Irritable bowel syndrome without diarrhea: Secondary | ICD-10-CM

## 2018-07-03 DIAGNOSIS — R1030 Lower abdominal pain, unspecified: Secondary | ICD-10-CM

## 2018-07-03 DIAGNOSIS — K219 Gastro-esophageal reflux disease without esophagitis: Secondary | ICD-10-CM | POA: Diagnosis not present

## 2018-07-03 NOTE — Progress Notes (Signed)
Referring Provider: Rosita Fire, MD Primary Care Physician:  Rosita Fire, MD Primary GI:  Dr. Gala Romney  Chief Complaint  Patient presents with  . Constipation    HPI:   Alyssa Aguilar is a 47 y.o. female who presents for follow-up on GERD and rectal bleeding.  The patient was last seen in our office 12/20/2017 for the same as well as chronic idiopathic constipation.  Colonoscopy up-to-date 05/21/2015 which found anal canal hemorrhoids as likely source of hematochezia.  Canasa suppositories at bedtime was recommended as well as Anusol in the future if needed.  Underwent banding of hemorrhoids on 05/17/2017 and 06/17/2017 with noted hemorrhoid resolution.  She was previously on Excedrin Migraine as needed and aspirin powders, about a box a day.  At her last visit she was doing okay overall, no more rectal bleeding.  GERD symptoms doing okay, still on PPI.  She noted that she stopped taking Excedrin and aspirin powders.  No other GI symptoms.  Is requesting refill of Linzess at her last visit.  Recommended continue taking current medications, Linzess was refilled, follow-up in 6 months.  Today she states she's doing ok overall. Constipation is so-so. Only taking Linzess "once in a blue moon." Sometimes she's taken it when she has to go somewhere and has to go when it's time to leave. Overall it helps when she does use. Occasional/rare abdominal pain, typically if she eats too much; this improves with a bowel movement. Has intermittent nausea, rare vomiting (most recently after a funeral when she ate too much.) Denies hematochezia, melena, fever, chills, unintentional weight loss. GERD doing well on PPI. Denies chest pain, dyspnea, dizziness, lightheadedness, syncope, near syncope. Denies any other upper or lower GI symptoms.  Past Medical History:  Diagnosis Date  . GERD (gastroesophageal reflux disease)   . Helicobacter pylori gastritis 2011   s/p treatment with Prevpac  . Hemorrhoids   . IBS  (irritable bowel syndrome)   . Migraines   . S/P colonoscopy March 2011   internal hemorrhoids  . S/P endoscopy March 2011   antral erosions, chronic active gastritis, +H.pylori  . Spina bifida occulta     Past Surgical History:  Procedure Laterality Date  . CHOLECYSTECTOMY    . COLONOSCOPY N/A 05/21/2015   VZD:GLOV canal hemorrhoids otherwise normal  . DILATION AND CURETTAGE OF UTERUS    . egd/tcs  09/2009   Rourk-H.Pylori gastritis (s/p tx), benign sb bx, small hh, hemorrhoids, normal TI  . ENDOMETRIAL ABLATION    . HEMORRHOID BANDING    . TUBAL LIGATION      Current Outpatient Medications  Medication Sig Dispense Refill  . EPINEPHrine (EPIPEN IJ) Inject as directed as needed.    . hydrocortisone (ANUSOL-HC) 2.5 % rectal cream Place 1 application 2 (two) times daily rectally. (Patient taking differently: Place 1 application rectally as needed. ) 30 g 2  . linaclotide (LINZESS) 72 MCG capsule Take 1 capsule (72 mcg total) by mouth as needed. 90 capsule 2  . omeprazole (PRILOSEC) 20 MG capsule TAKE 1 CAPSULE BY MOUTH TWICE DAILY BEFORE A MEAL 60 capsule 3   No current facility-administered medications for this visit.     Allergies as of 07/03/2018 - Review Complete 07/03/2018  Allergen Reaction Noted  . Bee venom Anaphylaxis 11/06/2011  . Acetaminophen Swelling and Other (See Comments)   . Codeine Other (See Comments)     Family History  Problem Relation Age of Onset  . Colon cancer Maternal Grandmother   .  Cancer Maternal Grandmother        Breast Cancer  . Breast cancer Maternal Grandmother   . Hypertension Maternal Grandmother   . Diabetes Maternal Grandmother   . Heart disease Maternal Grandmother   . Asthma Mother   . Colon cancer Maternal Uncle     Social History   Socioeconomic History  . Marital status: Single    Spouse name: Not on file  . Number of children: 1  . Years of education: Not on file  . Highest education level: Not on file  Occupational  History  . Occupation: disabled    Comment: "spinal issues"  Social Needs  . Financial resource strain: Not on file  . Food insecurity:    Worry: Not on file    Inability: Not on file  . Transportation needs:    Medical: Not on file    Non-medical: Not on file  Tobacco Use  . Smoking status: Current Every Day Smoker    Packs/day: 2.50    Years: 23.00    Pack years: 57.50    Types: Cigarettes  . Smokeless tobacco: Never Used  . Tobacco comment: one pack a day  Substance and Sexual Activity  . Alcohol use: No  . Drug use: No  . Sexual activity: Yes    Birth control/protection: Surgical  Lifestyle  . Physical activity:    Days per week: Not on file    Minutes per session: Not on file  . Stress: Not on file  Relationships  . Social connections:    Talks on phone: Not on file    Gets together: Not on file    Attends religious service: Not on file    Active member of club or organization: Not on file    Attends meetings of clubs or organizations: Not on file    Relationship status: Not on file  Other Topics Concern  . Not on file  Social History Narrative   Lives w/ grandmother & daughter    Review of Systems: General: Negative for anorexia, weight loss, fever, chills, fatigue, weakness. ENT: Negative for hoarseness, difficulty swallowing. CV: Negative for chest pain, angina, palpitations, peripheral edema.  Respiratory: Negative for dyspnea at rest, cough, sputum, wheezing.  GI: See history of present illness. Endo: Negative for unusual weight change.  Heme: Negative for bruising or bleeding.  Physical Exam: BP 118/76   Pulse 95   Temp (!) 96.7 F (35.9 C) (Oral)   Ht 5\' 4"  (1.626 m)   Wt 158 lb 3.2 oz (71.8 kg)   BMI 27.15 kg/m  General:   Alert and oriented. Pleasant and cooperative. Well-nourished and well-developed.  Eyes:  Without icterus, sclera clear and conjunctiva pink.  Ears:  Normal auditory acuity. Cardiovascular:  S1, S2 present without murmurs  appreciated. Extremities without clubbing or edema. Respiratory:  Clear to auscultation bilaterally. No wheezes, rales, or rhonchi. No distress.  Gastrointestinal:  +BS, soft, non-tender and non-distended. No HSM noted. No guarding or rebound. No masses appreciated.  Rectal:  Deferred  Musculoskalatal:  Symmetrical without gross deformities. Neurologic:  Alert and oriented x4;  grossly normal neurologically. Psych:  Alert and cooperative. Normal mood and affect. Heme/Lymph/Immune: No excessive bruising noted.    07/03/2018 9:21 AM   Disclaimer: This note was dictated with voice recognition software. Similar sounding words can inadvertently be transcribed and may not be corrected upon review.

## 2018-07-03 NOTE — Progress Notes (Signed)
cc'ed to pcp °

## 2018-07-03 NOTE — Assessment & Plan Note (Signed)
Currently well managed on PPI.  Rare to no breakthrough symptoms.  Recommend she continue her PPI, follow-up in 1 year.

## 2018-07-03 NOTE — Patient Instructions (Signed)
1. As we discussed, increase your Linzess.  Take it every other day.  If this is not enough you can take it every day.  If it is too much you can take it once every 3 days. 2. Continue your other medications. 3. Follow-up in 1 year. 4. Call us if you have any questions or concerns.  At [facility] we value your feedback. You may receive a survey about your visit today. Please share your experience as we strive to create trusting relationships with our patients to provide genuine, compassionate, quality care.  We appreciate your understanding and patience as we review any laboratory studies, imaging, and other diagnostic tests that are ordered as we care for you. Our office policy is 5 business days for review of these results, and any emergent or urgent results are addressed in a timely manner for your best interest. If you do not hear from our office in 1 week, please contact us.   We also encourage the use of MyChart, which contains your medical information for your review as well. If you are not enrolled in this feature, an access code is on this after visit summary for your convenience. Thank you for allowing [place] to be involved in your care.  It was great to see you today!  I hope you have a Merry Christmas!!

## 2018-07-03 NOTE — Assessment & Plan Note (Signed)
Admits abdominal pain.  This is typically if she eats too much.  She does have some improvement after bowel movement.  Likely multifactorial abdominal pain including overeating, GERD, constipation/IBS.  Recommend she take Linzess more regularly.  Try to not eat as much in the setting.  Continue other medications and follow-up in 1 year.

## 2018-07-03 NOTE — Assessment & Plan Note (Signed)
IBS constipation type.  She is on Linzess.  She states she only takes this "once in a blue moon."  Subsequently she still has constipation.  I recommended she increase this to about every other day.  She can decrease to every third day if it is too much or once a day if it is not enough.  She does have abdominal pain, as per below, although this is generally resolved with a bowel movement.  Likely some element of IBS explaining her abdominal pain.  Continue other medications and follow-up in 1 year.

## 2018-12-28 ENCOUNTER — Other Ambulatory Visit: Payer: Self-pay | Admitting: Gastroenterology

## 2018-12-28 DIAGNOSIS — K649 Unspecified hemorrhoids: Secondary | ICD-10-CM

## 2018-12-28 DIAGNOSIS — K219 Gastro-esophageal reflux disease without esophagitis: Secondary | ICD-10-CM

## 2018-12-28 DIAGNOSIS — K625 Hemorrhage of anus and rectum: Secondary | ICD-10-CM

## 2018-12-29 ENCOUNTER — Other Ambulatory Visit: Payer: Self-pay | Admitting: Obstetrics & Gynecology

## 2019-02-08 ENCOUNTER — Other Ambulatory Visit: Payer: Medicaid Other | Admitting: Obstetrics & Gynecology

## 2019-02-14 ENCOUNTER — Telehealth: Payer: Self-pay | Admitting: Adult Health

## 2019-02-14 NOTE — Telephone Encounter (Signed)

## 2019-02-15 ENCOUNTER — Other Ambulatory Visit: Payer: Medicaid Other | Admitting: Adult Health

## 2019-03-07 ENCOUNTER — Other Ambulatory Visit (HOSPITAL_COMMUNITY): Payer: Self-pay | Admitting: Internal Medicine

## 2019-03-07 DIAGNOSIS — Z1231 Encounter for screening mammogram for malignant neoplasm of breast: Secondary | ICD-10-CM

## 2019-03-29 ENCOUNTER — Ambulatory Visit: Payer: Medicaid Other | Admitting: Gastroenterology

## 2019-03-29 ENCOUNTER — Encounter: Payer: Self-pay | Admitting: Internal Medicine

## 2019-05-11 ENCOUNTER — Other Ambulatory Visit: Payer: Self-pay

## 2019-05-11 ENCOUNTER — Encounter: Payer: Self-pay | Admitting: Gastroenterology

## 2019-05-11 ENCOUNTER — Ambulatory Visit (INDEPENDENT_AMBULATORY_CARE_PROVIDER_SITE_OTHER): Payer: Medicaid Other | Admitting: Gastroenterology

## 2019-05-11 VITALS — BP 109/68 | HR 69 | Temp 96.9°F | Ht 64.0 in | Wt 178.0 lb

## 2019-05-11 DIAGNOSIS — K649 Unspecified hemorrhoids: Secondary | ICD-10-CM | POA: Diagnosis not present

## 2019-05-11 DIAGNOSIS — K5900 Constipation, unspecified: Secondary | ICD-10-CM

## 2019-05-11 DIAGNOSIS — K219 Gastro-esophageal reflux disease without esophagitis: Secondary | ICD-10-CM | POA: Diagnosis not present

## 2019-05-11 MED ORDER — LINACLOTIDE 145 MCG PO CAPS: 145.0000 ug | ORAL_CAPSULE | Freq: Every day | ORAL | 5 refills | Status: DC

## 2019-05-11 MED ORDER — HYDROCORTISONE (PERIANAL) 2.5 % EX CREA: 1.0000 "application " | TOPICAL_CREAM | Freq: Two times a day (BID) | CUTANEOUS | 1 refills | Status: DC

## 2019-05-11 NOTE — Patient Instructions (Addendum)
Decrease omeprazole (Prilosec) to once each morning, 30 minutes before breakfast.   Avoid straining. Limit toilet toilet to 2-3 minutes at a time.   Start Linzess higher dosage 30 minutes before breakfast each morning. Let us know how this works for you.   Start using Anusol twice a day per rectum for about 5-7 days, and then give it a break.   We will arrange a visit for hemorrhoid banding in the near future!  It was a pleasure to see you today. I want to create trusting relationships with patients to provide genuine, compassionate, and quality care. I value your feedback. If you receive a survey regarding your visit,  I greatly appreciate you taking time to fill this out.   Annitta Needs, PhD, ANP-BC [facility]

## 2019-05-11 NOTE — Progress Notes (Signed)
Referring Provider: Rosita Fire, MD Primary Care Physician:  Rosita Fire, MD Primary GI: Dr. Gala Romney   Chief Complaint  Patient presents with  . Hemorrhoids    occ bleeding  . Constipation    HPI:   Alyssa Aguilar is a 48 y.o. female presenting today with a history of constipation, hemorrhoids, banding in 2018 X 2 sessions.  Colonoscopy up-to-date 05/21/2015 which found anal canal hemorrhoids as likely source of hematochezia.   Straining with BMs. Linzess  72 mcg daily. Low-volume hematochezia with straining.  Abdominal discomfort at times. Itching and burning at times. Improvement after hemorrhoid banding in the past.   Past Medical History:  Diagnosis Date  . GERD (gastroesophageal reflux disease)   . Helicobacter pylori gastritis 2011   s/p treatment with Prevpac  . Hemorrhoids   . IBS (irritable bowel syndrome)   . Migraines   . S/P colonoscopy March 2011   internal hemorrhoids  . S/P endoscopy March 2011   antral erosions, chronic active gastritis, +H.pylori  . Spina bifida occulta     Past Surgical History:  Procedure Laterality Date  . CHOLECYSTECTOMY    . COLONOSCOPY N/A 05/21/2015   [id number] canal hemorrhoids otherwise normal  . DILATION AND CURETTAGE OF UTERUS    . egd/tcs  09/2009   Rourk-H.Pylori gastritis (s/p tx), benign sb bx, small hh, hemorrhoids, normal TI  . ENDOMETRIAL ABLATION    . HEMORRHOID BANDING    . TUBAL LIGATION      Current Outpatient Medications  Medication Sig Dispense Refill  . EPINEPHrine (EPIPEN IJ) Inject as directed as needed.    . linaclotide (LINZESS) 72 MCG capsule Take 1 capsule (72 mcg total) by mouth as needed. 90 capsule 2  . omeprazole (PRILOSEC) 20 MG capsule TAKE ONE CAPSULE BY MOUTH TWICE DAILY BEFORE A MEAL 60 capsule 3  . hydrocortisone (ANUSOL-HC) 2.5 % rectal cream Place 1 application rectally 2 (two) times daily. 30 g 1  . linaclotide (LINZESS) 145 MCG CAPS capsule Take 1 capsule (145 mcg total) by mouth  daily before breakfast. 30 capsule 5   No current facility-administered medications for this visit.     Allergies as of 05/11/2019 - Review Complete 05/11/2019  Allergen Reaction Noted  . Bee venom Anaphylaxis 11/06/2011  . Acetaminophen Swelling and Other (See Comments)   . Codeine Other (See Comments)     Family History  Problem Relation Age of Onset  . Colon cancer Maternal Grandmother   . Cancer Maternal Grandmother        Breast Cancer  . Breast cancer Maternal Grandmother   . Hypertension Maternal Grandmother   . Diabetes Maternal Grandmother   . Heart disease Maternal Grandmother   . Asthma Mother   . Colon cancer Maternal Uncle     Social History   Socioeconomic History  . Marital status: Single    Spouse name: Not on file  . Number of children: 1  . Years of education: Not on file  . Highest education level: Not on file  Occupational History  . Occupation: disabled    Comment: "spinal issues"  Social Needs  . Financial resource strain: Not on file  . Food insecurity    Worry: Not on file    Inability: Not on file  . Transportation needs    Medical: Not on file    Non-medical: Not on file  Tobacco Use  . Smoking status: Current Every Day Smoker    Packs/day: 2.50    Years:  23.00    Pack years: 57.50    Types: Cigarettes  . Smokeless tobacco: Never Used  . Tobacco comment: one pack a day  Substance and Sexual Activity  . Alcohol use: No  . Drug use: No  . Sexual activity: Yes    Birth control/protection: Surgical  Lifestyle  . Physical activity    Days per week: Not on file    Minutes per session: Not on file  . Stress: Not on file  Relationships  . Social Herbalist on phone: Not on file    Gets together: Not on file    Attends religious service: Not on file    Active member of club or organization: Not on file    Attends meetings of clubs or organizations: Not on file    Relationship status: Not on file  Other Topics Concern  .  Not on file  Social History Narrative   Lives w/ grandmother & daughter    Review of Systems: Gen: Denies fever, chills, anorexia. Denies fatigue, weakness, weight loss.  CV: Denies chest pain, palpitations, syncope, peripheral edema, and claudication. Resp: Denies dyspnea at rest, cough, wheezing, coughing up blood, and pleurisy. GI: see HPI Derm: Denies rash, itching, dry skin Psych: Denies depression, anxiety, memory loss, confusion. No homicidal or suicidal ideation.  Heme: see HPI  Physical Exam: BP 109/68   Pulse 69   Temp (!) 96.9 F (36.1 C) (Temporal)   Ht 5\' 4"  (1.626 m)   Wt 178 lb (80.7 kg)   BMI 30.55 kg/m  General:   Alert and oriented. No distress noted. Pleasant and cooperative.  Head:  Normocephalic and atraumatic. Eyes:  Conjuctiva clear without scleral icterus. Abdomen:  +BS, soft, non-tender and non-distended. No rebound or guarding. No HSM or masses noted. Rectal: Small non-thrombosed hemorrhoid tags, internal exam without mass. Small stool in rectal vault, brown. No blood. No anoscopy performed.  Msk:  Symmetrical without gross deformities. Normal posture. Extremities:  Without edema. Neurologic:  Alert and  oriented x4 Psych:  Alert and cooperative. Normal mood and affect.

## 2019-05-11 NOTE — Assessment & Plan Note (Signed)
Linzess 72 mcg with persistent hard stool. Increase to 145 mcg daily. Samples provided. Prescription sent to pharmacy as well if needed. Discussed avoidance of straining, limiting toilet time. Return in 6 weeks for likely hemorrhoid banding. Will maximize bowel regimen first.

## 2019-05-11 NOTE — Assessment & Plan Note (Signed)
Banding several years ago with good results. Colonoscopy in 2016. Clinically with hemorrhoids. Maximize bowel regimen, increase Linzess, Anusol BID, and return in 6 weeks for hemorrhoid banding.

## 2019-05-11 NOTE — Assessment & Plan Note (Signed)
Doing well. Decrease Prilosec to once daily.

## 2019-06-19 ENCOUNTER — Ambulatory Visit (INDEPENDENT_AMBULATORY_CARE_PROVIDER_SITE_OTHER): Payer: Medicaid Other | Admitting: Gastroenterology

## 2019-06-19 ENCOUNTER — Other Ambulatory Visit: Payer: Self-pay

## 2019-06-19 ENCOUNTER — Encounter: Payer: Self-pay | Admitting: Gastroenterology

## 2019-06-19 VITALS — BP 120/76 | HR 71 | Temp 97.2°F | Ht 64.0 in | Wt 177.6 lb

## 2019-06-19 DIAGNOSIS — K641 Second degree hemorrhoids: Secondary | ICD-10-CM | POA: Diagnosis not present

## 2019-06-19 NOTE — Patient Instructions (Signed)
Continue with Linzess one capsule each morning on an empty stomach.  It would be good to add fiber such as Benefiber daily. You can start with 2 teaspoons a day and increase as tolerated.  Limit toilet time to 2-3 minutes.  We will see you in 2-3 weeks!  I enjoyed seeing you again today! As you know, I value our relationship and want to provide genuine, compassionate, and quality care. I welcome your feedback. If you receive a survey regarding your visit,  I greatly appreciate you taking time to fill this out. See you next time!  Annitta Needs, PhD, ANP-BC [facility]

## 2019-06-19 NOTE — Progress Notes (Signed)
  [name]:    48 year old female with history of constipation, hemorrhoids, banding in 2018 X2 sessions. Colonoscopy in 2016 with hemorrhoids. She has had recurrent symptomatic hemorrhoids in setting of constipation. The patient presents with symptomatic grade 2 hemorrhoids, unresponsive to maximal medical therapy, requesting rubber band ligation of her hemorrhoidal disease. All risks, benefits, and alternative forms of therapy were described and informed consent was obtained.  In the left lateral decubitus position  anoscopic examination revealed grade 2 hemorrhoids predominantly in the right posterior and left lateral positions.  The decision was made to band the right posterior internal hemorrhoid, and the [place] was used to perform band ligation without complication. Digital anorectal examination was then performed to assure proper positioning of the band, and no adjustment was required. The patient was discharged home without pain or other issues. Dietary and behavioral recommendations were given, along with follow-up instructions. The patient will return in 2-3 weeks for followup and possible additional banding as required. Will likely band the left lateral at next appt.  No complications were encountered and the patient tolerated the procedure well.  Alyssa Needs, PhD, ANP-BC [facility]

## 2019-07-10 NOTE — Progress Notes (Signed)
[name]:    48 year old female with history of constipation, hemorrhoids, banding in 2018 X2 sessions. Colonoscopy in 2016 with hemorrhoids. She has had recurrent symptomatic hemorrhoids in setting of constipation. Banding session about a month ago of right posterior. She noted some improvement with this. Still with mild itching. Improved rectal bleeding.  The patient presents with symptomatic grade 2 hemorrhoids, unresponsive to maximal medical therapy, requesting rubber band ligation of her hemorrhoidal disease. All risks, benefits, and alternative forms of therapy were described and informed consent was obtained.  The decision was made to band the left lateral internal hemorrhoid, and the [place] was used to perform band ligation without complication. Digital anorectal examination was then performed to assure proper positioning of the band, and no adjustment was required. The patient was discharged home without pain or other issues. Dietary and behavioral recommendations were given, along with follow-up instructions. The patient will return in 2-3 weeks for followup and possible additional banding as required. Will focus on right anterior likely at next visit.   No complications were encountered and the patient tolerated the procedure well.  Annitta Needs, PhD, ANP-BC [facility]

## 2019-07-11 ENCOUNTER — Encounter: Payer: Self-pay | Admitting: Gastroenterology

## 2019-07-11 ENCOUNTER — Ambulatory Visit (INDEPENDENT_AMBULATORY_CARE_PROVIDER_SITE_OTHER): Payer: Medicaid Other | Admitting: Gastroenterology

## 2019-07-11 ENCOUNTER — Other Ambulatory Visit: Payer: Self-pay

## 2019-07-11 VITALS — BP 111/69 | HR 64 | Temp 97.5°F | Ht 64.0 in | Wt 179.6 lb

## 2019-07-11 DIAGNOSIS — K641 Second degree hemorrhoids: Secondary | ICD-10-CM

## 2019-07-11 NOTE — Patient Instructions (Signed)
Continue to avoid straining. Limit toilet time to 2-3 minutes.  We will see you in 2-3 weeks!  Have a wonderful Christmas!  I enjoyed seeing you again today! As you know, I value our relationship and want to provide genuine, compassionate, and quality care. I welcome your feedback. If you receive a survey regarding your visit,  I greatly appreciate you taking time to fill this out. See you next time!  Annitta Needs, PhD, ANP-BC [facility]

## 2019-08-06 NOTE — Progress Notes (Signed)
[name]:   49 year old female presenting with history of constipation, hemorrhoids, banding in 2018 X2 sessions. Colonoscopy in 2016 with hemorrhoids. She has had recurrent symptomatic hemorrhoids in setting of constipation. Has thus far had right posterior and most recently left lateral banded again. Here to discuss further banding. She notes straining at times and not taking Linzess 145 mcg daily as it is too strong. Toilet time 15 minutes. Still with pressure, intermittent hematochezia, bleeding, but no prolapsing. She notes overall improvement from prior 2 bandings.    The patient presents with symptomatic grade 1 hemorrhoids, unresponsive to maximal medical therapy, requesting rubber band ligation of her hemorrhoidal disease. All risks, benefits, and alternative forms of therapy were described and informed consent was obtained.  The decision was made to band the right anterior internal hemorrhoid, and the [place] was used to perform band ligation without complication. Digital anorectal examination was then performed to assure proper positioning of the band, and to adjust the banded tissue as required. The patient was discharged home without pain or other issues. Dietary and behavioral recommendations were given, along with samples of Linzess 72 mcg daily, as 145 mcg seems to be overshooting the mark.  The patient will return in 3 weeks for followup and possible additional banding as required. Consider banding in neutral position if needed.   No complications were encountered and the patient tolerated the procedure well.  Annitta Needs, PhD, ANP-BC [facility]

## 2019-08-07 ENCOUNTER — Ambulatory Visit (INDEPENDENT_AMBULATORY_CARE_PROVIDER_SITE_OTHER): Payer: Medicaid Other | Admitting: Gastroenterology

## 2019-08-07 ENCOUNTER — Other Ambulatory Visit: Payer: Self-pay

## 2019-08-07 ENCOUNTER — Encounter: Payer: Self-pay | Admitting: Gastroenterology

## 2019-08-07 VITALS — BP 113/76 | HR 71 | Temp 97.9°F | Ht 64.0 in | Wt 184.4 lb

## 2019-08-07 DIAGNOSIS — K640 First degree hemorrhoids: Secondary | ICD-10-CM | POA: Diagnosis not present

## 2019-08-07 NOTE — Patient Instructions (Signed)
I have lowered the dosage of Linzess to 72 micrograms a day. Take this on an empty stomach, 30 minutes before breakfast daily. Let us know how this works for you.  Goal of only 2-3 minutes of toilet time, no straining.  I have arranged an appointment in 3 weeks for possible repeat banding. If you are doing well, we can postpone this but please let us know.  I enjoyed seeing you again today! As you know, I value our relationship and want to provide genuine, compassionate, and quality care. I welcome your feedback. If you receive a survey regarding your visit,  I greatly appreciate you taking time to fill this out. See you next time!  Annitta Needs, PhD, ANP-BC [facility]

## 2019-08-27 ENCOUNTER — Other Ambulatory Visit: Payer: Self-pay | Admitting: Gastroenterology

## 2019-08-27 DIAGNOSIS — K625 Hemorrhage of anus and rectum: Secondary | ICD-10-CM

## 2019-08-27 DIAGNOSIS — K649 Unspecified hemorrhoids: Secondary | ICD-10-CM

## 2019-08-27 DIAGNOSIS — K219 Gastro-esophageal reflux disease without esophagitis: Secondary | ICD-10-CM

## 2019-08-31 ENCOUNTER — Ambulatory Visit (INDEPENDENT_AMBULATORY_CARE_PROVIDER_SITE_OTHER): Payer: Medicaid Other | Admitting: Gastroenterology

## 2019-08-31 ENCOUNTER — Encounter: Payer: Self-pay | Admitting: Gastroenterology

## 2019-08-31 ENCOUNTER — Other Ambulatory Visit: Payer: Self-pay

## 2019-08-31 VITALS — BP 109/67 | HR 63 | Temp 97.1°F | Ht 64.0 in | Wt 182.8 lb

## 2019-08-31 DIAGNOSIS — K640 First degree hemorrhoids: Secondary | ICD-10-CM

## 2019-08-31 NOTE — Progress Notes (Signed)
   49 year old female presenting with history of constipation, hemorrhoids, banding in 2018 X2 sessions. Colonoscopy in 2016 with hemorrhoids. She has had recurrent symptomatic hemorrhoids in setting of constipation. Thus far has had right posterior, left lateral, and right anterior banding. Here for follow-up and consideration of further banding. She has noted mild bleeding and itching. Overall much improved from previously.  The patient presents with symptomatic grade 1 hemorrhoids, unresponsive to maximal medical therapy, requesting rubber band ligation of her hemorrhoidal disease. All risks, benefits, and alternative forms of therapy were described and informed consent was obtained.   The decision was made to band neutrally, and the [place] was used to perform band ligation without complication. Digital anorectal examination was then performed to assure proper positioning of the band, and no adjustment was required. The patient was discharged home without pain or other issues. Dietary and behavioral recommendations were given, along with follow-up instructions. The patient will return in 3 months for routine follow-up.  No complications were encountered and the patient tolerated the procedure well.  Annitta Needs, PhD, ANP-BC [facility]

## 2019-08-31 NOTE — Patient Instructions (Signed)
Continue to avoid straining.  Limit toilet time to 2-3 minutes.  We will see you back in 3 months for routine visit!  I enjoyed seeing you again today! As you know, I value our relationship and want to provide genuine, compassionate, and quality care. I welcome your feedback. If you receive a survey regarding your visit,  I greatly appreciate you taking time to fill this out. See you next time!  Annitta Needs, PhD, ANP-BC [facility]

## 2019-11-28 ENCOUNTER — Ambulatory Visit (INDEPENDENT_AMBULATORY_CARE_PROVIDER_SITE_OTHER): Payer: Medicaid Other | Admitting: Gastroenterology

## 2019-11-28 ENCOUNTER — Other Ambulatory Visit: Payer: Self-pay

## 2019-11-28 ENCOUNTER — Encounter: Payer: Self-pay | Admitting: Gastroenterology

## 2019-11-28 VITALS — BP 117/73 | HR 86 | Temp 97.1°F | Ht 64.0 in | Wt 188.6 lb

## 2019-11-28 DIAGNOSIS — K2970 Gastritis, unspecified, without bleeding: Secondary | ICD-10-CM | POA: Diagnosis not present

## 2019-11-28 DIAGNOSIS — K5900 Constipation, unspecified: Secondary | ICD-10-CM

## 2019-11-28 DIAGNOSIS — B9681 Helicobacter pylori [H. pylori] as the cause of diseases classified elsewhere: Secondary | ICD-10-CM | POA: Insufficient documentation

## 2019-11-28 DIAGNOSIS — K219 Gastro-esophageal reflux disease without esophagitis: Secondary | ICD-10-CM | POA: Diagnosis not present

## 2019-11-28 NOTE — Progress Notes (Signed)
Cc'ed to pcp °

## 2019-11-28 NOTE — Progress Notes (Signed)
Referring Provider: Rosita Fire, MD Primary Care Physician:  Rosita Fire, MD Primary GI: Dr. Gala Romney   Chief Complaint  Patient presents with  . Gastroesophageal Reflux    doing ok    HPI:   Alyssa Aguilar is a 49 y.o. female presenting today with a history of constipation, GERD, hemorrhoids, banding in 2018 X 2 sessions. Nov 2020-Jan 2021 underwent banding of all 3 columns and final was neutral position. Now here for routine office visit.   Prilosec once per day. GERD controlled. No dysphagia. Taking Linzess 145 mcg daily. Stays close to home due to urgency. Doesn't want to decrease the dosage.   Doing well since banding. Asymptomatic.   Need to document H.pylori eradication.    Past Medical History:  Diagnosis Date  . GERD (gastroesophageal reflux disease)   . Helicobacter pylori gastritis 2011   s/p treatment with Prevpac  . Hemorrhoids   . IBS (irritable bowel syndrome)   . Migraines   . S/P colonoscopy March 2011   internal hemorrhoids  . S/P endoscopy March 2011   antral erosions, chronic active gastritis, +H.pylori  . Spina bifida occulta     Past Surgical History:  Procedure Laterality Date  . CHOLECYSTECTOMY    . COLONOSCOPY N/A 05/21/2015   [id number] canal hemorrhoids otherwise normal  . DILATION AND CURETTAGE OF UTERUS    . egd/tcs  09/2009   Rourk-H.Pylori gastritis (s/p tx), benign sb bx, small hh, hemorrhoids, normal TI  . ENDOMETRIAL ABLATION    . HEMORRHOID BANDING    . TUBAL LIGATION      Current Outpatient Medications  Medication Sig Dispense Refill  . EPINEPHrine (EPIPEN IJ) Inject as directed as needed.    . linaclotide (LINZESS) 145 MCG CAPS capsule Take 1 capsule (145 mcg total) by mouth daily before breakfast. 30 capsule 5  . omeprazole (PRILOSEC) 20 MG capsule TAKE 1 CAPSULE BY MOUTH TWICE DAILY BEFORE A MEAL 180 capsule 3  . hydrocortisone (ANUSOL-HC) 2.5 % rectal cream Place 1 application rectally 2 (two) times daily. (Patient  not taking: Reported on 11/28/2019) 30 g 1   No current facility-administered medications for this visit.    Allergies as of 11/28/2019 - Review Complete 11/28/2019  Allergen Reaction Noted  . Bee venom Anaphylaxis 11/06/2011  . Acetaminophen Swelling and Other (See Comments)   . Codeine Other (See Comments)     Family History  Problem Relation Age of Onset  . Colon cancer Maternal Grandmother   . Cancer Maternal Grandmother        Breast Cancer  . Breast cancer Maternal Grandmother   . Hypertension Maternal Grandmother   . Diabetes Maternal Grandmother   . Heart disease Maternal Grandmother   . Asthma Mother   . Colon cancer Maternal Uncle     Social History   Socioeconomic History  . Marital status: Single    Spouse name: Not on file  . Number of children: 1  . Years of education: Not on file  . Highest education level: Not on file  Occupational History  . Occupation: disabled    Comment: "spinal issues"  Tobacco Use  . Smoking status: Current Every Day Smoker    Packs/day: 2.50    Years: 23.00    Pack years: 57.50    Types: Cigarettes  . Smokeless tobacco: Never Used  . Tobacco comment: one pack a day  Substance and Sexual Activity  . Alcohol use: No  . Drug use: No  .  Sexual activity: Yes    Birth control/protection: Surgical  Other Topics Concern  . Not on file  Social History Narrative   Lives w/ grandmother & daughter   Social Determinants of Health   Financial Resource Strain:   . Difficulty of Paying Living Expenses:   Food Insecurity:   . Worried About Charity fundraiser in the Last Year:   . Arboriculturist in the Last Year:   Transportation Needs:   . Film/video editor (Medical):   Marland Kitchen Lack of Transportation (Non-Medical):   Physical Activity:   . Days of Exercise per Week:   . Minutes of Exercise per Session:   Stress:   . Feeling of Stress :   Social Connections:   . Frequency of Communication with Friends and Family:   .  Frequency of Social Gatherings with Friends and Family:   . Attends Religious Services:   . Active Member of Clubs or Organizations:   . Attends Archivist Meetings:   Marland Kitchen Marital Status:     Review of Systems: Gen: Denies fever, chills, anorexia. Denies fatigue, weakness, weight loss.  CV: Denies chest pain, palpitations, syncope, peripheral edema, and claudication. Resp: Denies dyspnea at rest, cough, wheezing, coughing up blood, and pleurisy. GI: see HPI Derm: Denies rash, itching, dry skin Psych: Denies depression, anxiety, memory loss, confusion. No homicidal or suicidal ideation.  Heme: Denies bruising, bleeding, and enlarged lymph nodes.  Physical Exam: BP 117/73   Pulse 86   Temp (!) 97.1 F (36.2 C) (Temporal)   Ht 5\' 4"  (1.626 m)   Wt 188 lb 9.6 oz (85.5 kg)   BMI 32.37 kg/m  General:   Alert and oriented. No distress noted. Pleasant and cooperative.  Head:  Normocephalic and atraumatic. Eyes:  Conjuctiva clear without scleral icterus. Mouth:  Mask in place Abdomen:  +BS, soft, non-tender and non-distended. No rebound or guarding. No HSM or masses noted. Msk:  Symmetrical without gross deformities. Normal posture. Extremities:  Without edema. Neurologic:  Alert and  oriented x4 Psych:  Alert and cooperative. Normal mood and affect.  ASSESSMENT: Alyssa Aguilar is a 49 y.o. female presenting today with history of constipation, GERD, hemorrhoids s/p banding, remote history of H.pylori gastritis and need to document eradication.  Constipation: doing well with Linzess 145 mcg daily.  GERD: controlled with omeprazole once daily.  H.pylori gastritis: in remote past. I do not see where we have documented eradication.    PLAN:   Hold Prilosec X 2 weeks, then complete H.pylori breath test  Resume Prilosec once urea breath test completed.  Continue Linzess once daily  Return in 6-8 months   Annitta Needs, PhD, [facility]

## 2019-11-28 NOTE — Patient Instructions (Signed)
Don't take Prilosec tomorrow through May 13th.  On May 13th, please have the test done at the lab. Do not drink or eat anything after midnight to do this. Once you have completed it, you can restart Prilosec.  We will see you in 6-8 months!   I enjoyed seeing you again today! As you know, I value our relationship and want to provide genuine, compassionate, and quality care. I welcome your feedback. If you receive a survey regarding your visit,  I greatly appreciate you taking time to fill this out. See you next time!  Annitta Needs, PhD, ANP-BC [facility]enter[facility]

## 2019-12-13 ENCOUNTER — Ambulatory Visit: Payer: Medicaid Other | Attending: Internal Medicine

## 2019-12-13 DIAGNOSIS — Z23 Encounter for immunization: Secondary | ICD-10-CM

## 2019-12-13 NOTE — Progress Notes (Signed)
   Covid-19 Vaccination Clinic  Name:  Alyssa Aguilar    MRN: [id number] DOB: [birth year]  12/13/2019  [name] was observed post Covid-19 immunization for 15 minutes without incident. She was provided with Vaccine Information Sheet and instruction to access the V-Safe system.   [name] was instructed to call 911 with any severe reactions post vaccine: Marland Kitchen Difficulty breathing  . Swelling of face and throat  . A fast heartbeat  . A bad rash all over body  . Dizziness and weakness   Immunizations Administered    Name Date Dose VIS Date Route   Moderna COVID-19 Vaccine 12/13/2019 10:32 AM 0.5 mL 07/2019 Intramuscular   Manufacturer: Moderna   Lot: [id number]   [id number]

## 2019-12-14 LAB — H. PYLORI BREATH TEST: H. pylori Breath Test: NOT DETECTED

## 2019-12-21 ENCOUNTER — Telehealth: Payer: Self-pay | Admitting: Obstetrics & Gynecology

## 2019-12-21 NOTE — Telephone Encounter (Signed)

## 2019-12-24 ENCOUNTER — Other Ambulatory Visit: Payer: Self-pay | Admitting: Obstetrics & Gynecology

## 2019-12-24 ENCOUNTER — Ambulatory Visit (INDEPENDENT_AMBULATORY_CARE_PROVIDER_SITE_OTHER): Payer: Medicaid Other | Admitting: Obstetrics & Gynecology

## 2019-12-24 ENCOUNTER — Encounter: Payer: Self-pay | Admitting: Obstetrics & Gynecology

## 2019-12-24 ENCOUNTER — Other Ambulatory Visit (HOSPITAL_COMMUNITY)
Admission: RE | Admit: 2019-12-24 | Discharge: 2019-12-24 | Disposition: A | Discharge status: Home / Self Care | Payer: Medicaid Other | Source: Ambulatory Visit | Attending: Obstetrics & Gynecology | Admitting: Obstetrics & Gynecology

## 2019-12-24 VITALS — BP 104/71 | HR 76 | Ht 64.0 in | Wt 180.0 lb

## 2019-12-24 DIAGNOSIS — Z01419 Encounter for gynecological examination (general) (routine) without abnormal findings: Secondary | ICD-10-CM

## 2019-12-24 DIAGNOSIS — N841 Polyp of cervix uteri: Secondary | ICD-10-CM | POA: Diagnosis not present

## 2019-12-24 DIAGNOSIS — Z01411 Encounter for gynecological examination (general) (routine) with abnormal findings: Secondary | ICD-10-CM | POA: Diagnosis not present

## 2019-12-24 NOTE — Progress Notes (Signed)
Subjective:     Alyssa Aguilar is a 49 y.o. female here for a routine exam.  No LMP recorded. Patient has had an ablation. G1P0100 Birth Control Method:  BTL Menstrual Calendar(currently): amenorrheic s/p ablation  Current complaints: none.   Current acute medical issues:  none   Recent Gynecologic History No LMP recorded. Patient has had an ablation. Last Pap: 2019,  normal Last mammogram: 2019,  normal  Past Medical History:  Diagnosis Date  . GERD (gastroesophageal reflux disease)   . Helicobacter pylori gastritis 2011   s/p treatment with Prevpac  . Hemorrhoids   . IBS (irritable bowel syndrome)   . Migraines   . S/P colonoscopy March 2011   internal hemorrhoids  . S/P endoscopy March 2011   antral erosions, chronic active gastritis, +H.pylori  . Spina bifida occulta     Past Surgical History:  Procedure Laterality Date  . CHOLECYSTECTOMY    . COLONOSCOPY N/A 05/21/2015   [id number] canal hemorrhoids otherwise normal  . DILATION AND CURETTAGE OF UTERUS    . egd/tcs  09/2009   Rourk-H.Pylori gastritis (s/p tx), benign sb bx, small hh, hemorrhoids, normal TI  . ENDOMETRIAL ABLATION    . HEMORRHOID BANDING    . TUBAL LIGATION      OB History    Gravida  1   Para  1   Term      Preterm  1   AB      Living        SAB      TAB      Ectopic      Multiple      Live Births              Social History   Socioeconomic History  . Marital status: Single    Spouse name: Not on file  . Number of children: 1  . Years of education: Not on file  . Highest education level: Not on file  Occupational History  . Occupation: disabled    Comment: "spinal issues"  Tobacco Use  . Smoking status: Current Every Day Smoker    Packs/day: 2.50    Years: 23.00    Pack years: 57.50    Types: Cigarettes  . Smokeless tobacco: Never Used  . Tobacco comment: one pack a day  Substance and Sexual Activity  . Alcohol use: No  . Drug use: No  . Sexual activity: Yes     Birth control/protection: Surgical  Other Topics Concern  . Not on file  Social History Narrative   Lives w/ grandmother & daughter   Social Determinants of Health   Financial Resource Strain: Medium Risk  . Difficulty of Paying Living Expenses: Somewhat hard  Food Insecurity: Food Insecurity Present  . Worried About Charity fundraiser in the Last Year: Sometimes true  . Ran Out of Food in the Last Year: Sometimes true  Transportation Needs: No Transportation Needs  . Lack of Transportation (Medical): No  . Lack of Transportation (Non-Medical): No  Physical Activity: Sufficiently Active  . Days of Exercise per Week: 6 days  . Minutes of Exercise per Session: 90 min  Stress: No Stress Concern Present  . Feeling of Stress : Only a little  Social Connections: Somewhat Isolated  . Frequency of Communication with Friends and Family: More than three times a week  . Frequency of Social Gatherings with Friends and Family: More than three times a week  . Attends Religious Services: 1 to  4 times per year  . Active Member of Clubs or Organizations: No  . Attends Archivist Meetings: Never  . Marital Status: Never married    Family History  Problem Relation Age of Onset  . Colon cancer Maternal Grandmother   . Cancer Maternal Grandmother        Breast Cancer  . Breast cancer Maternal Grandmother   . Hypertension Maternal Grandmother   . Diabetes Maternal Grandmother   . Heart disease Maternal Grandmother   . Asthma Mother   . Colon cancer Maternal Uncle      Current Outpatient Medications:  .  linaclotide (LINZESS) 145 MCG CAPS capsule, Take 1 capsule (145 mcg total) by mouth daily before breakfast., Disp: 30 capsule, Rfl: 5 .  omeprazole (PRILOSEC) 20 MG capsule, TAKE 1 CAPSULE BY MOUTH TWICE DAILY BEFORE A MEAL, Disp: 180 capsule, Rfl: 3 .  EPINEPHrine (EPIPEN IJ), Inject as directed as needed., Disp: , Rfl:   Review of Systems  Review of Systems   Constitutional: Negative for fever, chills, weight loss, malaise/fatigue and diaphoresis.  HENT: Negative for hearing loss, ear pain, nosebleeds, congestion, sore throat, neck pain, tinnitus and ear discharge.   Eyes: Negative for blurred vision, double vision, photophobia, pain, discharge and redness.  Respiratory: Negative for cough, hemoptysis, sputum production, shortness of breath, wheezing and stridor.   Cardiovascular: Negative for chest pain, palpitations, orthopnea, claudication, leg swelling and PND.  Gastrointestinal: negative for abdominal pain. Negative for heartburn, nausea, vomiting, diarrhea, constipation, blood in stool and melena.  Genitourinary: Negative for dysuria, urgency, frequency, hematuria and flank pain.  Musculoskeletal: Negative for myalgias, back pain, joint pain and falls.  Skin: Negative for itching and rash.  Neurological: Negative for dizziness, tingling, tremors, sensory change, speech change, focal weakness, seizures, loss of consciousness, weakness and headaches.  Endo/Heme/Allergies: Negative for environmental allergies and polydipsia. Does not bruise/bleed easily.  Psychiatric/Behavioral: Negative for depression, suicidal ideas, hallucinations, memory loss and substance abuse. The patient is not nervous/anxious and does not have insomnia.        Objective:  Blood pressure 104/71, pulse 76, height 5\' 4"  (1.626 m), weight 180 lb (81.6 kg).   Physical Exam  Vitals reviewed. Constitutional: She is oriented to person, place, and time. She appears well-developed and well-nourished.  HENT:  Head: Normocephalic and atraumatic.        Right Ear: External ear normal.  Left Ear: External ear normal.  Nose: Nose normal.  Mouth/Throat: Oropharynx is clear and moist.  Eyes: Conjunctivae and EOM are normal. Pupils are equal, round, and reactive to light. Right eye exhibits no discharge. Left eye exhibits no discharge. No scleral icterus.  Neck: Normal range of  motion. Neck supple. No tracheal deviation present. No thyromegaly present.  Cardiovascular: Normal rate, regular rhythm, normal heart sounds and intact distal pulses.  Exam reveals no gallop and no friction rub.   No murmur heard. Respiratory: Effort normal and breath sounds normal. No respiratory distress. She has no wheezes. She has no rales. She exhibits no tenderness.  GI: Soft. Bowel sounds are normal. She exhibits no distension and no mass. There is no tenderness. There is no rebound and no guarding.  Genitourinary:  Breasts no masses skin changes or nipple changes bilaterally      Vulva is normal without lesions Vagina is pink moist without discharge Cervix normal in appearance and pap is done, small polyp present, removed with ring forcep Uterus is normal size shape and contour Adnexa is negative with  normal sized ovaries   Musculoskeletal: Normal range of motion. She exhibits no edema and no tenderness.  Neurological: She is alert and oriented to person, place, and time. She has normal reflexes. She displays normal reflexes. No cranial nerve deficit. She exhibits normal muscle tone. Coordination normal.  Skin: Skin is warm and dry. No rash noted. No erythema. No pallor.  Psychiatric: She has a normal mood and affect. Her behavior is normal. Judgment and thought content normal.       Medications Ordered at today's visit: No orders of the defined types were placed in this encounter.   Other orders placed at today's visit: No orders of the defined types were placed in this encounter.     Assessment:    Normal Gyn exam.   cervical polyp Plan:    Contraception: tubal ligation. Mammogram ordered. Follow up in: 3 years.     No follow-ups on file.

## 2019-12-26 LAB — CYTOLOGY - PAP
Chlamydia: POSITIVE — AB
Comment: NEGATIVE
Comment: NEGATIVE
Comment: NORMAL
Diagnosis: NEGATIVE
High risk HPV: NEGATIVE
Neisseria Gonorrhea: NEGATIVE

## 2020-01-10 ENCOUNTER — Ambulatory Visit: Payer: Medicaid Other | Attending: Internal Medicine

## 2020-01-10 DIAGNOSIS — Z23 Encounter for immunization: Secondary | ICD-10-CM

## 2020-01-10 NOTE — Progress Notes (Signed)
   Covid-19 Vaccination Clinic  Name:  Alyssa Aguilar    MRN: [id number] DOB: 1970/11/09  01/10/2020  [name] was observed post Covid-19 immunization for 15 minutes without incident. She was provided with Vaccine Information Sheet and instruction to access the V-Safe system.   Alyssa Aguilar was instructed to call 911 with any severe reactions post vaccine: Marland Kitchen Difficulty breathing  . Swelling of face and throat  . A fast heartbeat  . A bad rash all over body  . Dizziness and weakness   Immunizations Administered    Name Date Dose VIS Date Route   Moderna COVID-19 Vaccine 01/10/2020 12:09 PM 0.5 mL 07/2019 Intramuscular   Manufacturer: Moderna   Lot: [id number]   [place]: [id number]

## 2020-01-14 ENCOUNTER — Telehealth: Payer: Self-pay | Admitting: Obstetrics & Gynecology

## 2020-01-14 MED ORDER — AZITHROMYCIN 500 MG PO TABS: ORAL_TABLET | ORAL | 1 refills | Status: DC

## 2020-01-16 ENCOUNTER — Telehealth: Payer: Self-pay | Admitting: *Deleted

## 2020-01-16 NOTE — Telephone Encounter (Signed)
Patient informed she has tested positive for chlamydia.  Medication has been sent to her pharmacy and should avoid sex for 7 days.  Will need to come for POC in 3-4 weeks.  Partner needs treatment as well.  Pt verbalized understanding.

## 2020-02-07 ENCOUNTER — Other Ambulatory Visit: Payer: Self-pay

## 2020-02-13 ENCOUNTER — Other Ambulatory Visit (INDEPENDENT_AMBULATORY_CARE_PROVIDER_SITE_OTHER): Payer: Medicaid Other | Admitting: *Deleted

## 2020-02-13 DIAGNOSIS — Z202 Contact with and (suspected) exposure to infections with a predominantly sexual mode of transmission: Secondary | ICD-10-CM

## 2020-02-13 NOTE — Progress Notes (Signed)
Chart reviewed for nurse visit. Agree with plan of care.  Estill Dooms, NP 02/13/2020 12:07 PM

## 2020-02-13 NOTE — Progress Notes (Addendum)
   NURSE VISIT- VAGINITIS/STD/POC  SUBJECTIVE:  Alyssa Aguilar is a 49 y.o. G1P0100 GYN patientfemale here for a urine  for proof of cure after treatment for Chlamydia.  She reports the following symptoms: none for 0 days. Denies abnormal vaginal bleeding, significant pelvic pain, fever, or UTI symptoms.  OBJECTIVE:  There were no vitals taken for this visit.  Appears well, in no apparent distress  ASSESSMENT: Urine sample for proof of cure after treatment for chlamydia  PLAN: Urine sample for Gonorrhea, Chlamydia sent to lab. Pt refused to do vaginal swab. Treatment: to be determined once results are received Follow-up as needed if symptoms persist/worsen, or new symptoms develop  Janece Canterbury  02/13/2020 9:37 AM

## 2020-02-13 NOTE — Addendum Note (Signed)
Addended by: Janece Canterbury on: 02/13/2020 09:45 AM   Modules accepted: Orders

## 2020-02-15 ENCOUNTER — Telehealth: Payer: Self-pay | Admitting: Adult Health

## 2020-02-15 ENCOUNTER — Telehealth: Payer: Self-pay | Admitting: *Deleted

## 2020-02-15 LAB — GC/CHLAMYDIA PROBE AMP
Chlamydia trachomatis, NAA: NEGATIVE
Neisseria Gonorrhoeae by PCR: NEGATIVE

## 2020-02-15 NOTE — Telephone Encounter (Signed)
Pt aware vaginal swab was negative for GC/CHL. Pt voiced understanding. [place]

## 2020-02-15 NOTE — Telephone Encounter (Signed)
Pt aware vaginal swab negative for GC/CHL. Pt voiced understanding. [place]

## 2020-02-15 NOTE — Telephone Encounter (Signed)
-----   Message from Estill Dooms, NP sent at 02/15/2020  8:03 AM EDT ----- Let pt know vaginal swab negative for GC/CHL,thx

## 2020-02-15 NOTE — Telephone Encounter (Signed)
Left message @ 9:07 am. JSY 

## 2020-02-15 NOTE — Telephone Encounter (Signed)
Patient returned nurse call.

## 2020-04-14 ENCOUNTER — Ambulatory Visit (HOSPITAL_COMMUNITY): Payer: Medicaid Other

## 2020-05-28 ENCOUNTER — Ambulatory Visit (INDEPENDENT_AMBULATORY_CARE_PROVIDER_SITE_OTHER): Payer: Medicaid Other | Admitting: Gastroenterology

## 2020-05-28 ENCOUNTER — Other Ambulatory Visit: Payer: Self-pay

## 2020-05-28 ENCOUNTER — Encounter: Payer: Self-pay | Admitting: Gastroenterology

## 2020-05-28 VITALS — BP 122/76 | HR 72 | Temp 97.1°F | Ht 64.0 in | Wt 186.8 lb

## 2020-05-28 DIAGNOSIS — R1310 Dysphagia, unspecified: Secondary | ICD-10-CM | POA: Diagnosis not present

## 2020-05-28 DIAGNOSIS — K5900 Constipation, unspecified: Secondary | ICD-10-CM | POA: Diagnosis not present

## 2020-05-28 MED ORDER — LINACLOTIDE 290 MCG PO CAPS: 290.0000 ug | ORAL_CAPSULE | Freq: Every day | ORAL | 3 refills | Status: DC

## 2020-05-28 MED ORDER — HYDROCORTISONE (PERIANAL) 2.5 % EX CREA: 1.0000 "application " | TOPICAL_CREAM | Freq: Two times a day (BID) | CUTANEOUS | 1 refills | Status: DC

## 2020-05-28 NOTE — Patient Instructions (Signed)
Let's increase Linzess to the 290 microgram capsules. If you don't have this at home, I sent it to your pharmacy. Take this instead of the 145 microgram Linzess capsules.  I have sent in Anusol cream to use per rectum twice a day as needed. Don't strain, limit toilet time to 2-3 minutes. If you still have issues or see bleeding, we may need to update a colonoscopy.  We are arranging an upper endoscopy with dilation in the near future!  I will see you in 3-4 months!  I enjoyed seeing you again today! As you know, I value our relationship and want to provide genuine, compassionate, and quality care. I welcome your feedback. If you receive a survey regarding your visit,  I greatly appreciate you taking time to fill this out. See you next time!  Annitta Needs, PhD, ANP-BC [facility]

## 2020-05-28 NOTE — Progress Notes (Signed)
Referring Provider: Rosita Fire, MD Primary Care Physician:  Rosita Fire, MD Primary GI: Dr. Gala Romney   Chief Complaint  Patient presents with  . Hemorrhoids    occas RB, burning, itching at times  . Constipation    "sometimes"    HPI:   Alyssa Aguilar is a 49 y.o. female presenting today with a history of constipation, GERD, hemorrhoids, banding in 2018 X 2 sessions. Nov 2020-Jan 2021 underwent banding of all 3 columns and final was neutral position. Here for routine follow-up.   Constipation: Linzess 145 mcg daily. Straining at times. Notes itching/burning rectum occasionally and small amount of tissue hematochezia recently.   GERD: well-controlled on omeprazole BID. Notes new onset solid food dysphagia. Last EGD 2011.   H.pylori gastritis in remote past: documented eradication on file recently with breath test.   Past Medical History:  Diagnosis Date  . GERD (gastroesophageal reflux disease)   . Helicobacter pylori gastritis 2011   s/p treatment with Prevpac  . Hemorrhoids   . IBS (irritable bowel syndrome)   . Migraines   . S/P colonoscopy March 2011   internal hemorrhoids  . S/P endoscopy March 2011   antral erosions, chronic active gastritis, +H.pylori  . Spina bifida occulta     Past Surgical History:  Procedure Laterality Date  . CHOLECYSTECTOMY    . COLONOSCOPY N/A 05/21/2015   GEX:[id number] canal hemorrhoids otherwise normal  . DILATION AND CURETTAGE OF UTERUS    . egd/tcs  09/2009   Rourk-H.Pylori gastritis (s/p tx), benign sb bx, small hh, hemorrhoids, normal TI  . ENDOMETRIAL ABLATION    . HEMORRHOID BANDING    . TUBAL LIGATION      Current Outpatient Medications  Medication Sig Dispense Refill  . EPINEPHrine (EPIPEN IJ) Inject as directed as needed.    . linaclotide (LINZESS) 145 MCG CAPS capsule Take 1 capsule (145 mcg total) by mouth daily before breakfast. 30 capsule 5  . omeprazole (PRILOSEC) 20 MG capsule TAKE 1 CAPSULE BY MOUTH TWICE  DAILY BEFORE A MEAL 180 capsule 3  . hydrocortisone (ANUSOL-HC) 2.5 % rectal cream Place 1 application rectally 2 (two) times daily. Per rectum for itching. 30 g 1  . linaclotide (LINZESS) 290 MCG CAPS capsule Take 1 capsule (290 mcg total) by mouth daily before breakfast. 30 minutes before breakfast 90 capsule 3   No current facility-administered medications for this visit.    Allergies as of 05/28/2020 - Review Complete 05/28/2020  Allergen Reaction Noted  . Bee venom Anaphylaxis 11/06/2011  . Acetaminophen Swelling and Other (See Comments)   . Codeine Other (See Comments)     Family History  Problem Relation Age of Onset  . Colon cancer Maternal Grandmother   . Cancer Maternal Grandmother        Breast Cancer  . Breast cancer Maternal Grandmother   . Hypertension Maternal Grandmother   . Diabetes Maternal Grandmother   . Heart disease Maternal Grandmother   . Asthma Mother   . Colon cancer Maternal Uncle     Social History   Socioeconomic History  . Marital status: Single    Spouse name: Not on file  . Number of children: 1  . Years of education: Not on file  . Highest education level: Not on file  Occupational History  . Occupation: disabled    Comment: "spinal issues"  Tobacco Use  . Smoking status: Current Every Day Smoker    Packs/day: 2.50    Years: 23.00  Pack years: 57.50    Types: Cigarettes  . Smokeless tobacco: Never Used  . Tobacco comment: one pack a day  Vaping Use  . Vaping Use: Never used  Substance and Sexual Activity  . Alcohol use: No  . Drug use: No  . Sexual activity: Yes    Birth control/protection: Surgical  Other Topics Concern  . Not on file  Social History Narrative   Lives w/ grandmother & daughter   Social Determinants of Health   Financial Resource Strain: Medium Risk  . Difficulty of Paying Living Expenses: Somewhat hard  Food Insecurity: Food Insecurity Present  . Worried About Charity fundraiser in the Last Year:  Sometimes true  . Ran Out of Food in the Last Year: Sometimes true  Transportation Needs: No Transportation Needs  . Lack of Transportation (Medical): No  . Lack of Transportation (Non-Medical): No  Physical Activity: Sufficiently Active  . Days of Exercise per Week: 6 days  . Minutes of Exercise per Session: 90 min  Stress: No Stress Concern Present  . Feeling of Stress : Only a little  Social Connections: Moderately Isolated  . Frequency of Communication with Friends and Family: More than three times a week  . Frequency of Social Gatherings with Friends and Family: More than three times a week  . Attends Religious Services: 1 to 4 times per year  . Active Member of Clubs or Organizations: No  . Attends Archivist Meetings: Never  . Marital Status: Never married    Review of Systems: Gen: Denies fever, chills, anorexia. Denies fatigue, weakness, weight loss.  CV: Denies chest pain, palpitations, syncope, peripheral edema, and claudication. Resp: Denies dyspnea at rest, cough, wheezing, coughing up blood, and pleurisy. GI: see HPI Derm: Denies rash, itching, dry skin Psych: Denies depression, anxiety, memory loss, confusion. No homicidal or suicidal ideation.  Heme: see HPI  Physical Exam: BP 122/76   Pulse 72   Temp (!) 97.1 F (36.2 C)   Ht 5\' 4"  (1.626 m)   Wt 186 lb 12.8 oz (84.7 kg)   BMI 32.06 kg/m  General:   Alert and oriented. No distress noted. Pleasant and cooperative.  Head:  Normocephalic and atraumatic. Eyes:  Conjuctiva clear without scleral icterus. Mouth:  Good dentition.  Lungs: clear bilaterally Cardiac: S1 S2 present without murmurs Abdomen:  +BS, soft, non-tender and non-distended. No rebound or guarding. No HSM or masses noted. Msk:  Symmetrical without gross deformities. Normal posture. Extremities:  Without edema. Neurologic:  Alert and  oriented x4 Psych:  Alert and cooperative. Normal mood and affect.  ASSESSMENT: Alyssa Aguilar is a  48 y.o. female presenting today with history of constipation, hemorrhoids s/p banding, GERD, now reporting solid food dysphagia.  GERD well-controlled on omeprazole BID, but with new onset solid food dysphagia, will pursue EGD/dilation. Last in remote past (2011).   Constipation not ideally managed and noting straining. Itching/burning occasionally per rectum. Previously has been banded. 2016 last colonoscopy with internal hemorrhoids. Increase Linzess to 290 mcg daily, Anusol provided, and low threshold for colonoscopy if persistent despite these measures.    PLAN:  Proceed with upper endoscopy/dilation by Dr. Gala Romney in near future: the risks, benefits, and alternatives have been discussed with the patient in detail. The patient states understanding and desires to proceed.   Continue omeprazole BID  Increase Linzess to 290 mcg daily from 145 mcg daily  Anusol cream BID  Return in 3-4 months for close follow-up  Alyssa Aguilar.  Alyssa Bender, PhD, ANP-BC [facility]

## 2020-06-09 ENCOUNTER — Other Ambulatory Visit (HOSPITAL_COMMUNITY)
Admission: RE | Admit: 2020-06-09 | Discharge: 2020-06-09 | Disposition: A | Discharge status: Home / Self Care | Payer: Medicaid Other | Source: Ambulatory Visit | Attending: Internal Medicine | Admitting: Internal Medicine

## 2020-06-09 ENCOUNTER — Other Ambulatory Visit: Payer: Self-pay

## 2020-06-09 DIAGNOSIS — Z20822 Contact with and (suspected) exposure to covid-19: Secondary | ICD-10-CM | POA: Insufficient documentation

## 2020-06-09 DIAGNOSIS — Z01812 Encounter for preprocedural laboratory examination: Secondary | ICD-10-CM | POA: Insufficient documentation

## 2020-06-09 LAB — SARS CORONAVIRUS 2 (TAT 6-24 HRS): SARS Coronavirus 2: NEGATIVE

## 2020-06-11 ENCOUNTER — Ambulatory Visit (HOSPITAL_COMMUNITY): Admission: RE | Admit: 2020-06-11 | Payer: Medicaid Other | Source: Home / Self Care | Admitting: Internal Medicine

## 2020-06-11 ENCOUNTER — Telehealth: Payer: Self-pay | Admitting: Internal Medicine

## 2020-06-11 ENCOUNTER — Encounter (HOSPITAL_COMMUNITY): Admission: RE | Payer: Self-pay | Source: Home / Self Care

## 2020-06-11 SURGERY — EGD (ESOPHAGOGASTRODUODENOSCOPY)
Anesthesia: Moderate Sedation

## 2020-06-11 NOTE — Telephone Encounter (Signed)
EGD/DIL scheduled with Dr. Gala Romney for today.   Tried to call pt, LMOVM for return call.

## 2020-06-11 NOTE — Telephone Encounter (Signed)
Patient needs to reschedule her procedure 

## 2020-06-12 NOTE — Telephone Encounter (Signed)
Letter mailed to pt.  

## 2020-09-02 ENCOUNTER — Ambulatory Visit: Payer: Medicaid Other | Admitting: Gastroenterology

## 2020-09-06 ENCOUNTER — Other Ambulatory Visit: Payer: Self-pay | Admitting: Nurse Practitioner

## 2020-09-06 DIAGNOSIS — K625 Hemorrhage of anus and rectum: Secondary | ICD-10-CM

## 2020-09-06 DIAGNOSIS — K219 Gastro-esophageal reflux disease without esophagitis: Secondary | ICD-10-CM

## 2020-09-06 DIAGNOSIS — K649 Unspecified hemorrhoids: Secondary | ICD-10-CM

## 2020-09-23 ENCOUNTER — Ambulatory Visit (HOSPITAL_COMMUNITY)
Admission: RE | Admit: 2020-09-23 | Discharge: 2020-09-23 | Disposition: A | Discharge status: Home / Self Care | Payer: Medicaid Other | Source: Ambulatory Visit | Attending: Gerontology | Admitting: Gerontology

## 2020-09-23 ENCOUNTER — Other Ambulatory Visit (HOSPITAL_COMMUNITY): Payer: Self-pay | Admitting: Gerontology

## 2020-09-23 ENCOUNTER — Other Ambulatory Visit: Payer: Self-pay

## 2020-09-23 DIAGNOSIS — R059 Cough, unspecified: Secondary | ICD-10-CM | POA: Diagnosis not present

## 2020-10-16 ENCOUNTER — Other Ambulatory Visit: Payer: Self-pay

## 2020-10-16 ENCOUNTER — Ambulatory Visit (INDEPENDENT_AMBULATORY_CARE_PROVIDER_SITE_OTHER): Payer: Medicaid Other | Admitting: Gastroenterology

## 2020-10-16 ENCOUNTER — Encounter: Payer: Self-pay | Admitting: Gastroenterology

## 2020-10-16 VITALS — BP 125/77 | HR 64 | Temp 96.9°F | Ht 64.0 in | Wt 187.8 lb

## 2020-10-16 DIAGNOSIS — K5900 Constipation, unspecified: Secondary | ICD-10-CM | POA: Diagnosis not present

## 2020-10-16 DIAGNOSIS — K219 Gastro-esophageal reflux disease without esophagitis: Secondary | ICD-10-CM | POA: Diagnosis not present

## 2020-10-16 NOTE — Progress Notes (Signed)
Referring Provider: Rosita Fire, MD Primary Care Physician:  Rosita Fire, MD Primary GI: Dr Gala Romney  Chief Complaint  Patient presents with  . Dysphagia    Feels like food getting stuck in throat  . Constipation    BM's daily    HPI:   Alyssa Aguilar is a 50 y.o. female presenting today with a history of constipation, GERD,hemorrhoids, banding in 2018 X 2 sessions. Nov 2020-Jan 2021 underwent banding of all 3 columns and final was neutral position. At last visit in Oct 2021, she had noted new onset solid food dysphagia. Last EGD in 2011. EGD/dilation had been arranged but needed to be rescheduled.  Had to cancel EGD due to transportation. Will swallow food and then start gagging at times. Continues with solid food dysphagia. No pill dysphagia. No odynophagia. Omeprazole BID.  Linzess 290 mcg daily. Increased from 145 mcg at last visit. BM daily. No abdominal pain or overt GI bleeding.     Past Medical History:  Diagnosis Date  . GERD (gastroesophageal reflux disease)   . Helicobacter pylori gastritis 2011   s/p treatment with Prevpac. ERADICATION DOCUMENTED VIA BREATH TEST  . Hemorrhoids   . IBS (irritable bowel syndrome)   . Migraines   . S/P colonoscopy March 2011   internal hemorrhoids  . S/P endoscopy March 2011   antral erosions, chronic active gastritis, +H.pylori  . Spina bifida occulta     Past Surgical History:  Procedure Laterality Date  . CHOLECYSTECTOMY    . COLONOSCOPY N/A 05/21/2015   OZY:YQMG canal hemorrhoids otherwise normal  . DILATION AND CURETTAGE OF UTERUS    . egd/tcs  09/2009   Rourk-H.Pylori gastritis (s/p tx), benign sb bx, small hh, hemorrhoids, normal TI  . ENDOMETRIAL ABLATION    . HEMORRHOID BANDING    . TUBAL LIGATION      Current Outpatient Medications  Medication Sig Dispense Refill  . AIMOVIG 140 MG/ML SOAJ Inject 140 mg into the skin every 28 (twenty-eight) days.    Marland Kitchen EPINEPHrine 0.3 mg/0.3 mL IJ SOAJ injection Inject 0.3  mg into the muscle as needed for anaphylaxis.    . hydrocortisone (ANUSOL-HC) 2.5 % rectal cream Place 1 application rectally 2 (two) times daily. Per rectum for itching. (Patient taking differently: Place 1 application rectally 2 (two) times daily as needed (itching.).) 30 g 1  . linaclotide (LINZESS) 290 MCG CAPS capsule Take 1 capsule (290 mcg total) by mouth daily before breakfast. 30 minutes before breakfast 90 capsule 3  . omeprazole (PRILOSEC) 20 MG capsule TAKE 1 CAPSULE BY MOUTH TWICE DAILY BEFORE A MEAL 180 capsule 3  . linaclotide (LINZESS) 145 MCG CAPS capsule Take 145 mcg by mouth daily before breakfast. (Patient not taking: Reported on 10/16/2020)     No current facility-administered medications for this visit.    Allergies as of 10/16/2020 - Review Complete 10/16/2020  Allergen Reaction Noted  . Bee venom Anaphylaxis 11/06/2011  . Acetaminophen Swelling and Other (See Comments)   . Codeine Other (See Comments)     Family History  Problem Relation Age of Onset  . Colon cancer Maternal Grandmother   . Cancer Maternal Grandmother        Breast Cancer  . Breast cancer Maternal Grandmother   . Hypertension Maternal Grandmother   . Diabetes Maternal Grandmother   . Heart disease Maternal Grandmother   . Asthma Mother   . Colon cancer Maternal Uncle     Social History   Socioeconomic History  .  Marital status: Single    Spouse name: Not on file  . Number of children: 1  . Years of education: Not on file  . Highest education level: Not on file  Occupational History  . Occupation: disabled    Comment: "spinal issues"  Tobacco Use  . Smoking status: Current Every Day Smoker    Packs/day: 2.50    Years: 23.00    Pack years: 57.50    Types: Cigarettes  . Smokeless tobacco: Never Used  . Tobacco comment: one pack a day  Vaping Use  . Vaping Use: Never used  Substance and Sexual Activity  . Alcohol use: No  . Drug use: No  . Sexual activity: Yes    Birth  control/protection: Surgical  Other Topics Concern  . Not on file  Social History Narrative   Lives w/ grandmother & daughter   Social Determinants of Health   Financial Resource Strain: Medium Risk  . Difficulty of Paying Living Expenses: Somewhat hard  Food Insecurity: Food Insecurity Present  . Worried About Charity fundraiser in the Last Year: Sometimes true  . Ran Out of Food in the Last Year: Sometimes true  Transportation Needs: No Transportation Needs  . Lack of Transportation (Medical): No  . Lack of Transportation (Non-Medical): No  Physical Activity: Sufficiently Active  . Days of Exercise per Week: 6 days  . Minutes of Exercise per Session: 90 min  Stress: No Stress Concern Present  . Feeling of Stress : Only a little  Social Connections: Moderately Isolated  . Frequency of Communication with Friends and Family: More than three times a week  . Frequency of Social Gatherings with Friends and Family: More than three times a week  . Attends Religious Services: 1 to 4 times per year  . Active Member of Clubs or Organizations: No  . Attends Archivist Meetings: Never  . Marital Status: Never married    Review of Systems: Gen: Denies fever, chills, anorexia. Denies fatigue, weakness, weight loss.  CV: Denies chest pain, palpitations, syncope, peripheral edema, and claudication. Resp: Denies dyspnea at rest, cough, wheezing, coughing up blood, and pleurisy. GI: see HPI Derm: Denies rash, itching, dry skin Psych: Denies depression, anxiety, memory loss, confusion. No homicidal or suicidal ideation.  Heme: Denies bruising, bleeding, and enlarged lymph nodes.  Physical Exam: BP 125/77   Pulse 64   Temp (!) 96.9 F (36.1 C)   Ht 5\' 4"  (1.626 m)   Wt 187 lb 12.8 oz (85.2 kg)   BMI 32.24 kg/m  General:   Alert and oriented. No distress noted. Pleasant and cooperative.  Head:  Normocephalic and atraumatic. Eyes:  Conjuctiva clear without scleral  icterus. Mouth:  Mask in place Cardiac: S1 S2 present without murmurs Lungs: clear bilaterally Abdomen:  +BS, soft, non-tender and non-distended. No rebound or guarding. No HSM or masses noted. Msk:  Symmetrical without gross deformities. Normal posture. Extremities:  Without edema. Neurologic:  Alert and  oriented x4 Psych:  Alert and cooperative. Normal mood and affect.  ASSESSMENT: Nicolas Sisler is a 50 y.o. female presenting today with a history of constipation, GERD,hemorrhoids s/p banding, and dysphagia since Oct 2021.  GERD well-managed with omeprazole BID. Dysphagia to solid foods; no pill dysphagia. Will pursue EGD/dilation as previously planned. Last EGD in 2011.  Constipation: doing well with increased dosage of Linzess at 290 mcg daily.   PLAN:  Proceed with upper endoscopy/dilation  by Dr. Gala Romney in near future: the risks, benefits,  and alternatives have been discussed with the patient in detail. The patient states understanding and desires to proceed.   Omeprazole BID  Continue Linzess 290 mcg   Return in 6 months   Annitta Needs, PhD, [facility] Gastroenterology

## 2020-10-16 NOTE — Patient Instructions (Signed)
We are arranging an upper endoscopy with dilation in the near future.  We will see you back in 6 months!  I enjoyed seeing you again today! As you know, I value our relationship and want to provide genuine, compassionate, and quality care. I welcome your feedback. If you receive a survey regarding your visit,  I greatly appreciate you taking time to fill this out. See you next time!  Annitta Needs, PhD, ANP-BC [facility] Gastroenterology

## 2020-11-24 ENCOUNTER — Telehealth: Payer: Self-pay | Admitting: Internal Medicine

## 2020-11-24 NOTE — Telephone Encounter (Signed)
Tried to call pt, LMOVM for return call. 

## 2020-11-24 NOTE — Telephone Encounter (Signed)
Called pt, EGD/DIL rescheduled to 12/10/20 at 2:00pm. COVID test changed to 12/08/20 at 1:30pm. Appt letter mailed with procedure instructions.

## 2020-11-24 NOTE — Telephone Encounter (Signed)
PLEASE CALL PATIENT, SHE NEEDS TO RESCHEDULE HER PROCEDURE

## 2020-11-26 ENCOUNTER — Other Ambulatory Visit (HOSPITAL_COMMUNITY): Payer: Medicaid Other

## 2020-12-08 ENCOUNTER — Other Ambulatory Visit (HOSPITAL_COMMUNITY)
Admission: RE | Admit: 2020-12-08 | Discharge: 2020-12-08 | Disposition: A | Discharge status: Home / Self Care | Payer: Medicaid Other | Source: Ambulatory Visit | Attending: Internal Medicine | Admitting: Internal Medicine

## 2020-12-08 ENCOUNTER — Other Ambulatory Visit: Payer: Self-pay

## 2020-12-08 ENCOUNTER — Other Ambulatory Visit (HOSPITAL_COMMUNITY): Payer: Self-pay | Admitting: *Deleted

## 2020-12-08 DIAGNOSIS — Z20822 Contact with and (suspected) exposure to covid-19: Secondary | ICD-10-CM | POA: Diagnosis not present

## 2020-12-08 DIAGNOSIS — Z01812 Encounter for preprocedural laboratory examination: Secondary | ICD-10-CM | POA: Diagnosis not present

## 2020-12-08 LAB — SARS CORONAVIRUS 2 (TAT 6-24 HRS): SARS Coronavirus 2: NEGATIVE

## 2020-12-10 ENCOUNTER — Encounter (HOSPITAL_COMMUNITY): Admission: RE | Payer: Self-pay | Source: Home / Self Care

## 2020-12-10 ENCOUNTER — Ambulatory Visit (HOSPITAL_COMMUNITY): Admission: RE | Admit: 2020-12-10 | Payer: Medicaid Other | Source: Home / Self Care | Admitting: Internal Medicine

## 2020-12-10 ENCOUNTER — Telehealth: Payer: Self-pay | Admitting: *Deleted

## 2020-12-10 SURGERY — EGD (ESOPHAGOGASTRODUODENOSCOPY)
Anesthesia: Moderate Sedation

## 2020-12-10 NOTE — Telephone Encounter (Signed)
Patient called in. She states she is supposed to be at the hospital today for her procedure but she isn't going to make it. She states her mom is at the store and she is waiting on her. Melanie in endo called also advised patient she could still be done if she could be there at 1pm but patient stated just reschedule her. I advised pt she has already been rescheduled before and will need OV to r/s. She is scheduled with Vicente Males in September.

## 2020-12-10 NOTE — OR Nursing (Signed)
Patient was scheduled to arrive at 1200 for procedure. Called patient and she stated she is waiting on her mama. I asked when her mother could bring her and patient stated, "I don't know, she's at the store." Informed patient that we needed her her by 1:00 in order for her procedure to be performed. Patient stated, "Just give me another date, I'm not going to come." Dr. Gala Romney notified and spoke with Mindy at the office.

## 2021-03-18 ENCOUNTER — Other Ambulatory Visit (HOSPITAL_COMMUNITY): Payer: Self-pay | Admitting: Internal Medicine

## 2021-03-18 DIAGNOSIS — Z1231 Encounter for screening mammogram for malignant neoplasm of breast: Secondary | ICD-10-CM

## 2021-04-01 ENCOUNTER — Other Ambulatory Visit (HOSPITAL_COMMUNITY)
Admission: RE | Admit: 2021-04-01 | Discharge: 2021-04-01 | Disposition: A | Discharge status: Home / Self Care | Payer: Medicaid Other | Source: Ambulatory Visit | Attending: Internal Medicine | Admitting: Internal Medicine

## 2021-04-01 ENCOUNTER — Ambulatory Visit (HOSPITAL_COMMUNITY)
Admission: RE | Admit: 2021-04-01 | Discharge: 2021-04-01 | Disposition: A | Discharge status: Home / Self Care | Payer: Medicaid Other | Source: Ambulatory Visit | Attending: Internal Medicine | Admitting: Internal Medicine

## 2021-04-01 ENCOUNTER — Other Ambulatory Visit: Payer: Self-pay

## 2021-04-01 DIAGNOSIS — R7303 Prediabetes: Secondary | ICD-10-CM | POA: Insufficient documentation

## 2021-04-01 DIAGNOSIS — Z0001 Encounter for general adult medical examination with abnormal findings: Secondary | ICD-10-CM | POA: Insufficient documentation

## 2021-04-01 DIAGNOSIS — E876 Hypokalemia: Secondary | ICD-10-CM | POA: Insufficient documentation

## 2021-04-01 DIAGNOSIS — Z1231 Encounter for screening mammogram for malignant neoplasm of breast: Secondary | ICD-10-CM | POA: Insufficient documentation

## 2021-04-01 LAB — CBC WITH DIFFERENTIAL/PLATELET
Abs Immature Granulocytes: 0.02 10*3/uL (ref 0.00–0.07)
Basophils Absolute: 0 10*3/uL (ref 0.0–0.1)
Basophils Relative: 1 %
Eosinophils Absolute: 0.2 10*3/uL (ref 0.0–0.5)
Eosinophils Relative: 4 %
HCT: 39.8 % (ref 36.0–46.0)
Hemoglobin: 13.1 g/dL (ref 12.0–15.0)
Immature Granulocytes: 0 %
Lymphocytes Relative: 35 %
Lymphs Abs: 1.6 10*3/uL (ref 0.7–4.0)
MCH: 30.8 pg (ref 26.0–34.0)
MCHC: 32.9 g/dL (ref 30.0–36.0)
MCV: 93.6 fL (ref 80.0–100.0)
Monocytes Absolute: 0.3 10*3/uL (ref 0.1–1.0)
Monocytes Relative: 7 %
Neutro Abs: 2.4 10*3/uL (ref 1.7–7.7)
Neutrophils Relative %: 53 %
Platelets: 303 10*3/uL (ref 150–400)
RBC: 4.25 MIL/uL (ref 3.87–5.11)
RDW: 13.2 % (ref 11.5–15.5)
WBC: 4.6 10*3/uL (ref 4.0–10.5)
nRBC: 0 % (ref 0.0–0.2)

## 2021-04-01 LAB — HEPATIC FUNCTION PANEL
ALT: 8 U/L (ref 0–44)
AST: 12 U/L — ABNORMAL LOW (ref 15–41)
Albumin: 3.4 g/dL — ABNORMAL LOW (ref 3.5–5.0)
Alkaline Phosphatase: 80 U/L (ref 38–126)
Bilirubin, Direct: 0.1 mg/dL (ref 0.0–0.2)
Indirect Bilirubin: 0.3 mg/dL (ref 0.3–0.9)
Total Bilirubin: 0.4 mg/dL (ref 0.3–1.2)
Total Protein: 6.6 g/dL (ref 6.5–8.1)

## 2021-04-01 LAB — BASIC METABOLIC PANEL
Anion gap: 7 (ref 5–15)
BUN: 10 mg/dL (ref 6–20)
CO2: 25 mmol/L (ref 22–32)
Calcium: 8.5 mg/dL — ABNORMAL LOW (ref 8.9–10.3)
Chloride: 107 mmol/L (ref 98–111)
Creatinine, Ser: 0.68 mg/dL (ref 0.44–1.00)
GFR, Estimated: >60 mL/min (ref >60)
Glucose, Bld: 94 mg/dL (ref 70–99)
Potassium: 3.3 mmol/L — ABNORMAL LOW (ref 3.5–5.1)
Sodium: 139 mmol/L (ref 135–145)

## 2021-04-01 LAB — LIPID PANEL
Cholesterol: 161 mg/dL (ref 0–200)
HDL: 55 mg/dL (ref >40)
LDL Cholesterol: 93 mg/dL (ref 0–99)
Total CHOL/HDL Ratio: 2.9 RATIO
Triglycerides: 67 mg/dL (ref <150)
VLDL: 13 mg/dL (ref 0–40)

## 2021-04-01 LAB — HEMOGLOBIN A1C
Hgb A1c MFr Bld: 6 % — ABNORMAL HIGH (ref 4.8–5.6)
Mean Plasma Glucose: 125.5 mg/dL

## 2021-04-21 ENCOUNTER — Ambulatory Visit (INDEPENDENT_AMBULATORY_CARE_PROVIDER_SITE_OTHER): Payer: Medicaid Other | Admitting: Gastroenterology

## 2021-04-21 ENCOUNTER — Encounter: Payer: Self-pay | Admitting: Gastroenterology

## 2021-04-21 ENCOUNTER — Other Ambulatory Visit: Payer: Self-pay

## 2021-04-21 VITALS — BP 118/82 | HR 69 | Temp 97.3°F | Ht 64.0 in | Wt 176.4 lb

## 2021-04-21 DIAGNOSIS — R1310 Dysphagia, unspecified: Secondary | ICD-10-CM | POA: Diagnosis not present

## 2021-04-21 DIAGNOSIS — K219 Gastro-esophageal reflux disease without esophagitis: Secondary | ICD-10-CM

## 2021-04-21 DIAGNOSIS — K5900 Constipation, unspecified: Secondary | ICD-10-CM

## 2021-04-21 MED ORDER — LUBIPROSTONE 24 MCG PO CAPS: 24.0000 ug | ORAL_CAPSULE | Freq: Two times a day (BID) | ORAL | 3 refills | Status: DC

## 2021-04-21 NOTE — Patient Instructions (Signed)
Instead of Linzess, let's try Amitiza. Take this twice a day with food (to avoid nausea). Take with breakfast and dinner. Let me know how this works for you!   We are arranging a follow-up for banding.  We are also arranging an upper endoscopy with dilation in the near future with Dr. Gala Romney.   I enjoyed seeing you again today! As you know, I value our relationship and want to provide genuine, compassionate, and quality care. I welcome your feedback. If you receive a survey regarding your visit,  I greatly appreciate you taking time to fill this out. See you next time!  Annitta Needs, PhD, ANP-BC [facility] Gastroenterology

## 2021-04-21 NOTE — Progress Notes (Signed)
Referring Provider: Rosita Fire, MD Primary Care Physician:  Rosita Fire, MD Primary GI: Dr. Gala Romney   Chief Complaint  Patient presents with   Gastroesophageal Reflux    Doing okay, swallowing improved   Constipation    Takins linzess mostly daily and BM's are daily    HPI:   Alyssa Aguilar is a 50 y.o. female presenting today with a history of  constipation, GERD, hemorrhoids, banding in 2018 X 2 sessions. Nov 2020-Jan 2021 underwent banding of all 3 columns and final was neutral position. In Oct 2021, she had noted new onset solid food dysphagia. Last EGD in 2011. EGD/dilation has been arranged several times but had to be canceled due to transportation. Now presenting in follow-up.    Feels food is stacking up. Solid food dysphagia. No abdominal pain. Omeprazole BID. GERD overall controlled with PPI BID. Linzess 290 mcg daily. Occasional straining. Not ideally managed. Some prolapsing tissue and scant blood. No weight loss or lack of appetite. Declining colonoscopy.   Past Medical History:  Diagnosis Date   GERD (gastroesophageal reflux disease)    Helicobacter pylori gastritis 2011   s/p treatment with Prevpac. ERADICATION DOCUMENTED VIA BREATH TEST   Hemorrhoids    IBS (irritable bowel syndrome)    Migraines    S/P colonoscopy March 2011   internal hemorrhoids   S/P endoscopy March 2011   antral erosions, chronic active gastritis, +H.pylori   Spina bifida occulta     Past Surgical History:  Procedure Laterality Date   CHOLECYSTECTOMY     COLONOSCOPY N/A 05/21/2015   QIO:NGEX canal hemorrhoids otherwise normal   DILATION AND CURETTAGE OF UTERUS     egd/tcs  09/2009   Rourk-H.Pylori gastritis (s/p tx), benign sb bx, small hh, hemorrhoids, normal TI   ENDOMETRIAL ABLATION     HEMORRHOID BANDING     TUBAL LIGATION      Current Outpatient Medications  Medication Sig Dispense Refill   AIMOVIG 140 MG/ML SOAJ Inject 140 mg into the skin every 28 (twenty-eight)  days.     EPINEPHrine 0.3 mg/0.3 mL IJ SOAJ injection Inject 0.3 mg into the muscle as needed for anaphylaxis.     linaclotide (LINZESS) 290 MCG CAPS capsule Take 1 capsule (290 mcg total) by mouth daily before breakfast. 30 minutes before breakfast 90 capsule 3   lubiprostone (AMITIZA) 24 MCG capsule Take 1 capsule (24 mcg total) by mouth 2 (two) times daily with a meal. 60 capsule 3   omeprazole (PRILOSEC) 20 MG capsule TAKE 1 CAPSULE BY MOUTH TWICE DAILY BEFORE A MEAL (Patient taking differently: Take 20 mg by mouth daily.) 180 capsule 3   No current facility-administered medications for this visit.    Allergies as of 04/21/2021 - Review Complete 04/21/2021  Allergen Reaction Noted   Bee venom Anaphylaxis 11/06/2011   Acetaminophen Swelling and Other (See Comments)    Codeine Other (See Comments)     Family History  Problem Relation Age of Onset   Colon cancer Maternal Grandmother    Cancer Maternal Grandmother        Breast Cancer   Breast cancer Maternal Grandmother    Hypertension Maternal Grandmother    Diabetes Maternal Grandmother    Heart disease Maternal Grandmother    Asthma Mother    Colon cancer Maternal Uncle     Social History   Socioeconomic History   Marital status: Single    Spouse name: Not on file   Number of children: 1  Years of education: Not on file   Highest education level: Not on file  Occupational History   Occupation: disabled    Comment: "spinal issues"  Tobacco Use   Smoking status: Every Day    Packs/day: 2.50    Years: 23.00    Pack years: 57.50    Types: Cigarettes   Smokeless tobacco: Never   Tobacco comments:    one pack a day  Vaping Use   Vaping Use: Never used  Substance and Sexual Activity   Alcohol use: No   Drug use: No   Sexual activity: Yes    Birth control/protection: Surgical  Other Topics Concern   Not on file  Social History Narrative   Lives w/ grandmother & daughter   Social Determinants of Health    Financial Resource Strain: Not on file  Food Insecurity: Not on file  Transportation Needs: Not on file  Physical Activity: Not on file  Stress: Not on file  Social Connections: Not on file    Review of Systems: Gen: Denies fever, chills, anorexia. Denies fatigue, weakness, weight loss.  CV: Denies chest pain, palpitations, syncope, peripheral edema, and claudication. Resp: Denies dyspnea at rest, cough, wheezing, coughing up blood, and pleurisy. GI: see HPI Derm: Denies rash, itching, dry skin Psych: Denies depression, anxiety, memory loss, confusion. No homicidal or suicidal ideation.  Heme: see HPI  Physical Exam: BP 118/82   Pulse 69   Temp (!) 97.3 F (36.3 C)   Ht 5\' 4"  (1.626 m)   Wt 176 lb 6.4 oz (80 kg)   BMI 30.28 kg/m  General:   Alert and oriented. No distress noted. Pleasant and cooperative.  Head:  Normocephalic and atraumatic. Eyes:  Conjuctiva clear without scleral icterus. Mouth:  mask in place Abdomen:  +BS, soft, non-tender and non-distended. No rebound or guarding. No HSM or masses noted. Rectal: no fissure or mass, DRE without mass. No prolapsing hemorrhoids.  Msk:  Symmetrical without gross deformities. Normal posture. Extremities:  Without edema. Neurologic:  Alert and  oriented x4 Psych:  Alert and cooperative. Normal mood and affect.  ASSESSMENT: Alyssa Aguilar is a 50 y.o. female presenting today with a history of  constipation, GERD, hemorrhoids with prior banding and last colonoscopy in 2016. Dysphagia onset in Oct 2021 but has had to cancel prior EGDs due to transportation.  Dysphagia: will arrange EGD/dilation in near future. Continues on PPI BID, which controls GERD.   Constipation: Linzess 290 mcg daily not ideally managing constipation. Low-volume hematochezia and symptomatic hemorrhoids noted. Last colonoscopy in 2016 but declining early interval evaluation. Will pursue banding in future as appropriate at another visit to be arranged.     PLAN:  Proceed with upper endoscopy /dilation with PROPOFOL by Dr. Gala Romney in near future: the risks, benefits, and alternatives have been discussed with the patient in detail. The patient states understanding and desires to proceed.   Continue Prilosec 20 mg BID  Stop Linzess. Trial of Amitiza 24 mcg po BID with food  Return in follow-up for banding   Annitta Needs, PhD, [facility] Gastroenterology

## 2021-04-27 ENCOUNTER — Encounter: Payer: Self-pay | Admitting: *Deleted

## 2021-05-12 ENCOUNTER — Telehealth: Payer: Self-pay

## 2021-05-12 NOTE — Telephone Encounter (Signed)
Called pt to schedule EGD/DIL w/Dr. Gala Romney conscious sedation. Pt said she has an appt 06/01/21, appt is for triage nurse visit.  Angie, would you be able to add EGD/DIL for dysphagia to your triage colonoscopy? Her OV was 04/21/21.

## 2021-05-12 NOTE — Telephone Encounter (Signed)
Sure thing!

## 2021-06-01 ENCOUNTER — Ambulatory Visit (INDEPENDENT_AMBULATORY_CARE_PROVIDER_SITE_OTHER): Payer: Self-pay | Admitting: *Deleted

## 2021-06-01 ENCOUNTER — Other Ambulatory Visit: Payer: Self-pay

## 2021-06-01 VITALS — Ht 64.0 in | Wt 182.6 lb

## 2021-06-01 DIAGNOSIS — K625 Hemorrhage of anus and rectum: Secondary | ICD-10-CM

## 2021-06-01 DIAGNOSIS — Z1211 Encounter for screening for malignant neoplasm of colon: Secondary | ICD-10-CM

## 2021-06-01 DIAGNOSIS — R1310 Dysphagia, unspecified: Secondary | ICD-10-CM

## 2021-06-01 NOTE — Progress Notes (Addendum)
Gastroenterology Pre-Procedure Review  Request Date: 06/01/2021 Requesting Physician: Dr.Fanta, Last TCS done 05/21/2015, normal colon, anal canal hemorrhoids, triage for EGD/DIL for dysphagia (per 10/11/222 telephone note)  PATIENT REVIEW QUESTIONS: The patient responded to the following health history questions as indicated:    1. Diabetes Melitis: no 2. Joint replacements in the past 12 months: no 3. Major health problems in the past 3 months: no 4. Has an artificial valve or MVP: no 5. Has a defibrillator: no 6. Has been advised in past to take antibiotics in advance of a procedure like teeth cleaning: no 7. Family history of colon cancer: no  8. Alcohol Use: no 9. Illicit drug Use: no 10. History of sleep apnea: no  11. History of coronary artery or other vascular stents placed within the last 12 months: no 12. History of any prior anesthesia complications: no 13. Body mass index is 31.34 kg/m.    MEDICATIONS & ALLERGIES:    Patient reports the following regarding taking any blood thinners:   Plavix? no Aspirin? no Coumadin? no Brilinta? no Xarelto? no Eliquis? no Pradaxa? no Savaysa? no Effient? no  Patient confirms/reports the following medications:  Current Outpatient Medications  Medication Sig Dispense Refill   AIMOVIG 140 MG/ML SOAJ Inject 140 mg into the skin every 28 (twenty-eight) days.     Aspirin-Acetaminophen-Caffeine (GOODY HEADACHE PO) Take by mouth as needed.     EPINEPHrine 0.3 mg/0.3 mL IJ SOAJ injection Inject 0.3 mg into the muscle as needed for anaphylaxis.     lubiprostone (AMITIZA) 24 MCG capsule Take 1 capsule (24 mcg total) by mouth 2 (two) times daily with a meal. (Patient taking differently: Take 24 mcg by mouth daily.) 60 capsule 3   omeprazole (PRILOSEC) 20 MG capsule TAKE 1 CAPSULE BY MOUTH TWICE DAILY BEFORE A MEAL (Patient taking differently: Take 20 mg by mouth daily.) 180 capsule 3   No current facility-administered medications for this  visit.    Patient confirms/reports the following allergies:  Allergies  Allergen Reactions   Bee Venom Anaphylaxis   Acetaminophen Swelling and Other (See Comments)    Tylenol #3   Codeine Other (See Comments)    Patient doesn't like. Allergy to Tylenol #3    No orders of the defined types were placed in this encounter.   AUTHORIZATION INFORMATION Primary Insurance: Medicaid [place] Access,  ID #: [id number] N Pre-Cert / Josem Kaufmann required: No, not required  SCHEDULE INFORMATION: Procedure has been scheduled as follows:  Date: 07/22/2021, Time: 10:00 Location: [facility] with Dr. Gala Romney  This Gastroenterology Pre-Precedure Review Form is being routed to the following provider(s):  Neil Crouch, PA-C

## 2021-06-01 NOTE — Progress Notes (Signed)
Pt aware that I will call her once Dec procedure schedules are available.

## 2021-06-03 NOTE — Progress Notes (Signed)
Ok to schedule for TCS/EGD/ED with Dr. Gala Romney with conscious sedation. ASA II. She has tolerated conscious sedation in the past and no use of antidepressants/anxiolytics, etoh, drugs. Recent OV dictation stated EGD with propofol but that was typo (order sheet said conscious).   Dx: esophageal dilation, h/o rectal bleeding  Just make sure patient aware that she is not due for screening colonoscopy but Vicente Males had suggested her consider colonoscopy due to rectal bleeding.

## 2021-06-05 ENCOUNTER — Encounter: Payer: Self-pay | Admitting: *Deleted

## 2021-06-05 MED ORDER — PEG 3350-KCL-NA BICARB-NACL 420 G PO SOLR: 4000.0000 mL | Freq: Once | ORAL | 0 refills | Status: AC

## 2021-06-05 NOTE — Progress Notes (Signed)
Spoke to pt.  Scheduled procedure for 07/22/2021 at 10:00 with arrival at 9:00.  Confirmed mailing address with pt. Mailed prep instructions to pt.

## 2021-06-05 NOTE — Addendum Note (Signed)
Addended by: Metro Kung on: 06/05/2021 09:57 AM   Modules accepted: Orders

## 2021-07-15 ENCOUNTER — Telehealth: Payer: Self-pay | Admitting: Internal Medicine

## 2021-07-15 NOTE — Telephone Encounter (Signed)
[phone] please call patient she needs to reschedule her procedure

## 2021-07-15 NOTE — Telephone Encounter (Addendum)
Spoke to pt.  Made her aware that I would cancel her procedure for 07/22/2021.  Informed her that I would call her once Jan procedure schedules have been released.  Pt voiced understanding.  Informed Carolyn in Endo.

## 2021-07-16 ENCOUNTER — Encounter: Payer: Self-pay | Admitting: *Deleted

## 2021-07-16 NOTE — Telephone Encounter (Signed)
Spoke to pt.  Scheduled procedure for 08/12/2021 with arrival at 11:00.  Pt made aware that I am mailing out new prep instructions.

## 2021-07-16 NOTE — Telephone Encounter (Signed)
Noted  

## 2021-07-22 ENCOUNTER — Ambulatory Visit (HOSPITAL_COMMUNITY): Admission: RE | Admit: 2021-07-22 | Payer: Medicaid Other | Source: Home / Self Care | Admitting: Internal Medicine

## 2021-07-22 ENCOUNTER — Encounter (HOSPITAL_COMMUNITY): Admission: RE | Payer: Self-pay | Source: Home / Self Care

## 2021-07-22 SURGERY — COLONOSCOPY
Anesthesia: Moderate Sedation

## 2021-08-11 ENCOUNTER — Encounter: Payer: Self-pay | Admitting: *Deleted

## 2021-08-11 ENCOUNTER — Telehealth: Payer: Self-pay | Admitting: *Deleted

## 2021-08-11 NOTE — Telephone Encounter (Addendum)
Pt came by office.  She ate yesterday and today so had to reschedule her procedure.  Reviewed medical hx and medication list with pt.  No changes.  Scheduled pt for procedure 08/19/2021 with arrival at 1:00.  Called and left voice message for Yettem in Endo.  Reviewed all new prep instructions with pt.  She voiced understanding.

## 2021-08-13 ENCOUNTER — Encounter: Payer: Self-pay | Admitting: *Deleted

## 2021-08-13 ENCOUNTER — Telehealth: Payer: Self-pay | Admitting: *Deleted

## 2021-08-13 NOTE — Telephone Encounter (Signed)
Dr. Gala Romney will not be doing procedures the evening of 08/19/2021.  Informed pt.  Rescheduled procedure to 08/26/2021 with arrival at 9:30.  Pt made aware that I will mail out new prep instructions.  Confirmed mailing address.  Lmom for Kim in Endo.

## 2021-08-19 ENCOUNTER — Encounter (HOSPITAL_COMMUNITY): Payer: Self-pay

## 2021-08-19 ENCOUNTER — Emergency Department (HOSPITAL_COMMUNITY): Payer: Medicaid Other

## 2021-08-19 ENCOUNTER — Other Ambulatory Visit: Payer: Self-pay

## 2021-08-19 ENCOUNTER — Emergency Department (HOSPITAL_COMMUNITY)
Admission: EM | Admit: 2021-08-19 | Discharge: 2021-08-19 | Disposition: A | Discharge status: Home / Self Care | Payer: Medicaid Other | Attending: Emergency Medicine | Admitting: Emergency Medicine

## 2021-08-19 DIAGNOSIS — S6992XA Unspecified injury of left wrist, hand and finger(s), initial encounter: Secondary | ICD-10-CM | POA: Diagnosis present

## 2021-08-19 DIAGNOSIS — S63502A Unspecified sprain of left wrist, initial encounter: Secondary | ICD-10-CM

## 2021-08-19 DIAGNOSIS — W1839XA Other fall on same level, initial encounter: Secondary | ICD-10-CM | POA: Insufficient documentation

## 2021-08-19 DIAGNOSIS — Z7982 Long term (current) use of aspirin: Secondary | ICD-10-CM | POA: Diagnosis not present

## 2021-08-19 DIAGNOSIS — S6392XA Sprain of unspecified part of left wrist and hand, initial encounter: Secondary | ICD-10-CM | POA: Diagnosis not present

## 2021-08-19 NOTE — Discharge Instructions (Addendum)
Please refer to the attached instructions. Tylenol or ibuprofen for discomfort. Follow-up with your primary care provider or orthopedics if no improvement.

## 2021-08-19 NOTE — ED Provider Notes (Signed)
[facility] EMERGENCY DEPARTMENT Provider Note   CSN: [id number] Arrival date & time: 08/19/21  1821     History  Chief Complaint  Patient presents with   Wrist Pain    Alyssa Aguilar is a 51 y.o. female.  Patient fell onto extended left wrist today. Mechanical fall. Patient complaining of pain to the distal aspect of the left radius, with mild swelling noted. Sensation and movement intact. Brisk capillary refill. No other injury from fall.  The history is provided by the patient. No language interpreter was used.  Wrist Pain This is a new problem. The current episode started 1 to 2 hours ago. The problem has not changed since onset.She has tried rest for the symptoms. The treatment provided mild relief.      Home Medications Prior to Admission medications   Medication Sig Start Date End Date Taking? Authorizing Provider  AIMOVIG 140 MG/ML SOAJ Inject 140 mg into the skin every 28 (twenty-eight) days. 03/11/20   [provider]  Aspirin-Acetaminophen-Caffeine (GOODY HEADACHE PO) Take by mouth as needed.    [provider]  EPINEPHrine 0.3 mg/0.3 mL IJ SOAJ injection Inject 0.3 mg into the muscle as needed for anaphylaxis.    [provider]  lubiprostone (AMITIZA) 24 MCG capsule Take 1 capsule (24 mcg total) by mouth 2 (two) times daily with a meal. Patient taking differently: Take 24 mcg by mouth daily. 04/21/21   Annitta Needs, NP  omeprazole (PRILOSEC) 20 MG capsule TAKE 1 CAPSULE BY MOUTH TWICE DAILY BEFORE A MEAL Patient taking differently: Take 20 mg by mouth daily. 09/08/20   Annitta Needs, NP      Allergies    Bee venom, Acetaminophen, and Codeine    Review of Systems   Review of Systems  All other systems reviewed and are negative.  Physical Exam Updated Vital Signs BP (!) 131/96 (BP Location: Right Arm)    Pulse 82    Temp 98 F (36.7 C) (Oral)    Resp 14    Ht 5\' 4"  (1.626 m)    Wt 81.2 kg    SpO2 100%    BMI 30.73 kg/m  Physical  Exam HENT:     Head: Atraumatic.     Nose: Nose normal.  Eyes:     Conjunctiva/sclera: Conjunctivae normal.  Cardiovascular:     Rate and Rhythm: Normal rate.  Pulmonary:     Effort: Pulmonary effort is normal.  Musculoskeletal:        General: Swelling, tenderness and signs of injury present.     Cervical back: Normal range of motion.  Skin:    General: Skin is warm and dry.  Neurological:     Mental Status: She is alert and oriented to person, place, and time.  Psychiatric:        Mood and Affect: Mood normal.        Behavior: Behavior normal.    ED Results / Procedures / Treatments   Labs (all labs ordered are listed, but only abnormal results are displayed) Labs Reviewed - No data to display  EKG None  Radiology DG Wrist Complete Left  Result Date: 08/19/2021 CLINICAL DATA:  fall EXAM: LEFT WRIST - COMPLETE 3+ VIEW COMPARISON:  None. FINDINGS: There is no evidence of fracture or dislocation. There is no evidence of arthropathy or other focal bone abnormality. Soft tissues are unremarkable. IMPRESSION: No acute displaced fracture or dislocation. Electronically Signed   By: Iven Finn M.D.   On:  08/19/2021 18:58    Procedures Procedures    Medications Ordered in ED Medications - No data to display  ED Course/ Medical Decision Making/ A&P                           Medical Decision Making Amount and/or Complexity of Data Reviewed Radiology: ordered.  Radiology results reviewed, no acute findings.   Patient X-Ray negative for obvious fracture or dislocation.  Pt advised to follow up with PCP or orthopedics. Patient given splint while in ED, conservative therapy recommended and discussed. Patient will be discharged home & is agreeable with above plan. Returns precautions discussed. Pt appears safe for discharge.         Final Clinical Impression(s) / ED Diagnoses Final diagnoses:  Sprain of left wrist, initial encounter    Rx / DC Orders ED  Discharge Orders     None         Etta Quill, NP 08/19/21 2230    Lajean Saver, MD 08/19/21 2257

## 2021-08-19 NOTE — ED Triage Notes (Signed)
Pt presents to ED with complaints of left wrist pain after fall today.

## 2021-08-26 ENCOUNTER — Ambulatory Visit (HOSPITAL_COMMUNITY)
Admission: RE | Admit: 2021-08-26 | Discharge: 2021-08-26 | Disposition: A | Discharge status: Home / Self Care | Payer: Medicaid Other | Attending: Internal Medicine | Admitting: Internal Medicine

## 2021-08-26 ENCOUNTER — Encounter (HOSPITAL_COMMUNITY): Payer: Self-pay | Admitting: Internal Medicine

## 2021-08-26 ENCOUNTER — Encounter (HOSPITAL_COMMUNITY)
Admission: RE | Disposition: A | Discharge status: Home / Self Care | Payer: Self-pay | Source: Home / Self Care | Attending: Internal Medicine

## 2021-08-26 ENCOUNTER — Other Ambulatory Visit: Payer: Self-pay

## 2021-08-26 DIAGNOSIS — K319 Disease of stomach and duodenum, unspecified: Secondary | ICD-10-CM | POA: Diagnosis not present

## 2021-08-26 DIAGNOSIS — R1314 Dysphagia, pharyngoesophageal phase: Secondary | ICD-10-CM | POA: Diagnosis not present

## 2021-08-26 DIAGNOSIS — K449 Diaphragmatic hernia without obstruction or gangrene: Secondary | ICD-10-CM | POA: Insufficient documentation

## 2021-08-26 DIAGNOSIS — K259 Gastric ulcer, unspecified as acute or chronic, without hemorrhage or perforation: Secondary | ICD-10-CM

## 2021-08-26 DIAGNOSIS — F17210 Nicotine dependence, cigarettes, uncomplicated: Secondary | ICD-10-CM | POA: Diagnosis not present

## 2021-08-26 DIAGNOSIS — R1310 Dysphagia, unspecified: Secondary | ICD-10-CM | POA: Diagnosis not present

## 2021-08-26 DIAGNOSIS — K254 Chronic or unspecified gastric ulcer with hemorrhage: Secondary | ICD-10-CM | POA: Diagnosis not present

## 2021-08-26 DIAGNOSIS — D124 Benign neoplasm of descending colon: Secondary | ICD-10-CM | POA: Insufficient documentation

## 2021-08-26 DIAGNOSIS — K921 Melena: Secondary | ICD-10-CM | POA: Diagnosis present

## 2021-08-26 HISTORY — PX: ESOPHAGOGASTRODUODENOSCOPY: SHX5428

## 2021-08-26 HISTORY — PX: COLONOSCOPY: SHX5424

## 2021-08-26 HISTORY — PX: MALONEY DILATION: SHX5535

## 2021-08-26 SURGERY — EGD (ESOPHAGOGASTRODUODENOSCOPY)
Anesthesia: Moderate Sedation

## 2021-08-26 MED ORDER — MEPERIDINE HCL 100 MG/ML IJ SOLN
INTRAMUSCULAR | Status: DC | PRN
  Administered 2021-08-26: 10 mg via INTRAVENOUS
  Administered 2021-08-26: 40 mg via INTRAVENOUS

## 2021-08-26 MED ORDER — LIDOCAINE VISCOUS HCL 2 % MT SOLN
OROMUCOSAL | Status: AC
  Filled 2021-08-26: qty 15

## 2021-08-26 MED ORDER — MEPERIDINE HCL 50 MG/ML IJ SOLN
INTRAMUSCULAR | Status: AC
  Filled 2021-08-26: qty 1

## 2021-08-26 MED ORDER — ONDANSETRON HCL 4 MG/2ML IJ SOLN
INTRAMUSCULAR | Status: AC
  Filled 2021-08-26: qty 2

## 2021-08-26 MED ORDER — LIDOCAINE VISCOUS HCL 2 % MT SOLN
OROMUCOSAL | Status: DC | PRN
  Administered 2021-08-26: 1 via OROMUCOSAL

## 2021-08-26 MED ORDER — MIDAZOLAM HCL 5 MG/5ML IJ SOLN
INTRAMUSCULAR | Status: AC
  Filled 2021-08-26: qty 10

## 2021-08-26 MED ORDER — MIDAZOLAM HCL 5 MG/5ML IJ SOLN
INTRAMUSCULAR | Status: DC | PRN
Start: 2021-08-26 — End: 2021-08-26
  Administered 2021-08-26: 2 mg via INTRAVENOUS
  Administered 2021-08-26 (×2): 1 mg via INTRAVENOUS
  Administered 2021-08-26: 2 mg via INTRAVENOUS

## 2021-08-26 MED ORDER — ONDANSETRON HCL 4 MG/2ML IJ SOLN
INTRAMUSCULAR | Status: DC | PRN
  Administered 2021-08-26: 4 mg via INTRAVENOUS

## 2021-08-26 MED ORDER — SODIUM CHLORIDE 0.9 % IV SOLN
INTRAVENOUS | Status: DC
  Administered 2021-08-26: 11:00:00 1000 mL via INTRAVENOUS

## 2021-08-26 NOTE — Discharge Instructions (Signed)
Colonoscopy Discharge Instructions  Read the instructions outlined below and refer to this sheet in the next few weeks. These discharge instructions provide you with general information on caring for yourself after you leave the hospital. Your doctor may also give you specific instructions. While your treatment has been planned according to the most current medical practices available, unavoidable complications occasionally occur. If you have any problems or questions after discharge, call Dr. Gala Romney at [phone]. ACTIVITY You may resume your regular activity, but move at a slower pace for the next 24 hours.  Take frequent rest periods for the next 24 hours.  Walking will help get rid of the air and reduce the bloated feeling in your belly (abdomen).  No driving for 24 hours (because of the medicine (anesthesia) used during the test).   Do not sign any important legal documents or operate any machinery for 24 hours (because of the anesthesia used during the test).  NUTRITION Drink plenty of fluids.  You may resume your normal diet as instructed by your doctor.  Begin with a light meal and progress to your normal diet. Heavy or fried foods are harder to digest and may make you feel sick to your stomach (nauseated).  Avoid alcoholic beverages for 24 hours or as instructed.  MEDICATIONS You may resume your normal medications unless your doctor tells you otherwise.  WHAT YOU CAN EXPECT TODAY Some feelings of bloating in the abdomen.  Passage of more gas than usual.  Spotting of blood in your stool or on the toilet paper.  IF YOU HAD POLYPS REMOVED DURING THE COLONOSCOPY: No aspirin products for 7 days or as instructed.  No alcohol for 7 days or as instructed.  Eat a soft diet for the next 24 hours.  FINDING OUT THE RESULTS OF YOUR TEST Not all test results are available during your visit. If your test results are not back during the visit, make an appointment with your caregiver to find out the  results. Do not assume everything is normal if you have not heard from your caregiver or the medical facility. It is important for you to follow up on all of your test results.  SEEK IMMEDIATE MEDICAL ATTENTION IF: You have more than a spotting of blood in your stool.  Your belly is swollen (abdominal distention).  You are nauseated or vomiting.  You have a temperature over 101.  You have abdominal pain or discomfort that is severe or gets worse throughout the day.   EGD Discharge instructions Please read the instructions outlined below and refer to this sheet in the next few weeks. These discharge instructions provide you with general information on caring for yourself after you leave the hospital. Your doctor may also give you specific instructions. While your treatment has been planned according to the most current medical practices available, unavoidable complications occasionally occur. If you have any problems or questions after discharge, please call your doctor. ACTIVITY You may resume your regular activity but move at a slower pace for the next 24 hours.  Take frequent rest periods for the next 24 hours.  Walking will help expel (get rid of) the air and reduce the bloated feeling in your abdomen.  No driving for 24 hours (because of the anesthesia (medicine) used during the test).  You may shower.  Do not sign any important legal documents or operate any machinery for 24 hours (because of the anesthesia used during the test).  NUTRITION Drink plenty of fluids.  You may  resume your normal diet.  Begin with a light meal and progress to your normal diet.  Avoid alcoholic beverages for 24 hours or as instructed by your caregiver.  MEDICATIONS You may resume your normal medications unless your caregiver tells you otherwise.  WHAT YOU CAN EXPECT TODAY You may experience abdominal discomfort such as a feeling of fullness or gas pains.  FOLLOW-UP Your doctor will discuss the results of  your test with you.  SEEK IMMEDIATE MEDICAL ATTENTION IF ANY OF THE FOLLOWING OCCUR: Excessive nausea (feeling sick to your stomach) and/or vomiting.  Severe abdominal pain and distention (swelling).  Trouble swallowing.  Temperature over 101 F (37.8 C).  Rectal bleeding or vomiting of blood.     Your esophagus was stretched today   You had 1 small gastric ulcer.  Biopsies taken.  2 polyps in your colon removed today  Stop omeprazole; begin Protonix 40 mg twice daily-new prescription provided  Office visit with [place] in 3 months  Further recommendations to follow pending review of pathology report  At patient request, called Jasmine at [phone]-reviewed findings and recommendations

## 2021-08-26 NOTE — H&P (Signed)
@LOGO @   Primary Care Physician:  Carrolyn Meiers, MD Primary Gastroenterologist:  Dr. Gala Romney  Pre-Procedure History & Physical: HPI:  Alyssa Aguilar is a 51 y.o. female here for further evaluation of chronic esophageal dysphagia via EGD with possible dilation.  Also, hematochezia; patient slated for colonoscopy today.  Question of prep she did not complete all of it.  Was passing some formed elements but now is running clear per short stay staff.  Past Medical History:  Diagnosis Date   GERD (gastroesophageal reflux disease)    Helicobacter pylori gastritis 2011   s/p treatment with Prevpac. ERADICATION DOCUMENTED VIA BREATH TEST   Hemorrhoids    IBS (irritable bowel syndrome)    Migraines    S/P colonoscopy March 2011   internal hemorrhoids   S/P endoscopy March 2011   antral erosions, chronic active gastritis, +H.pylori   Spina bifida occulta     Past Surgical History:  Procedure Laterality Date   CHOLECYSTECTOMY     COLONOSCOPY N/A 05/21/2015   [facility]:VQQV canal hemorrhoids otherwise normal   DILATION AND CURETTAGE OF UTERUS     egd/tcs  09/2009   Dajah Fischman-H.Pylori gastritis (s/p tx), benign sb bx, small hh, hemorrhoids, normal TI   ENDOMETRIAL ABLATION     HEMORRHOID BANDING     TUBAL LIGATION      Prior to Admission medications   Medication Sig Start Date End Date Taking? Authorizing Provider  AIMOVIG 140 MG/ML SOAJ Inject 140 mg into the skin every 28 (twenty-eight) days. 03/11/20  Yes [provider]  Aspirin-Acetaminophen-Caffeine (GOODY HEADACHE PO) Take 1 packet by mouth as needed (pain).   Yes [provider]  EPINEPHrine 0.3 mg/0.3 mL IJ SOAJ injection Inject 0.3 mg into the muscle as needed for anaphylaxis.   Yes [provider]  omeprazole (PRILOSEC) 20 MG capsule TAKE 1 CAPSULE BY MOUTH TWICE DAILY BEFORE A MEAL Patient taking differently: Take 20 mg by mouth daily. 09/08/20  Yes Annitta Needs, NP  albuterol (VENTOLIN HFA) 108 (90  Base) MCG/ACT inhaler Inhale 2 puffs into the lungs every 6 (six) hours as needed for wheezing or shortness of breath.    [provider]  lubiprostone (AMITIZA) 24 MCG capsule Take 1 capsule (24 mcg total) by mouth 2 (two) times daily with a meal. Patient not taking: Reported on 08/25/2021 04/21/21   Annitta Needs, NP    Allergies as of 07/16/2021 - Review Complete 06/01/2021  Allergen Reaction Noted   Bee venom Anaphylaxis 11/06/2011   Acetaminophen Swelling and Other (See Comments)    Codeine Other (See Comments)     Family History  Problem Relation Age of Onset   Colon cancer Maternal Grandmother    Cancer Maternal Grandmother        Breast Cancer   Breast cancer Maternal Grandmother    Hypertension Maternal Grandmother    Diabetes Maternal Grandmother    Heart disease Maternal Grandmother    Asthma Mother    Colon cancer Maternal Uncle     Social History   Socioeconomic History   Marital status: Single    Spouse name: Not on file   Number of children: 1   Years of education: Not on file   Highest education level: Not on file  Occupational History   Occupation: disabled    Comment: "spinal issues"  Tobacco Use   Smoking status: Every Day    Packs/day: 2.50    Years: 23.00    Pack years: 57.50  Types: Cigarettes   Smokeless tobacco: Never   Tobacco comments:    one pack a day  Vaping Use   Vaping Use: Never used  Substance and Sexual Activity   Alcohol use: No   Drug use: No   Sexual activity: Yes    Birth control/protection: Surgical  Other Topics Concern   Not on file  Social History Narrative   Lives w/ grandmother & daughter   Social Determinants of Health   Financial Resource Strain: Not on file  Food Insecurity: Not on file  Transportation Needs: Not on file  Physical Activity: Not on file  Stress: Not on file  Social Connections: Not on file  Intimate Partner Violence: Not on file    Review of Systems: See HPI, otherwise negative  ROS  Physical Exam: BP 116/82    Pulse 65    Temp 97.7 F (36.5 C) (Oral)    Resp 18    Ht 5\' 4"  (1.626 m)    Wt 81.6 kg    SpO2 98%    BMI 30.90 kg/m  General:   Alert,  Well-developed, well-nourished, pleasant and cooperative in NAD Neck:  Supple; no masses or thyromegaly. No significant cervical adenopathy. Lungs:  Clear throughout to auscultation.   No wheezes, crackles, or rhonchi. No acute distress. Heart:  Regular rate and rhythm; no murmurs, clicks, rubs,  or gallops. Abdomen: Non-distended, normal bowel sounds.  Soft and nontender without appreciable mass or hepatosplenomegaly.  Pulses:  Normal pulses noted. Extremities:  Without clubbing or edema.  Impression/Plan: 51 year old lady with chronic GERD and esophageal dysphagia to solids here for EGD.  Also, paper hematochezia; here for diagnostic colonoscopy.  Have offered patient both an EGD and a colonoscopy for plan.  The risks, benefits, limitations, imponderables and alternatives regarding both EGD and colonoscopy have been reviewed with the patient. Questions have been answered. All parties agreeable.       Notice: This dictation was prepared with Dragon dictation along with smaller phrase technology. Any transcriptional errors that result from this process are unintentional and may not be corrected upon review.

## 2021-08-26 NOTE — Op Note (Signed)
[facility] Patient Name: Alyssa Aguilar Procedure Date: 08/26/2021 11:18 AM MRN: [id number] Date of Birth: 1971/02/27 Attending MD: Norvel Richards , MD CSN: [id number] Age: 51 Admit Type: Outpatient Procedure:                Colonoscopy Indications:              Hematochezia Providers:                Norvel Richards, MD, Lurline Del, RN, Nelma Rothman, Technician Referring MD:              Medicines:                Midazolam 6 mg IV, Meperidine 50 mg IV Complications:            No immediate complications. Estimated Blood Loss:     Estimated blood loss was minimal. Procedure:                Pre-Anesthesia Assessment:                           - Prior to the procedure, a History and Physical                            was performed, and patient medications and                            allergies were reviewed. The patient's tolerance of                            previous anesthesia was also reviewed. The risks                            and benefits of the procedure and the sedation                            options and risks were discussed with the patient.                            All questions were answered, and informed consent                            was obtained. Prior Anticoagulants: The patient has                            taken no previous anticoagulant or antiplatelet                            agents. ASA Grade Assessment: II - A patient with                            mild systemic disease. After reviewing the risks  and benefits, the patient was deemed in                            satisfactory condition to undergo the procedure.                           After obtaining informed consent, the colonoscope                            was passed under direct vision. Throughout the                            procedure, the patient's blood pressure, pulse, and                            oxygen saturations  were monitored continuously. The                            [id number]) scope was introduced through the                            anus and advanced to the the cecum, identified by                            appendiceal orifice and ileocecal valve. The                            colonoscopy was performed without difficulty. The                            patient tolerated the procedure well. The quality                            of the bowel preparation was adequate. Scope In: 11:26:13 AM Scope Out: 11:36:51 AM Scope Withdrawal Time: 0 hours 8 minutes 34 seconds  Total Procedure Duration: 0 hours 10 minutes 38 seconds  Findings:      The perianal and digital rectal examinations were normal.      Two sessile polyps were found in the descending colon. The polyps were 3       to 5 mm in size. These polyps were removed with a cold snare. Resection       and retrieval were complete. Estimated blood loss was minimal.      The exam was otherwise without abnormality on direct and retroflexion       views. Impression:               - Two 3 to 5 mm polyps in the descending colon,                            removed with a cold snare. Resected and retrieved.                           - The examination was otherwise normal on direct  and retroflexion views. Moderate Sedation:      Moderate (conscious) sedation was administered by the endoscopy nurse       and supervised by the endoscopist. The following parameters were       monitored: oxygen saturation, heart rate, blood pressure, respiratory       rate, EKG, adequacy of pulmonary ventilation, and response to care.       Total physician intraservice time was 40 minutes. Recommendation:           - Patient has a contact number available for                            emergencies. The signs and symptoms of potential                            delayed complications were discussed with the                             patient. Return to normal activities tomorrow.                            Written discharge instructions were provided to the                            patient.                           - Advance diet as tolerated.                           - Continue present medications.                           - Repeat colonoscopy date to be determined after                            pending pathology results are reviewed for                            surveillance.                           - Return to GI office in 3 months. Follow-up on                            pathology. See EGD report Procedure Code(s):        --- Professional ---                           [phone], Colonoscopy, flexible; with removal of                            tumor(s), polyp(s), or other lesion(s) by snare                            technique  61443, Moderate sedation; each additional 15                            minutes intraservice time                           [id number], Moderate sedation; each additional 15                            minutes intraservice time                           G0500, Moderate sedation services provided by the                            same physician or other qualified health care                            professional performing a gastrointestinal                            endoscopic service that sedation supports,                            requiring the presence of an independent trained                            observer to assist in the monitoring of the                            patient's level of consciousness and physiological                            status; initial 15 minutes of intra-service time;                            patient age 2 years or older (additional time may                            be reported with [phone], as appropriate) Diagnosis Code(s):        --- Professional ---                           K63.5, Polyp of colon                            K92.1, Melena (includes Hematochezia) CPT copyright 2019 [employer]. All rights reserved. The codes documented in this report are preliminary and upon coder review may  be revised to meet current compliance requirements. Cristopher Estimable. Ettamae Barkett, MD Norvel Richards, MD 08/26/2021 11:44:39 AM This report has been signed electronically. Number of Addenda: 0

## 2021-08-26 NOTE — Op Note (Signed)
[facility] Patient Name: Alyssa Aguilar Procedure Date: 08/26/2021 10:24 AM MRN: [id number] Date of Birth: 1970-10-30 Attending MD: Norvel Richards , MD CSN: [id number] Age: 51 Admit Type: Outpatient Procedure:                Upper GI endoscopy Indications:              Dysphagia Providers:                Norvel Richards, MD, Lurline Del, RN, Nelma Rothman, Technician Referring MD:              Medicines:                Midazolam 5 mg IV, Meperidine 50 mg IV Complications:            No immediate complications. Estimated Blood Loss:     Estimated blood loss was minimal. Estimated blood                            loss was minimal. Procedure:                Pre-Anesthesia Assessment:                           - Prior to the procedure, a History and Physical                            was performed, and patient medications and                            allergies were reviewed. The patient's tolerance of                            previous anesthesia was also reviewed. The risks                            and benefits of the procedure and the sedation                            options and risks were discussed with the patient.                            All questions were answered, and informed consent                            was obtained. Prior Anticoagulants: The patient has                            taken no previous anticoagulant or antiplatelet                            agents. ASA Grade Assessment: II - A patient with  mild systemic disease. After reviewing the risks                            and benefits, the patient was deemed in                            satisfactory condition to undergo the procedure.                           After obtaining informed consent, the endoscope was                            passed under direct vision. Throughout the                            procedure, the patient's blood  pressure, pulse, and                            oxygen saturations were monitored continuously. The                            GIF-H190 ([id number]) scope was introduced through the                            mouth, and advanced to the second part of duodenum.                            The upper GI endoscopy was accomplished without                            difficulty. The patient tolerated the procedure                            well. Scope In: 11:08:42 AM Scope Out: 11:17:26 AM Total Procedure Duration: 0 hours 8 minutes 44 seconds  Findings:      A small hiatal hernia was present. The esophagus appeared normal. Widely       patent throughout its course.      One non-bleeding cratered gastric ulcer -4 mm; cratered; clean-based.       Benign in appearance. Otherwise, the gastric mucosa appeared normal.       Patent pylorus.      The duodenal bulb and second portion of the duodenum were normal. Scope       withdrawn. A 54 French Maloney dilator was passed to full insertion with       mild resistance. A look back revealed no apparent complication with this       maneuver. Slight amount of blood at the UES. Finally, the prepyloric       antral ulcer was biopsied. Impression:               - Small hiatal hernia. Normal esophagusstatus post                            esophageal dilation as described                           -  Non-bleeding gastric ulcer with no stigmata of                            bleeding. Status post gastric ulcer biopsy                           - Normal duodenal bulb and second portion of the                            duodenum. Moderate Sedation:      Moderate (conscious) sedation was administered by the endoscopy nurse       and supervised by the endoscopist. The following parameters were       monitored: oxygen saturation, heart rate, blood pressure, and response       to care. Recommendation:           - Patient has a contact number available for                             emergencies. The signs and symptoms of potential                            delayed complications were discussed with the                            patient. Return to normal activities tomorrow.                            Written discharge instructions were provided to the                            patient.                           - Resume previous diet.                           - Continue present medications. Stop omeprazole;                            begin Protonix 40 mg orally twice daily x12 weeks.                            Follow-up on pathology. Office visit with [place] in 3                            months. See colonoscopy report. Procedure Code(s):        --- Professional ---                           [phone], Esophagogastroduodenoscopy, flexible,                            transoral; diagnostic, including collection of  specimen(s) by brushing or washing, when performed                            (separate procedure) Diagnosis Code(s):        --- Professional ---                           K44.9, Diaphragmatic hernia without obstruction or                            gangrene                           K25.9, Gastric ulcer, unspecified as acute or                            chronic, without hemorrhage or perforation                           R13.10, Dysphagia, unspecified CPT copyright 2019 American Medical Association. All rights reserved. The codes documented in this report are preliminary and upon coder review may  be revised to meet current compliance requirements. Cristopher Estimable. Evely Gainey, MD Norvel Richards, MD 08/26/2021 11:41:41 AM This report has been signed electronically. Number of Addenda: 0

## 2021-08-27 ENCOUNTER — Telehealth: Payer: Self-pay | Admitting: Internal Medicine

## 2021-08-27 LAB — SURGICAL PATHOLOGY

## 2021-08-27 NOTE — Telephone Encounter (Signed)
Navarino, SHE HAS A QUESTION ABOUT A MEDICATION DR Gala Romney PUT HER ON YESTERDAY

## 2021-08-27 NOTE — Telephone Encounter (Signed)
Spoke with pt and advised her that Dr. Gala Romney prescribed her pantoprazole for her acid reflux and for her to stop taking the omeprazole. Pt verbalized understanding.

## 2021-08-28 ENCOUNTER — Encounter: Payer: Self-pay | Admitting: Internal Medicine

## 2021-08-28 ENCOUNTER — Encounter (HOSPITAL_COMMUNITY): Payer: Self-pay | Admitting: Internal Medicine

## 2021-08-31 ENCOUNTER — Other Ambulatory Visit: Payer: Self-pay

## 2021-08-31 ENCOUNTER — Ambulatory Visit (INDEPENDENT_AMBULATORY_CARE_PROVIDER_SITE_OTHER): Payer: Medicaid Other | Admitting: Orthopedic Surgery

## 2021-08-31 ENCOUNTER — Encounter: Payer: Self-pay | Admitting: Orthopedic Surgery

## 2021-08-31 VITALS — BP 143/85 | HR 72 | Ht 64.0 in | Wt 185.0 lb

## 2021-08-31 DIAGNOSIS — S63502A Unspecified sprain of left wrist, initial encounter: Secondary | ICD-10-CM

## 2021-08-31 NOTE — Progress Notes (Signed)
Chief Complaint  Patient presents with   Wrist Pain    LT wrist//fell DOI 08/10/21    51 year old female came in from the emergency room and as a follow-up complaining of improving left wrist pain after a fall  Initial x-rays were negative  She now says that the wrist is getting better after splinting  Exam shows tenderness over the dorsum of the distal forearm at the proximal aspect of the first extensor compartment Finkelstein's test is negative there is no tenderness over the scaphoid tubercle or snuffbox there is no pain with range of motion of the wrist  Recommend tapering use of the splint  She has increased pain after doing that she is advised to come back if not she should be fine no additional x-rays needed as there is no scaphoid pain or tenderness  Imaging x-ray wrist negative

## 2021-09-01 ENCOUNTER — Encounter: Payer: Self-pay | Admitting: Gastroenterology

## 2021-09-01 ENCOUNTER — Ambulatory Visit (INDEPENDENT_AMBULATORY_CARE_PROVIDER_SITE_OTHER): Payer: Medicaid Other | Admitting: Gastroenterology

## 2021-09-01 VITALS — BP 117/76 | HR 66 | Temp 97.3°F | Ht 64.0 in | Wt 187.2 lb

## 2021-09-01 DIAGNOSIS — K279 Peptic ulcer, site unspecified, unspecified as acute or chronic, without hemorrhage or perforation: Secondary | ICD-10-CM | POA: Diagnosis not present

## 2021-09-01 NOTE — Patient Instructions (Signed)
We will see you in April 2023 to arrange a repeat upper endoscopy!  It's important to stop taking any Goody powders, BC powders, aspirin, Ibuprofen, Advil, Aleve, etc. These can cause ulcers in your GI system.  Continue Protonix twice a day for now!  I enjoyed seeing you again today! As you know, I value our relationship and want to provide genuine, compassionate, and quality care. I welcome your feedback. If you receive a survey regarding your visit,  I greatly appreciate you taking time to fill this out. See you next time!  Annitta Needs, PhD, ANP-BC [facility]

## 2021-09-01 NOTE — Progress Notes (Signed)
Referring Provider: Carrolyn Meiers* Primary Care Physician:  Carrolyn Meiers, MD Primary GI: Dr. Gala Romney  Chief Complaint  Patient presents with   Hemorrhoids    Here for banding    HPI:   Alyssa Aguilar is a 51 y.o. female presenting today with a history of banding in 2018 X 2 sessions. Nov 2020-Jan 2021 underwent banding of all 3 columns and final was neutral position. Recent colonoscopy Jan 2023 with tubular adenomas. EGD also performed with non-bleeding cratered gastric ulcer, negative H.pylori. Empirically dilated due to dysphagia. Surveillance EGD in April 2023.   Had been taking Goody powders. On Protonix BID. Taking Goody powders for migraines. Dysphagia improved. No abdominal pain. No rectal bleeding. No itching or burning. No GERD exacerbations. No need for hemorrhoid banding today.    Past Medical History:  Diagnosis Date   GERD (gastroesophageal reflux disease)    Helicobacter pylori gastritis 2011   s/p treatment with Prevpac. ERADICATION DOCUMENTED VIA BREATH TEST   Hemorrhoids    IBS (irritable bowel syndrome)    Migraines    S/P colonoscopy March 2011   internal hemorrhoids   S/P endoscopy March 2011   antral erosions, chronic active gastritis, +H.pylori   Spina bifida occulta     Past Surgical History:  Procedure Laterality Date   CHOLECYSTECTOMY     COLONOSCOPY N/A 05/21/2015   BOF:BPZW canal hemorrhoids otherwise normal   COLONOSCOPY N/A 08/26/2021   two 3-5 mm polyps in descending colon. Tubular adenomas.   DILATION AND CURETTAGE OF UTERUS     egd/tcs  09/2009   Rourk-H.Pylori gastritis (s/p tx), benign sb bx, small hh, hemorrhoids, normal TI   ENDOMETRIAL ABLATION     ESOPHAGOGASTRODUODENOSCOPY N/A 08/26/2021   small hiatal hernia present. Normal esophagus. One non-bleeding cratered gastric ulcer, 4 mm, clean-based, benign appearing. Empirically dilated. Negative H.pylori.   HEMORRHOID BANDING     MALONEY DILATION N/A  08/26/2021   Procedure: Venia Minks DILATION;  Surgeon: Daneil Dolin, MD;  [facility];  Service: Endoscopy;  Laterality: N/A;   TUBAL LIGATION      Current Outpatient Medications  Medication Sig Dispense Refill   AIMOVIG 140 MG/ML SOAJ Inject 140 mg into the skin every 28 (twenty-eight) days.     albuterol (VENTOLIN HFA) 108 (90 Base) MCG/ACT inhaler Inhale 2 puffs into the lungs every 6 (six) hours as needed for wheezing or shortness of breath.     Aspirin-Acetaminophen-Caffeine (GOODY HEADACHE PO) Take 1 packet by mouth as needed (pain).     EPINEPHrine 0.3 mg/0.3 mL IJ SOAJ injection Inject 0.3 mg into the muscle as needed for anaphylaxis.     pantoprazole (PROTONIX) 40 MG tablet Take 40 mg by mouth 2 (two) times daily.     No current facility-administered medications for this visit.    Allergies as of 09/01/2021 - Review Complete 09/01/2021  Allergen Reaction Noted   Bee venom Anaphylaxis 11/06/2011   Tylenol with codeine #3 [acetaminophen-codeine] Swelling 08/25/2021    Family History  Problem Relation Age of Onset   Colon cancer Maternal Grandmother    Cancer Maternal Grandmother        Breast Cancer   Breast cancer Maternal Grandmother    Hypertension Maternal Grandmother    Diabetes Maternal Grandmother    Heart disease Maternal Grandmother    Asthma Mother    Colon cancer Maternal Uncle     Social History   Socioeconomic History   Marital status: Single  Spouse name: Not on file   Number of children: 1   Years of education: Not on file   Highest education level: Not on file  Occupational History   Occupation: disabled    Comment: "spinal issues"  Tobacco Use   Smoking status: Every Day    Packs/day: 2.50    Years: 23.00    Pack years: 57.50    Types: Cigarettes   Smokeless tobacco: Never   Tobacco comments:    one pack a day  Vaping Use   Vaping Use: Never used  Substance and Sexual Activity   Alcohol use: No   Drug use: No   Sexual  activity: Yes    Birth control/protection: Surgical  Other Topics Concern   Not on file  Social History Narrative   Lives w/ grandmother & daughter   Social Determinants of Health   Financial Resource Strain: Not on file  Food Insecurity: Not on file  Transportation Aguilar: Not on file  Physical Activity: Not on file  Stress: Not on file  Social Connections: Not on file    Review of Systems: Gen: Denies fever, chills, anorexia. Denies fatigue, weakness, weight loss.  CV: Denies chest pain, palpitations, syncope, peripheral edema, and claudication. Resp: Denies dyspnea at rest, cough, wheezing, coughing up blood, and pleurisy. GI: see HPI Derm: Denies rash, itching, dry skin Psych: Denies depression, anxiety, memory loss, confusion. No homicidal or suicidal ideation.  Heme: Denies bruising, bleeding, and enlarged lymph nodes.  Physical Exam: BP 117/76    Pulse 66    Temp (!) 97.3 F (36.3 C)    Ht 5\' 4"  (1.626 m)    Wt 187 lb 3.2 oz (84.9 kg)    BMI 32.13 kg/m  General:   Alert and oriented. No distress noted. Pleasant and cooperative.  Head:  Normocephalic and atraumatic. Eyes:  Conjuctiva clear without scleral icterus. Mouth:  mask in place Abdomen:  +BS, soft, non-tender and non-distended. No rebound or guarding. No HSM or masses noted. Msk:  Symmetrical without gross deformities. Normal posture. Extremities:  Without edema. Neurologic:  Alert and  oriented x4 Psych:  Alert and cooperative. Normal mood and affect.  ASSESSMENT: Alyssa Aguilar is a 51 y.o. female presenting today with a history of banding in 2018 X 2 sessions. Nov 2020-Jan 2021 underwent banding of all 3 columns and final was neutral position. Here for follow-up after colonoscopy/EGD.  PUD: noted on EGD. Due to [facility] powders. Discussed absolute avoidance of this. Continue PPI BID. Will need surveillance around April 2023.  Adenomas: surveillance colonoscopy will be due in 5 years.   History of hemorrhoids:  asymptomatic today. No banding needed.     PLAN:   Continue PPI BID Avoid all NSAIDs/ aspirin powders Return in April 2023 to arrange surveillance EGD  Alyssa Needs, PhD, ANP-BC [facility]

## 2021-09-14 ENCOUNTER — Encounter: Payer: Self-pay | Admitting: Gastroenterology

## 2021-12-16 ENCOUNTER — Telehealth: Payer: Self-pay | Admitting: *Deleted

## 2021-12-16 ENCOUNTER — Encounter: Payer: Self-pay | Admitting: Gastroenterology

## 2021-12-16 ENCOUNTER — Ambulatory Visit (INDEPENDENT_AMBULATORY_CARE_PROVIDER_SITE_OTHER): Payer: Medicaid Other | Admitting: Gastroenterology

## 2021-12-16 VITALS — BP 114/75 | HR 65 | Ht 64.0 in | Wt 180.4 lb

## 2021-12-16 DIAGNOSIS — K279 Peptic ulcer, site unspecified, unspecified as acute or chronic, without hemorrhage or perforation: Secondary | ICD-10-CM

## 2021-12-16 NOTE — Telephone Encounter (Signed)
Spoke with pt. She has been scheduled for EGD with Dr. Gala Romney, ASA 2 on 6/19 at 12:15pm. Aware will mail instructions to her. ?

## 2021-12-16 NOTE — Progress Notes (Signed)
?  ? ? ? ? ?Gastroenterology Office Note   ? ? ?Primary Care Physician:  Carrolyn Meiers, MD  ?Primary Gastroenterologist: ? ? ?Chief Complaint  ? ?Chief Complaint  ?Patient presents with  ? Follow-up  ?  EGD for ulcers  ? ? ? ?History of Present Illness  ? ?Alyssa Aguilar is a 51 y.o. female presenting today in follow-up with a history of banding in 2018 X 2 sessions. Nov 2020-Jan 2021 underwent banding of all 3 columns and final was neutral position. Recent colonoscopy Jan 2023 with tubular adenomas. EGD also performed with non-bleeding cratered gastric ulcer, negative H.pylori. Empirically dilated due to dysphagia. Needs surveillance EGD arranged now.  ? ? ?Chronic migraine headaches. Decreased Goody powder use to just as needed for migraines. No abdominal pain. No dysphagia. No constipation/diarrhea. Taking pantoprazole once daily. No overt GI bleeding.  ? ? ? ?Past Medical History:  ?Diagnosis Date  ? GERD (gastroesophageal reflux disease)   ? Helicobacter pylori gastritis 2011  ? s/p treatment with Prevpac. ERADICATION DOCUMENTED VIA BREATH TEST  ? Hemorrhoids   ? IBS (irritable bowel syndrome)   ? Migraines   ? S/P colonoscopy March 2011  ? internal hemorrhoids  ? S/P endoscopy March 2011  ? antral erosions, chronic active gastritis, +H.pylori  ? Spina bifida occulta   ? ? ?Past Surgical History:  ?Procedure Laterality Date  ? CHOLECYSTECTOMY    ? COLONOSCOPY N/A 05/21/2015  ? GDJ:MEQA canal hemorrhoids otherwise normal  ? COLONOSCOPY N/A 08/26/2021  ? two 3-5 mm polyps in descending colon. Tubular adenomas.  ? DILATION AND CURETTAGE OF UTERUS    ? egd/tcs  09/2009  ? Rourk-H.Pylori gastritis (s/p tx), benign sb bx, small hh, hemorrhoids, normal TI  ? ENDOMETRIAL ABLATION    ? ESOPHAGOGASTRODUODENOSCOPY N/A 08/26/2021  ? small hiatal hernia present. Normal esophagus. One non-bleeding cratered gastric ulcer, 4 mm, clean-based, benign appearing. Empirically dilated. Negative H.pylori.  ? HEMORRHOID  BANDING    ? MALONEY DILATION N/A 08/26/2021  ? Procedure: MALONEY DILATION;  Surgeon: Daneil Dolin, MD;  Location: [facility];  Service: Endoscopy;  Laterality: N/A;  ? TUBAL LIGATION    ? ? ?Current Outpatient Medications  ?Medication Sig Dispense Refill  ? AIMOVIG 140 MG/ML SOAJ Inject 140 mg into the skin every 28 (twenty-eight) days.    ? albuterol (VENTOLIN HFA) 108 (90 Base) MCG/ACT inhaler Inhale 2 puffs into the lungs every 6 (six) hours as needed for wheezing or shortness of breath.    ? EPINEPHrine 0.3 mg/0.3 mL IJ SOAJ injection Inject 0.3 mg into the muscle as needed for anaphylaxis.    ? pantoprazole (PROTONIX) 40 MG tablet Take 40 mg by mouth daily.    ? ?No current facility-administered medications for this visit.  ? ? ?Allergies as of 12/16/2021 - Review Complete 12/16/2021  ?Allergen Reaction Noted  ? Bee venom Anaphylaxis 11/06/2011  ? Tylenol with codeine #3 [acetaminophen-codeine] Swelling 08/25/2021  ? ? ?Family History  ?Problem Relation Age of Onset  ? Colon cancer Maternal Grandmother   ? Cancer Maternal Grandmother   ?     Breast Cancer  ? Breast cancer Maternal Grandmother   ? Hypertension Maternal Grandmother   ? Diabetes Maternal Grandmother   ? Heart disease Maternal Grandmother   ? Asthma Mother   ? Colon cancer Maternal Uncle   ? ? ?Social History  ? ?Socioeconomic History  ? Marital status: Single  ?  Spouse name: Not on file  ?  Number of children: 1  ? Years of education: Not on file  ? Highest education level: Not on file  ?Occupational History  ? Occupation: disabled  ?  Comment: "spinal issues"  ?Tobacco Use  ? Smoking status: Every Day  ?  Packs/day: 2.50  ?  Years: 23.00  ?  Pack years: 57.50  ?  Types: Cigarettes  ? Smokeless tobacco: Never  ? Tobacco comments:  ?  one pack a day  ?Vaping Use  ? Vaping Use: Never used  ?Substance and Sexual Activity  ? Alcohol use: No  ? Drug use: No  ? Sexual activity: Yes  ?  Birth control/protection: Surgical  ?Other Topics Concern  ?  Not on file  ?Social History Narrative  ? Lives w/ grandmother & daughter  ? ?Social Determinants of Health  ? ?Financial Resource Strain: Not on file  ?Food Insecurity: Not on file  ?Transportation Needs: Not on file  ?Physical Activity: Not on file  ?Stress: Not on file  ?Social Connections: Not on file  ?Intimate Partner Violence: Not on file  ? ? ? ?Review of Systems  ? ?Gen: Denies any fever, chills, fatigue, weight loss, lack of appetite.  ?CV: Denies chest pain, heart palpitations, peripheral edema, syncope.  ?Resp: Denies shortness of breath at rest or with exertion. Denies wheezing or cough.  ?GI: see HPI ?GU : Denies urinary burning, urinary frequency, urinary hesitancy ?MS: Denies joint pain, muscle weakness, cramps, or limitation of movement.  ?Derm: Denies rash, itching, dry skin ?Psych: Denies depression, anxiety, memory loss, and confusion ?Heme: Denies bruising, bleeding, and enlarged lymph nodes. ? ? ?Physical Exam  ? ?BP 114/75   Pulse 65   Ht '5\' 4"'$  (1.626 m)   Wt 180 lb 6.4 oz (81.8 kg)   BMI 30.97 kg/m?  ?General:   Alert and oriented. Pleasant and cooperative. Well-nourished and well-developed.  ?Head:  Normocephalic and atraumatic. ?Eyes:  Without icterus ?Abdomen:  +BS, soft, non-tender and non-distended. No HSM noted. No guarding or rebound. No masses appreciated.  ?Rectal:  Deferred  ?Msk:  Symmetrical without gross deformities. Normal posture. ?Extremities:  Without edema. ?Neurologic:  Alert and  oriented x4;  grossly normal neurologically. ?Skin:  Intact without significant lesions or rashes. ?Psych:  Alert and cooperative. Normal mood and affect. ? ? ?Assessment  ? ?Alyssa Aguilar is a 51 y.o. female presenting today in follow-up with a history of PUD in setting of aspirin powders Jan 2023, due for surveillance now.  ? ?She has limited her Goody powder use but still using prn for migraines. We discussed again avoidance of all NSAIDs. She continues on pantoprazole daily. No concerns  today. ? ? ?PLAN  ? ? ?Proceed with upper endoscopy by Dr. Gala Romney in near future: the risks, benefits, and alternatives have been discussed with the patient in detail. The patient states understanding and desires to proceed.  ?Follow-up in 6 months ?Continue PPI daily ? ? ?Annitta Needs, PhD, ANP-BC ?[facility]  ? ? ?

## 2021-12-16 NOTE — Patient Instructions (Signed)
We are arranging an upper endoscopy in the near future! ? ?Continue pantoprazole once daily.  ? ?Avoid anything like Ibuprofen, Advil, Motrin, Goody powders, and BC powders. ? ?We will see you in 6 months! ? ?I enjoyed seeing you again today! As you know, I value our relationship and want to provide genuine, compassionate, and quality care. I welcome your feedback. If you receive a survey regarding your visit,  I greatly appreciate you taking time to fill this out. See you next time! ? ?Annitta Needs, PhD, ANP-BC ?[facility]  ? ?

## 2022-01-18 ENCOUNTER — Ambulatory Visit (HOSPITAL_COMMUNITY)
Admission: RE | Admit: 2022-01-18 | Discharge: 2022-01-18 | Disposition: A | Discharge status: Home / Self Care | Payer: Medicaid Other | Attending: Internal Medicine | Admitting: Internal Medicine

## 2022-01-18 ENCOUNTER — Ambulatory Visit (HOSPITAL_COMMUNITY): Payer: Medicaid Other | Admitting: Anesthesiology

## 2022-01-18 ENCOUNTER — Other Ambulatory Visit: Payer: Self-pay

## 2022-01-18 ENCOUNTER — Encounter (HOSPITAL_COMMUNITY)
Admission: RE | Disposition: A | Discharge status: Home / Self Care | Payer: Self-pay | Source: Home / Self Care | Attending: Internal Medicine

## 2022-01-18 ENCOUNTER — Ambulatory Visit (HOSPITAL_BASED_OUTPATIENT_CLINIC_OR_DEPARTMENT_OTHER): Payer: Medicaid Other | Admitting: Anesthesiology

## 2022-01-18 ENCOUNTER — Encounter (HOSPITAL_COMMUNITY): Payer: Self-pay | Admitting: Internal Medicine

## 2022-01-18 DIAGNOSIS — R1310 Dysphagia, unspecified: Secondary | ICD-10-CM

## 2022-01-18 DIAGNOSIS — K279 Peptic ulcer, site unspecified, unspecified as acute or chronic, without hemorrhage or perforation: Secondary | ICD-10-CM | POA: Diagnosis not present

## 2022-01-18 DIAGNOSIS — R1013 Epigastric pain: Secondary | ICD-10-CM

## 2022-01-18 DIAGNOSIS — Z8711 Personal history of peptic ulcer disease: Secondary | ICD-10-CM | POA: Insufficient documentation

## 2022-01-18 DIAGNOSIS — K3189 Other diseases of stomach and duodenum: Secondary | ICD-10-CM | POA: Diagnosis not present

## 2022-01-18 DIAGNOSIS — K219 Gastro-esophageal reflux disease without esophagitis: Secondary | ICD-10-CM | POA: Diagnosis not present

## 2022-01-18 DIAGNOSIS — F17210 Nicotine dependence, cigarettes, uncomplicated: Secondary | ICD-10-CM | POA: Insufficient documentation

## 2022-01-18 DIAGNOSIS — Z09 Encounter for follow-up examination after completed treatment for conditions other than malignant neoplasm: Secondary | ICD-10-CM | POA: Insufficient documentation

## 2022-01-18 DIAGNOSIS — R519 Headache, unspecified: Secondary | ICD-10-CM | POA: Diagnosis not present

## 2022-01-18 DIAGNOSIS — K625 Hemorrhage of anus and rectum: Secondary | ICD-10-CM

## 2022-01-18 DIAGNOSIS — A5901 Trichomonal vulvovaginitis: Secondary | ICD-10-CM

## 2022-01-18 DIAGNOSIS — K589 Irritable bowel syndrome without diarrhea: Secondary | ICD-10-CM

## 2022-01-18 DIAGNOSIS — K5900 Constipation, unspecified: Secondary | ICD-10-CM

## 2022-01-18 DIAGNOSIS — Z862 Personal history of diseases of the blood and blood-forming organs and certain disorders involving the immune mechanism: Secondary | ICD-10-CM

## 2022-01-18 DIAGNOSIS — R109 Unspecified abdominal pain: Secondary | ICD-10-CM

## 2022-01-18 DIAGNOSIS — B9681 Helicobacter pylori [H. pylori] as the cause of diseases classified elsewhere: Secondary | ICD-10-CM

## 2022-01-18 DIAGNOSIS — K649 Unspecified hemorrhoids: Secondary | ICD-10-CM

## 2022-01-18 HISTORY — PX: ESOPHAGOGASTRODUODENOSCOPY (EGD) WITH PROPOFOL: SHX5813

## 2022-01-18 SURGERY — ESOPHAGOGASTRODUODENOSCOPY (EGD) WITH PROPOFOL
Anesthesia: General

## 2022-01-18 MED ORDER — PROPOFOL 10 MG/ML IV BOLUS
INTRAVENOUS | Status: DC | PRN
  Administered 2022-01-18: 50 mg via INTRAVENOUS
  Administered 2022-01-18: 30 mg via INTRAVENOUS
  Administered 2022-01-18: 75 mg via INTRAVENOUS
  Administered 2022-01-18 (×2): 25 mg via INTRAVENOUS

## 2022-01-18 MED ORDER — LACTATED RINGERS IV SOLN
INTRAVENOUS | Status: DC
  Administered 2022-01-18: 1000 mL via INTRAVENOUS

## 2022-01-18 MED ORDER — LIDOCAINE HCL 1 % IJ SOLN
INTRAMUSCULAR | Status: DC | PRN
  Administered 2022-01-18: 20 mg via INTRADERMAL

## 2022-01-18 NOTE — Anesthesia Preprocedure Evaluation (Signed)
Anesthesia Evaluation  Patient identified by MRN, date of birth, ID band Patient awake    Reviewed: Allergy & Precautions, NPO status , Patient's Chart, lab work & pertinent test results  Airway Mallampati: II  TM Distance: >3 FB Neck ROM: Full    Dental  (+) Dental Advisory Given, Missing   Pulmonary Current Smoker and Patient abstained from smoking.,    Pulmonary exam normal breath sounds clear to auscultation       Cardiovascular negative cardio ROS Normal cardiovascular exam Rhythm:Regular Rate:Normal     Neuro/Psych  Headaches, negative psych ROS   GI/Hepatic Neg liver ROS, PUD, GERD  Medicated,  Endo/Other  negative endocrine ROS  Renal/GU negative Renal ROS  negative genitourinary   Musculoskeletal negative musculoskeletal ROS (+)   Abdominal   Peds negative pediatric ROS (+)  Hematology negative hematology ROS (+)   Anesthesia Other Findings   Reproductive/Obstetrics negative OB ROS                             Anesthesia Physical Anesthesia Plan  ASA: 2  Anesthesia Plan: General   Post-op Pain Management: Minimal or no pain anticipated   Induction:   PONV Risk Score and Plan: Propofol infusion  Airway Management Planned: Nasal Cannula and Natural Airway  Additional Equipment:   Intra-op Plan:   Post-operative Plan:   Informed Consent: I have reviewed the patients History and Physical, chart, labs and discussed the procedure including the risks, benefits and alternatives for the proposed anesthesia with the patient or authorized representative who has indicated his/her understanding and acceptance.     Dental advisory given  Plan Discussed with: CRNA and Surgeon  Anesthesia Plan Comments:         Anesthesia Quick Evaluation

## 2022-01-18 NOTE — Discharge Instructions (Signed)
EGD Discharge instructions Please read the instructions outlined below and refer to this sheet in the next few weeks. These discharge instructions provide you with general information on caring for yourself after you leave the hospital. Your doctor may also give you specific instructions. While your treatment has been planned according to the most current medical practices available, unavoidable complications occasionally occur. If you have any problems or questions after discharge, please call your doctor. ACTIVITY You may resume your regular activity but move at a slower pace for the next 24 hours.  Take frequent rest periods for the next 24 hours.  Walking will help expel (get rid of) the air and reduce the bloated feeling in your abdomen.  No driving for 24 hours (because of the anesthesia (medicine) used during the test).  You may shower.  Do not sign any important legal documents or operate any machinery for 24 hours (because of the anesthesia used during the test).  NUTRITION Drink plenty of fluids.  You may resume your normal diet.  Begin with a light meal and progress to your normal diet.  Avoid alcoholic beverages for 24 hours or as instructed by your caregiver.  MEDICATIONS You may resume your normal medications unless your caregiver tells you otherwise.  WHAT YOU CAN EXPECT TODAY You may experience abdominal discomfort such as a feeling of fullness or "gas" pains.  FOLLOW-UP Your doctor will discuss the results of your test with you.  SEEK IMMEDIATE MEDICAL ATTENTION IF ANY OF THE FOLLOWING OCCUR: Excessive nausea (feeling sick to your stomach) and/or vomiting.  Severe abdominal pain and distention (swelling).  Trouble swallowing.  Temperature over 101 F (37.8 C).  Rectal bleeding or vomiting of blood.    Your ulcer is completely healed  Continue to avoid aspirin containing headache powders and NSAID drugs like Motrin  I recommend you decrease Protonix to 40 mg once  daily before breakfast  Office visit with [place] in 1 year and as scheduled  At patient request, I called Jasmine at [phone] -reviewed findings and recommendations

## 2022-01-18 NOTE — Transfer of Care (Signed)
Immediate Anesthesia Transfer of Care Note  Patient: Alyssa Aguilar  Procedure(s) Performed: ESOPHAGOGASTRODUODENOSCOPY (EGD) WITH PROPOFOL  Patient Location: Endoscopy Unit  Anesthesia Type:General  Level of Consciousness: awake  Airway & Oxygen Therapy: Patient Spontanous Breathing  Post-op Assessment: Report given to RN  Post vital signs: Reviewed  Last Vitals:  Vitals Value Taken Time  BP 119/98 01/18/22 1119  Temp 36.4 C 01/18/22 1119  Pulse 84 01/18/22 1119  Resp 16 01/18/22 1119  SpO2 94 % 01/18/22 1119    Last Pain:  Vitals:   01/18/22 1119  TempSrc: Oral  PainSc:       Patients Stated Pain Goal: 8 (71/83/67 2550)  Complications: No notable events documented.

## 2022-01-18 NOTE — Op Note (Signed)
[facility] Patient Name: Alyssa Aguilar Procedure Date: 01/18/2022 10:54 AM MRN: [id number] Date of Birth: Nov 18, 1970 Attending MD: Norvel Richards , MD CSN: [id number] Age: 51 Admit Type: Outpatient Procedure:                Upper GI endoscopy Indications:              Peptic ulcer Providers:                Norvel Richards, MD, Caprice Kluver, Raphael Gibney, Technician Referring MD:              Medicines:                Propofol per Anesthesia Complications:            No immediate complications. Estimated Blood Loss:     Estimated blood loss: none. Procedure:                Pre-Anesthesia Assessment:                           - Prior to the procedure, a History and Physical                            was performed, and patient medications and                            allergies were reviewed. The patient's tolerance of                            previous anesthesia was also reviewed. The risks                            and benefits of the procedure and the sedation                            options and risks were discussed with the patient.                            All questions were answered, and informed consent                            was obtained. Prior Anticoagulants: The patient has                            taken no previous anticoagulant or antiplatelet                            agents. ASA Grade Assessment: II - A patient with                            mild systemic disease. After reviewing the risks  and benefits, the patient was deemed in                            satisfactory condition to undergo the procedure.                           After obtaining informed consent, the endoscope was                            passed under direct vision. Throughout the                            procedure, the patient's blood pressure, pulse, and                            oxygen saturations were monitored  continuously. The                            [id number]-H190 ([id number]) scope was introduced through the                            mouth, and advanced to the second part of duodenum.                            The upper GI endoscopy was accomplished without                            difficulty. The patient tolerated the procedure                            well. Scope In: 11:10:17 AM Scope Out: 11:13:02 AM Total Procedure Duration: 0 hours 2 minutes 45 seconds  Findings:      The examined esophagus was normal.      Minimal gastric erythema in the antrum. Previously noted gastric ulcer       completely healed.      The duodenal bulb and second portion of the duodenum were normal.       Estimated blood loss: none. Impression:               - Normal esophagus.                           -Gastric erythema; previously noted ulcer                            completely healed.                           - Normal duodenal bulb and second portion of the                            duodenum.                           - No specimens collected. Moderate Sedation:      Moderate (conscious) sedation was personally  administered by an       anesthesia professional. The following parameters were monitored: oxygen       saturation, heart rate, blood pressure, respiratory rate, EKG, adequacy       of pulmonary ventilation, and response to care.      Moderate (conscious) sedation was personally administered by an       anesthesia professional. The following parameters were monitored: oxygen       saturation, heart rate, blood pressure, and response to care. Recommendation:           - Patient has a contact number available for                            emergencies. The signs and symptoms of potential                            delayed complications were discussed with the                            patient. Return to normal activities tomorrow.                            Written discharge instructions were  provided to the                            patient.                           - Resume previous diet.                           - Continue present medications. May decrease                            Protonix to 40 mg once daily. Avoid aspirin and                            NSAIDs moving forward.                           - Return to my office in 1 year. Procedure Code(s):        --- Professional ---                           [phone], Esophagogastroduodenoscopy, flexible,                            transoral; diagnostic, including collection of                            specimen(s) by brushing or washing, when performed                            (separate procedure) Diagnosis Code(s):        --- Professional ---  K27.9, Peptic ulcer, site unspecified, unspecified                            as acute or chronic, without hemorrhage or                            perforation CPT copyright 2019 American Medical Association. All rights reserved. The codes documented in this report are preliminary and upon coder review may  be revised to meet current compliance requirements. Cristopher Estimable. Poppy Mcafee, MD Norvel Richards, MD 01/18/2022 11:23:01 AM This report has been signed electronically. Number of Addenda: 0

## 2022-01-18 NOTE — H&P (Signed)
$'@LOGO'H$ @   Primary Care Physician:  Carrolyn Meiers, MD Primary Gastroenterologist:  Dr. Gala Romney  Pre-Procedure History & Physical: HPI:  Alyssa Aguilar is a 51 y.o. female here for surveillance EGD.  History of prepyloric gastric ulcer found in January of this year.  Biopsies negative for malignancy and H. pylori.  Positive headache powder use-has decreased use. Patient denies dysphagia.  Past Medical History:  Diagnosis Date   GERD (gastroesophageal reflux disease)    Helicobacter pylori gastritis 2011   s/p treatment with Prevpac. ERADICATION DOCUMENTED VIA BREATH TEST   Hemorrhoids    IBS (irritable bowel syndrome)    Migraines    S/P colonoscopy March 2011   internal hemorrhoids   S/P endoscopy March 2011   antral erosions, chronic active gastritis, +H.pylori   Spina bifida occulta     Past Surgical History:  Procedure Laterality Date   CHOLECYSTECTOMY     COLONOSCOPY N/A 05/21/2015   KKX:FGHW canal hemorrhoids otherwise normal   COLONOSCOPY N/A 08/26/2021   two 3-5 mm polyps in descending colon. Tubular adenomas.   DILATION AND CURETTAGE OF UTERUS     egd/tcs  09/2009   Metztli Sachdev-H.Pylori gastritis (s/p tx), benign sb bx, small hh, hemorrhoids, normal TI   ENDOMETRIAL ABLATION     ESOPHAGOGASTRODUODENOSCOPY N/A 08/26/2021   small hiatal hernia present. Normal esophagus. One non-bleeding cratered gastric ulcer, 4 mm, clean-based, benign appearing. Empirically dilated. Negative H.pylori.   HEMORRHOID BANDING     MALONEY DILATION N/A 08/26/2021   Procedure: Venia Minks DILATION;  Surgeon: Daneil Dolin, MD;  Location: [facility];  Service: Endoscopy;  Laterality: N/A;   TUBAL LIGATION      Prior to Admission medications   Medication Sig Start Date End Date Taking? Authorizing Provider  EPINEPHrine 0.3 mg/0.3 mL IJ SOAJ injection Inject 0.3 mg into the muscle as needed for anaphylaxis.   Yes [provider]  omeprazole (PRILOSEC) 20 MG capsule Take 20 mg by  mouth daily.   Yes [provider]  pantoprazole (PROTONIX) 40 MG tablet Take 40 mg by mouth 2 (two) times daily.   Yes [provider]  predniSONE (DELTASONE) 20 MG tablet Take 20 mg by mouth daily with breakfast.   Yes [provider]  AIMOVIG 140 MG/ML SOAJ Inject 140 mg into the skin every 28 (twenty-eight) days. 03/11/20   [provider]    Allergies as of 12/16/2021 - Review Complete 12/16/2021  Allergen Reaction Noted   Bee venom Anaphylaxis 11/06/2011   Tylenol with codeine #3 [acetaminophen-codeine] Swelling 08/25/2021    Family History  Problem Relation Age of Onset   Colon cancer Maternal Grandmother    Cancer Maternal Grandmother        Breast Cancer   Breast cancer Maternal Grandmother    Hypertension Maternal Grandmother    Diabetes Maternal Grandmother    Heart disease Maternal Grandmother    Asthma Mother    Colon cancer Maternal Uncle     Social History   Socioeconomic History   Marital status: Single    Spouse name: Not on file   Number of children: 1   Years of education: Not on file   Highest education level: Not on file  Occupational History   Occupation: disabled    Comment: "spinal issues"  Tobacco Use   Smoking status: Every Day    Packs/day: 2.50    Years: 23.00    Total pack years: 57.50    Types: Cigarettes   Smokeless tobacco: Never  Tobacco comments:    one pack a day  Vaping Use   Vaping Use: Never used  Substance and Sexual Activity   Alcohol use: No   Drug use: No   Sexual activity: Yes    Birth control/protection: Surgical  Other Topics Concern   Not on file  Social History Narrative   Lives w/ grandmother & daughter   Social Determinants of Health   Financial Resource Strain: Medium Risk (12/24/2019)   Overall Financial Resource Strain (CARDIA)    Difficulty of Paying Living Expenses: Somewhat hard  Food Insecurity: Food Insecurity Present (12/24/2019)   Hunger Vital Sign    Worried  About Running Out of Food in the Last Year: Sometimes true    Ran Out of Food in the Last Year: Sometimes true  Transportation Needs: No Transportation Needs (12/24/2019)   PRAPARE - Hydrologist (Medical): No    Lack of Transportation (Non-Medical): No  Physical Activity: Sufficiently Active (12/24/2019)   Exercise Vital Sign    Days of Exercise per Week: 6 days    Minutes of Exercise per Session: 90 min  Stress: No Stress Concern Present (12/24/2019)   [place]    Feeling of Stress : Only a little  Social Connections: Moderately Isolated (12/24/2019)   Social Connection and Isolation Panel [NHANES]    Frequency of Communication with Friends and Family: More than three times a week    Frequency of Social Gatherings with Friends and Family: More than three times a week    Attends Religious Services: 1 to 4 times per year    Active Member of [employer] or Organizations: No    Attends Archivist Meetings: Never    Marital Status: Never married  Intimate Partner Violence: Not At Risk (12/24/2019)   Humiliation, Afraid, Rape, and Kick questionnaire    Fear of Current or Ex-Partner: No    Emotionally Abused: No    Physically Abused: No    Sexually Abused: No    Review of Systems: See HPI, otherwise negative ROS  Physical Exam: BP 131/89   Pulse 66   Temp 98.6 F (37 C) (Oral)   Resp 15   Ht '5\' 4"'$  (1.626 m)   Wt 81.6 kg   SpO2 98%   BMI 30.90 kg/m  General:   Alert,  Well-developed, well-nourished, pleasant and cooperative in NAD Neck:  Supple; no masses or thyromegaly. No significant cervical adenopathy. Lungs:  Clear throughout to auscultation.   No wheezes, crackles, or rhonchi. No acute distress. Heart:  Regular rate and rhythm; no murmurs, clicks, rubs,  or gallops. Abdomen: Non-distended, normal bowel sounds.  Soft and nontender without appreciable mass or hepatosplenomegaly.   Pulses:  Normal pulses noted. Extremities:  Without clubbing or edema.  Impression/Plan: 51 year old lady with a history of gastric ulcer.  Positive headache powder use.  Here for surveillance EGD. The risks, benefits, limitations, alternatives and imponderables have been reviewed with the patient. Potential for esophageal dilation, biopsy, etc. have also been reviewed.  Questions have been answered. All parties agreeable.      Notice: This dictation was prepared with Dragon dictation along with smaller phrase technology. Any transcriptional errors that result from this process are unintentional and may not be corrected upon review.

## 2022-01-18 NOTE — Anesthesia Postprocedure Evaluation (Signed)
Anesthesia Post Note  Patient: Alyssa Aguilar  Procedure(s) Performed: ESOPHAGOGASTRODUODENOSCOPY (EGD) WITH PROPOFOL  Patient location during evaluation: Endoscopy Anesthesia Type: General Level of consciousness: awake and alert Pain management: pain level controlled Vital Signs Assessment: post-procedure vital signs reviewed and stable Respiratory status: spontaneous breathing Cardiovascular status: blood pressure returned to baseline and stable Postop Assessment: no apparent nausea or vomiting Anesthetic complications: no   No notable events documented.   Last Vitals:  Vitals:   01/18/22 1023 01/18/22 1119  BP: 131/89 (!) 119/98  Pulse: 66 84  Resp: 15 16  Temp: 37 C (!) 36.4 C  SpO2: 98% 94%    Last Pain:  Vitals:   01/18/22 1119  TempSrc: Oral  PainSc:                  Tressie Stalker

## 2022-01-25 ENCOUNTER — Encounter (HOSPITAL_COMMUNITY): Payer: Self-pay | Admitting: Internal Medicine

## 2022-04-15 ENCOUNTER — Other Ambulatory Visit (HOSPITAL_COMMUNITY): Payer: Self-pay | Admitting: Internal Medicine

## 2022-04-15 DIAGNOSIS — Z1231 Encounter for screening mammogram for malignant neoplasm of breast: Secondary | ICD-10-CM

## 2022-04-28 ENCOUNTER — Ambulatory Visit (HOSPITAL_COMMUNITY)
Admission: RE | Admit: 2022-04-28 | Discharge: 2022-04-28 | Disposition: A | Discharge status: Home / Self Care | Payer: Medicaid Other | Source: Ambulatory Visit | Attending: Internal Medicine | Admitting: Internal Medicine

## 2022-04-28 DIAGNOSIS — Z1231 Encounter for screening mammogram for malignant neoplasm of breast: Secondary | ICD-10-CM | POA: Diagnosis present

## 2022-06-01 ENCOUNTER — Other Ambulatory Visit (HOSPITAL_COMMUNITY)
Admission: RE | Admit: 2022-06-01 | Discharge: 2022-06-01 | Disposition: A | Discharge status: Home / Self Care | Payer: Medicaid Other | Source: Ambulatory Visit | Attending: Obstetrics & Gynecology | Admitting: Obstetrics & Gynecology

## 2022-06-01 ENCOUNTER — Ambulatory Visit (INDEPENDENT_AMBULATORY_CARE_PROVIDER_SITE_OTHER): Payer: Medicaid Other | Admitting: Obstetrics & Gynecology

## 2022-06-01 ENCOUNTER — Encounter: Payer: Self-pay | Admitting: Obstetrics & Gynecology

## 2022-06-01 VITALS — BP 119/82 | HR 57 | Ht 64.0 in | Wt 178.0 lb

## 2022-06-01 DIAGNOSIS — Z1211 Encounter for screening for malignant neoplasm of colon: Secondary | ICD-10-CM

## 2022-06-01 DIAGNOSIS — Z0000 Encounter for general adult medical examination without abnormal findings: Secondary | ICD-10-CM | POA: Diagnosis not present

## 2022-06-01 DIAGNOSIS — Z1212 Encounter for screening for malignant neoplasm of rectum: Secondary | ICD-10-CM | POA: Diagnosis not present

## 2022-06-01 DIAGNOSIS — Z01419 Encounter for gynecological examination (general) (routine) without abnormal findings: Secondary | ICD-10-CM | POA: Diagnosis present

## 2022-06-01 NOTE — Progress Notes (Signed)
Subjective:     Alyssa Aguilar is a 51 y.o. female here for a routine exam.  No LMP recorded. Patient has had an ablation. G1P0100 Birth Control Method:  menopausal Menstrual Calendar(currently): amenorrhea  Current complaints: none.   Current acute medical issues:  none   Recent Gynecologic History No LMP recorded. Patient has had an ablation. Last Pap: negative,  normal Last mammogram: 9/23,  normal  Past Medical History:  Diagnosis Date   GERD (gastroesophageal reflux disease)    Helicobacter pylori gastritis 2011   s/p treatment with Prevpac. ERADICATION DOCUMENTED VIA BREATH TEST   Hemorrhoids    IBS (irritable bowel syndrome)    Migraines    S/P colonoscopy March 2011   internal hemorrhoids   S/P endoscopy March 2011   antral erosions, chronic active gastritis, +H.pylori   Spina bifida occulta     Past Surgical History:  Procedure Laterality Date   CHOLECYSTECTOMY     COLONOSCOPY N/A 05/21/2015   CBJ:SEGB canal hemorrhoids otherwise normal   COLONOSCOPY N/A 08/26/2021   two 3-5 mm polyps in descending colon. Tubular adenomas.   DILATION AND CURETTAGE OF UTERUS     egd/tcs  09/2009   Rourk-H.Pylori gastritis (s/p tx), benign sb bx, small hh, hemorrhoids, normal TI   ENDOMETRIAL ABLATION     ESOPHAGOGASTRODUODENOSCOPY N/A 08/26/2021   small hiatal hernia present. Normal esophagus. One non-bleeding cratered gastric ulcer, 4 mm, clean-based, benign appearing. Empirically dilated. Negative H.pylori.   ESOPHAGOGASTRODUODENOSCOPY (EGD) WITH PROPOFOL N/A 01/18/2022   Procedure: ESOPHAGOGASTRODUODENOSCOPY (EGD) WITH PROPOFOL;  Surgeon: Daneil Dolin, MD;  Location: [facility];  Service: Endoscopy;  Laterality: N/A;  12:15pm   HEMORRHOID BANDING     MALONEY DILATION N/A 08/26/2021   Procedure: MALONEY DILATION;  Surgeon: Daneil Dolin, MD;  Location: [facility];  Service: Endoscopy;  Laterality: N/A;   TUBAL LIGATION      OB History     Gravida  1   Para  1    Term      Preterm  1   AB      Living         SAB      IAB      Ectopic      Multiple      Live Births              Social History   Socioeconomic History   Marital status: Single    Spouse name: Not on file   Number of children: 1   Years of education: Not on file   Highest education level: Not on file  Occupational History   Occupation: disabled    Comment: "spinal issues"  Tobacco Use   Smoking status: Every Day    Packs/day: 2.50    Years: 23.00    Total pack years: 57.50    Types: Cigarettes   Smokeless tobacco: Never   Tobacco comments:    one pack a day  Vaping Use   Vaping Use: Never used  Substance and Sexual Activity   Alcohol use: No   Drug use: No   Sexual activity: Yes    Birth control/protection: Surgical  Other Topics Concern   Not on file  Social History Narrative   Lives w/ grandmother & daughter   Social Determinants of Health   Financial Resource Strain: Unknown (06/01/2022)   Overall Financial Resource Strain (CARDIA)    Difficulty of Paying Living Expenses: Patient refused  Food Insecurity: Unknown (06/01/2022)  Hunger Vital Sign    Worried About Running Out of Food in the Last Year: Patient refused    [place] in the Last Year: Patient refused  Transportation Needs: No Transportation Needs (06/01/2022)   PRAPARE - Hydrologist (Medical): No    Lack of Transportation (Non-Medical): No  Physical Activity: Unknown (06/01/2022)   Exercise Vital Sign    Days of Exercise per Week: Patient refused    Minutes of Exercise per Session: Patient refused  Stress: No Stress Concern Present (06/01/2022)   [place]    Feeling of Stress : Only a little  Social Connections: Moderately Isolated (06/01/2022)   Social Connection and Isolation Panel [NHANES]    Frequency of Communication with Friends and Family: More than three times  a week    Frequency of Social Gatherings with Friends and Family: More than three times a week    Attends Religious Services: 1 to 4 times per year    Active Member of [employer] or Organizations: No    Attends Music therapist: Never    Marital Status: Never married    Family History  Problem Relation Age of Onset   Colon cancer Maternal Grandmother    Cancer Maternal Grandmother        Breast Cancer   Breast cancer Maternal Grandmother    Hypertension Maternal Grandmother    Diabetes Maternal Grandmother    Heart disease Maternal Grandmother    Asthma Mother    Colon cancer Maternal Uncle      Current Outpatient Medications:    omeprazole (PRILOSEC) 20 MG capsule, Take 20 mg by mouth daily., Disp: , Rfl:    AIMOVIG 140 MG/ML SOAJ, Inject 140 mg into the skin every 28 (twenty-eight) days. (Patient not taking: Reported on 06/01/2022), Disp: , Rfl:    EPINEPHrine 0.3 mg/0.3 mL IJ SOAJ injection, Inject 0.3 mg into the muscle as needed for anaphylaxis. (Patient not taking: Reported on 06/01/2022), Disp: , Rfl:    pantoprazole (PROTONIX) 40 MG tablet, Take 40 mg by mouth 2 (two) times daily. (Patient not taking: Reported on 06/01/2022), Disp: , Rfl:    predniSONE (DELTASONE) 20 MG tablet, Take 20 mg by mouth daily with breakfast. (Patient not taking: Reported on 06/01/2022), Disp: , Rfl:   Review of Systems  Review of Systems  Constitutional: Negative for fever, chills, weight loss, malaise/fatigue and diaphoresis.  HENT: Negative for hearing loss, ear pain, nosebleeds, congestion, sore throat, neck pain, tinnitus and ear discharge.   Eyes: Negative for blurred vision, double vision, photophobia, pain, discharge and redness.  Respiratory: Negative for cough, hemoptysis, sputum production, shortness of breath, wheezing and stridor.   Cardiovascular: Negative for chest pain, palpitations, orthopnea, claudication, leg swelling and PND.  Gastrointestinal: negative for abdominal  pain. Negative for heartburn, nausea, vomiting, diarrhea, constipation, blood in stool and melena.  Genitourinary: Negative for dysuria, urgency, frequency, hematuria and flank pain.  Musculoskeletal: Negative for myalgias, back pain, joint pain and falls.  Skin: Negative for itching and rash.  Neurological: Negative for dizziness, tingling, tremors, sensory change, speech change, focal weakness, seizures, loss of consciousness, weakness and headaches.  Endo/Heme/Allergies: Negative for environmental allergies and polydipsia. Does not bruise/bleed easily.  Psychiatric/Behavioral: Negative for depression, suicidal ideas, hallucinations, memory loss and substance abuse. The patient is not nervous/anxious and does not have insomnia.        Objective:  Blood pressure 119/82, pulse Marland Kitchen)  57, height '5\' 4"'$  (1.626 m), weight 178 lb (80.7 kg).   Physical Exam  Vitals reviewed. Constitutional: She is oriented to person, place, and time. She appears well-developed and well-nourished.  HENT:  Head: Normocephalic and atraumatic.        Right Ear: External ear normal.  Left Ear: External ear normal.  Nose: Nose normal.  Mouth/Throat: Oropharynx is clear and moist.  Eyes: Conjunctivae and EOM are normal. Pupils are equal, round, and reactive to light. Right eye exhibits no discharge. Left eye exhibits no discharge. No scleral icterus.  Neck: Normal range of motion. Neck supple. No tracheal deviation present. No thyromegaly present.  Cardiovascular: Normal rate, regular rhythm, normal heart sounds and intact distal pulses.  Exam reveals no gallop and no friction rub.   No murmur heard. Respiratory: Effort normal and breath sounds normal. No respiratory distress. She has no wheezes. She has no rales. She exhibits no tenderness.  GI: Soft. Bowel sounds are normal. She exhibits no distension and no mass. There is no tenderness. There is no rebound and no guarding.  Genitourinary:  Breasts no masses skin  changes or nipple changes bilaterally      Vulva is normal without lesions Vagina is pink moist without discharge Cervix normal in appearance and pap is done Uterus is normal size shape and contour Adnexa is negative with normal sized ovaries  {Rectal    hemoccult negative, normal tone, no masses  Musculoskeletal: Normal range of motion. She exhibits no edema and no tenderness.  Neurological: She is alert and oriented to person, place, and time. She has normal reflexes. She displays normal reflexes. No cranial nerve deficit. She exhibits normal muscle tone. Coordination normal.  Skin: Skin is warm and dry. No rash noted. No erythema. No pallor.  Psychiatric: She has a normal mood and affect. Her behavior is normal. Judgment and thought content normal.       Medications Ordered at today's visit: No orders of the defined types were placed in this encounter.   Other orders placed at today's visit: No orders of the defined types were placed in this encounter.     Assessment:    Normal Gyn exam.    Plan:    Contraception: post menopausal status. Follow up in: 3 years.     Return in about 3 years (around 06/01/2025) for yearly.

## 2022-06-07 LAB — CYTOLOGY - PAP
Chlamydia: NEGATIVE
Comment: NEGATIVE
Comment: NEGATIVE
Comment: NORMAL
Diagnosis: NEGATIVE
High risk HPV: NEGATIVE
Neisseria Gonorrhea: NEGATIVE

## 2022-06-22 ENCOUNTER — Ambulatory Visit: Payer: Medicaid Other | Admitting: Gastroenterology

## 2022-08-19 ENCOUNTER — Ambulatory Visit: Payer: Medicaid Other | Admitting: Gastroenterology

## 2022-09-14 ENCOUNTER — Ambulatory Visit: Payer: Medicaid Other | Admitting: Gastroenterology

## 2022-09-27 NOTE — Progress Notes (Unsigned)
GI Office Note    Referring Provider: Carrolyn Meiers* Primary Care Physician:  Carrolyn Meiers, MD Primary Gastroenterologist: Cristopher Estimable.Rourk, MD  Date:  09/28/2022  ID:  Alyssa Aguilar, DOB 1970/11/04, MRN [id number]   Chief Complaint   Chief Complaint  Patient presents with   Hemorrhoids    Patient here today due to issues with painful hemorrhoids. Patient says she has seen bright red blood at times.    History of Present Illness  Alyssa Aguilar is a 52 y.o. female with a history of hemorrhoids with banding in 2018 x 2 sessions.  Also had banding of all 3 hemorrhoid columns in 2020/2021 presenting today with complaint of hemorrhoids and rectal bleeding.  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 pylori.  Also with empiric dilation for dysphagia.  Last office visit 12/16/2021.  Needed surveillance EGD arranged.  Was having chronic migraine headaches.  To decrease Goody powder use to just as needed.  Denied any abdominal pain, dysphagia, constipation, diarrhea.  Taking pantoprazole once daily.  Denies any GI bleeding.  Advised again to stop using all NSAIDs.  Advised to continue daily PPI.  Scheduled for EGD.  EGD 01/18/2022: -Normal esophagus -Gastric erythema, previously noted ulcer was completely healed -Normal duodenum -Advised to avoid all NSAIDs -Decrease PPI to once daily  Today:  Rectal bleeding: Has been picking up her grand baby and after she may go to the bathroom and wipe and she will have some bright red blood on the toilet tissue or on the outside of the stool. Has some occasional itching. Denies rectal pain. Happens here and there, not with each BM.   Has a BM about once per week and has to strain and sometimes they are hard and sometimes soft. No abdominal pain. States she has tried Linzess in the past.  Denies any melena.   Current Outpatient Medications  Medication Sig  Dispense Refill   AIMOVIG 140 MG/ML SOAJ Inject 140 mg into the skin every 28 (twenty-eight) days.     EPINEPHrine 0.3 mg/0.3 mL IJ SOAJ injection Inject 0.3 mg into the muscle as needed for anaphylaxis.     omeprazole (PRILOSEC) 20 MG capsule Take 20 mg by mouth daily.     No current facility-administered medications for this visit.    Past Medical History:  Diagnosis Date   GERD (gastroesophageal reflux disease)    Helicobacter pylori gastritis 2011   s/p treatment with Prevpac. ERADICATION DOCUMENTED VIA BREATH TEST   Hemorrhoids    IBS (irritable bowel syndrome)    Migraines    S/P colonoscopy March 2011   internal hemorrhoids   S/P endoscopy March 2011   antral erosions, chronic active gastritis, +H.pylori   Spina bifida occulta     Past Surgical History:  Procedure Laterality Date   CHOLECYSTECTOMY     COLONOSCOPY N/A 05/21/2015   [id number] canal hemorrhoids otherwise normal   COLONOSCOPY N/A 08/26/2021   two 3-5 mm polyps in descending colon. Tubular adenomas.   DILATION AND CURETTAGE OF UTERUS     egd/tcs  09/2009   Rourk-H.Pylori gastritis (s/p tx), benign sb bx, small hh, hemorrhoids, normal TI   ENDOMETRIAL ABLATION     ESOPHAGOGASTRODUODENOSCOPY N/A 08/26/2021   small hiatal hernia present. Normal esophagus. One non-bleeding cratered gastric ulcer, 4 mm, clean-based, benign appearing. Empirically dilated. Negative H.pylori.   ESOPHAGOGASTRODUODENOSCOPY (EGD) WITH PROPOFOL N/A 01/18/2022   Procedure: ESOPHAGOGASTRODUODENOSCOPY (EGD) WITH PROPOFOL;  Surgeon: Daneil Dolin, MD;  [facility];  Service: Endoscopy;  Laterality: N/A;  12:15pm   HEMORRHOID BANDING     MALONEY DILATION N/A 08/26/2021   Procedure: MALONEY DILATION;  Surgeon: Daneil Dolin, MD;  Location: [facility];  Service: Endoscopy;  Laterality: N/A;   TUBAL LIGATION      Family History  Problem Relation Age of Onset   Colon cancer Maternal Grandmother    Cancer Maternal  Grandmother        Breast Cancer   Breast cancer Maternal Grandmother    Hypertension Maternal Grandmother    Diabetes Maternal Grandmother    Heart disease Maternal Grandmother    Asthma Mother    Colon cancer Maternal Uncle     Allergies as of 09/28/2022 - Review Complete 09/28/2022  Allergen Reaction Noted   Bee venom Anaphylaxis 11/06/2011   Tylenol with codeine #3 [acetaminophen-codeine] Swelling 08/25/2021    Social History   Socioeconomic History   Marital status: Single    Spouse name: Not on file   Number of children: 1   Years of education: Not on file   Highest education level: Not on file  Occupational History   Occupation: disabled    Comment: "spinal issues"  Tobacco Use   Smoking status: Every Day    Packs/day: 2.50    Years: 23.00    Total pack years: 57.50    Types: Cigarettes   Smokeless tobacco: Never   Tobacco comments:    one pack a day  Vaping Use   Vaping Use: Never used  Substance and Sexual Activity   Alcohol use: No   Drug use: No   Sexual activity: Yes    Birth control/protection: Surgical  Other Topics Concern   Not on file  Social History Narrative   Lives w/ grandmother & daughter   Social Determinants of Health   Financial Resource Strain: Unknown (06/01/2022)   Overall Financial Resource Strain (CARDIA)    Difficulty of Paying Living Expenses: Patient refused  Food Insecurity: Unknown (06/01/2022)   Hunger Vital Sign    Worried About Running Out of Food in the Last Year: Patient refused    [place] in the Last Year: Patient refused  Transportation Needs: No Transportation Needs (06/01/2022)   PRAPARE - Hydrologist (Medical): No    Lack of Transportation (Non-Medical): No  Physical Activity: Unknown (06/01/2022)   Exercise Vital Sign    Days of Exercise per Week: Patient refused    Minutes of Exercise per Session: Patient refused  Stress: No Stress Concern Present (06/01/2022)    [place]    Feeling of Stress : Only a little  Social Connections: Moderately Isolated (06/01/2022)   Social Connection and Isolation Panel [NHANES]    Frequency of Communication with Friends and Family: More than three times a week    Frequency of Social Gatherings with Friends and Family: More than three times a week    Attends Religious Services: 1 to 4 times per year    Active Member of [employer] or Organizations: No    Attends Archivist Meetings: Never    Marital Status: Never married     Review of Systems   Gen: Denies fever, chills, anorexia. Denies fatigue, weakness, weight loss.  CV: Denies chest pain, palpitations, syncope, peripheral edema, and claudication. Resp: Denies dyspnea at rest, cough, wheezing, coughing up blood, and pleurisy.  GI: See HPI Derm: Denies rash, itching, dry skin Psych: Denies depression, anxiety, memory loss, confusion. No homicidal or suicidal ideation.  Heme: Denies bruising, bleeding, and enlarged lymph nodes.   Physical Exam   BP 117/84 (BP Location: Left Arm, Patient Position: Sitting, Cuff Size: Large)   Pulse 73   Temp (!) 97.5 F (36.4 C) (Temporal)   Ht '5\' 4"'$  (1.626 m)   Wt 174 lb 8 oz (79.2 kg)   BMI 29.95 kg/m   General:   Alert and oriented. No distress noted. Pleasant and cooperative.  Head:  Normocephalic and atraumatic. Eyes:  Conjuctiva clear without scleral icterus. Mouth:  Oral mucosa pink and moist. Good dentition. No lesions. Lungs:  Clear to auscultation bilaterally. No wheezes, rales, or rhonchi. No distress.  Heart:  S1, S2 present without murmurs appreciated.  Abdomen:  +BS, soft, non-tender and non-distended. No rebound or guarding. No HSM or masses noted. Rectal: patient declined. Will reassess at follow up.  Msk:  Symmetrical without gross deformities. Normal posture. Extremities:  Without edema. Neurologic:  Alert and  oriented  x4 Psych:  Alert and cooperative. Normal mood and affect.   Assessment  Alyssa Aguilar is a 52 y.o. female with a history of hemorrhoid banding x 2 in 2018 and hemorrhoid banding x 3 in 2020/2021, GERD, IBS, migraines presenting today with hemorrhoids with concern for rectal bleeding.  Hemorrhoids, rectal bleeding,Constipation: Ongoing history of hemorrhoids with rectal bleeding occurring mostly on toilet tissue.  Does not occur with every bowel movement.  Does admit to some constipation as well which is likely exacerbating her hemorrhoid symptoms.  She is underwent banding x 2 in 2018 and x 3 in 2020/2021.  Does admit to lifting up her granddaughter that weighs 30-40 pounds frequently.  Denies any Rehman.  The time the commode but does report some straining with her constipation with only about 1 bowel movement weekly.  No rectal pain or itching.  Will trial Anusol twice daily for 7 days and then as needed.  Patient would like to undergo hemorrhoid banding again.  Will schedule her for follow-up in 2-3 weeks and perform banding if needed.  Will provide samples of Linzess and referred by micrograms daily to assist with better control of constipation.  Will send a prescription if helpful.  Reinforced hemorrhoid precautions today.  PLAN   Linzess 145 mcg daily, samples provided.  Call with progress report in 2-3 weeks. Anusol BID for 7 days and then as needed.  Hemorrhoid precautions discussed. Reassess in 2-3 weeks and will discuss whether or not repeat banding is beneficial.     Venetia Night, MSN, FNP-BC, AGACNP-BC [facility]

## 2022-09-28 ENCOUNTER — Encounter: Payer: Self-pay | Admitting: Gastroenterology

## 2022-09-28 ENCOUNTER — Ambulatory Visit (INDEPENDENT_AMBULATORY_CARE_PROVIDER_SITE_OTHER): Payer: Medicaid Other | Admitting: Gastroenterology

## 2022-09-28 VITALS — BP 117/84 | HR 73 | Temp 97.5°F | Ht 64.0 in | Wt 174.5 lb

## 2022-09-28 DIAGNOSIS — K625 Hemorrhage of anus and rectum: Secondary | ICD-10-CM

## 2022-09-28 DIAGNOSIS — K5900 Constipation, unspecified: Secondary | ICD-10-CM

## 2022-09-28 DIAGNOSIS — K649 Unspecified hemorrhoids: Secondary | ICD-10-CM | POA: Diagnosis not present

## 2022-09-28 MED ORDER — HYDROCORTISONE (PERIANAL) 2.5 % EX CREA: 1.0000 | TOPICAL_CREAM | Freq: Two times a day (BID) | CUTANEOUS | 1 refills | Status: DC

## 2022-09-28 NOTE — Patient Instructions (Signed)
I have sent in Anusol for use twice daily for 1 week.  After this you may use as needed.  You can also use over-the-counter sitz bath or Tucks pads to help with hemorrhoids.  I am providing with some samples of Linzess.  I want you to take 1 tablet once daily, 30 minutes prior to breakfast.  Please let me know how you are doing in a couple of weeks.  If you have good results we will send a prescription, if not we may can trial a higher dose if needed.  We will plan to follow-up in 2-3 weeks.  It was a pleasure to see you today. I want to create trusting relationships with patients. If you receive a survey regarding your visit,  I greatly appreciate you taking time to fill this out on paper or through your MyChart. I value your feedback.  Venetia Night, MSN, FNP-BC, AGACNP-BC [facility]

## 2022-10-14 ENCOUNTER — Encounter: Payer: Medicaid Other | Admitting: Gastroenterology

## 2022-10-25 NOTE — Progress Notes (Unsigned)
   [place] Banding Procedure Note:   Alyssa Aguilar is a 52 y.o. female presenting today for consideration of hemorrhoid banding. Last colonoscopy January 2023 with no mention of hemorrhoids. History of banding in 2018 x2  with re treatment in 2020/2021 x3.  Interval History: Has not yet tried the Linzess. Still having some itching,bleeding, and pain at times in the rectal area with BM's. Primarily having itching. Has occasional bleeding.   The patient presents with symptomatic grade 1/2 hemorrhoids, unresponsive to maximal medical therapy, requesting rubber band ligation of his/her hemorrhoidal disease. All risks, benefits, and alternative forms of therapy were described and informed consent was obtained.  DRE with mild amount of hard tiny stool balls present. Good tone. External hemorrhoid present.   Anoscopy not performed given on DRE there is a fair amount of stool present. Will consider anoscopy at next visit.   The decision was made to band the right posterior internal hemorrhoid (attempt made to band left lateral but unable to get good suction), and the [place] [facility] was used to perform band ligation without complication. Digital anorectal examination was then performed to assure proper positioning of the band, and to adjust the banded tissue as required. The patient was discharged home without pain or other issues. Dietary and behavioral recommendations were given and (if necessary prescriptions were given), along with follow-up instructions. The patient will return in 2-3 weeks for followup and possible additional banding as required.  No complications were encountered and the patient tolerated the procedure well.   Venetia Night, MSN, FNP-BC, AGACNP-BC [facility]

## 2022-10-26 ENCOUNTER — Ambulatory Visit (INDEPENDENT_AMBULATORY_CARE_PROVIDER_SITE_OTHER): Payer: Medicaid Other | Admitting: Gastroenterology

## 2022-10-26 ENCOUNTER — Encounter: Payer: Self-pay | Admitting: Gastroenterology

## 2022-10-26 VITALS — BP 110/78 | HR 76 | Temp 97.3°F | Ht 64.0 in | Wt 173.4 lb

## 2022-10-26 DIAGNOSIS — K641 Second degree hemorrhoids: Secondary | ICD-10-CM | POA: Diagnosis not present

## 2022-10-26 DIAGNOSIS — K640 First degree hemorrhoids: Secondary | ICD-10-CM | POA: Diagnosis not present

## 2022-10-26 NOTE — Patient Instructions (Signed)
Continue to avoid straining. Start your Linzess today or tomorrow if able to have better bowel movements.   Limit toilet time to 2-3 minutes at the most.   Avoid constipation. Take 2 tablespoons of natural wheat bran, natural oat bran, flax, Benefiber or any over the counter fiber supplement and increase your water intake to 7-8 glasses daily.  Occasionally, you may have more bleeding than usual after the banding procedure. This is often from the untreated hemorrhoids rather than the treated one. Don't be concerned if there is a tablespoon or so of blood. If there is more blood than this, lie flat with your bottom higher than your head and apply an ice pack to the area. If the bleeding does not stop within a half an hour or if you feel faint, have severe pain, chills, fever or difficulty passing urine (very rare) or other problems, you should call us at [phone] or report to the nearest emergency room.call our office at [phone] or go to the emergency room.  Please call me with any concerns!  The procedure you have had should have been relatively painless since the banding of the area involved does not have nerve endings and there is no pain sensation. The rubber band cuts off the blood supply to the hemorrhoid and the band may fall off as soon as 48 hours after the banding (the band may occasionally be seen in the toilet bowl following a bowel movement). You may notice a temporary feeling of fullness in the rectum which should respond adequately to plain Tylenol or Motrin.  I will see you back in 2-3 weeks follow-up and/or for additional banding.   Venetia Night, MSN, FNP-BC, AGACNP-BC [facility]

## 2022-11-16 ENCOUNTER — Ambulatory Visit (INDEPENDENT_AMBULATORY_CARE_PROVIDER_SITE_OTHER): Payer: 59 | Admitting: Gastroenterology

## 2022-11-16 ENCOUNTER — Encounter: Payer: Self-pay | Admitting: Gastroenterology

## 2022-11-16 VITALS — BP 112/78 | HR 97 | Temp 97.1°F | Ht 64.0 in | Wt 178.6 lb

## 2022-11-16 DIAGNOSIS — K640 First degree hemorrhoids: Secondary | ICD-10-CM | POA: Diagnosis not present

## 2022-11-16 DIAGNOSIS — K641 Second degree hemorrhoids: Secondary | ICD-10-CM | POA: Diagnosis not present

## 2022-11-16 MED ORDER — HYDROCORTISONE (PERIANAL) 2.5 % EX CREA: 1.0000 | TOPICAL_CREAM | Freq: Two times a day (BID) | CUTANEOUS | 1 refills | Status: DC

## 2022-11-16 NOTE — Patient Instructions (Signed)
Continue to avoid straining. Continue Linzess.   Limit toilet time to 2-3 minutes at the most.   I will send in refill of Anusol for you for the rectal itching.   Avoid constipation. Take 2 tablespoons of natural wheat bran, natural oat bran, flax, Benefiber or any over the counter fiber supplement and increase your water intake to 7-8 glasses daily.  Occasionally, you may have more bleeding than usual after the banding procedure. This is often from the untreated hemorrhoids rather than the treated one. Don't be concerned if there is a tablespoon or so of blood. If there is more blood than this, lie flat with your bottom higher than your head and apply an ice pack to the area. If the bleeding does not stop within a half an hour or if you feel faint, have severe pain, chills, fever or difficulty passing urine (very rare) or other problems, you should call us at [phone] or report to the nearest emergency room.call our office at [phone] or go to the emergency room.  Please call me with any concerns!  The procedure you have had should have been relatively painless since the banding of the area involved does not have nerve endings and there is no pain sensation. The rubber band cuts off the blood supply to the hemorrhoid and the band may fall off as soon as 48 hours after the banding (the band may occasionally be seen in the toilet bowl following a bowel movement). You may notice a temporary feeling of fullness in the rectum which should respond adequately to plain Tylenol or Motrin.  I will see you back in 2-3 weeks for follow-up and/or for additional banding.   Brooke Bonito, MSN, FNP-BC, AGACNP-BC [facility]

## 2022-11-16 NOTE — Progress Notes (Signed)
   [facility] Banding Procedure Note:   Alyssa Aguilar is a 52 y.o. female presenting today for consideration of hemorrhoid banding. Last colonoscopy January 2023 with no mention of hemorrhoids. History of banding in 2018 x2 with re treatment in 2020/2021 x3.   Interval History: Still having some itching but has been tacking the Linzess and having better bowel movements.   The patient presents with symptomatic grade 1/2 hemorrhoids, unresponsive to maximal medical therapy, requesting rubber band ligation of his/her hemorrhoidal disease. All risks, benefits, and alternative forms of therapy were described and informed consent was obtained.  No presence of stool today in the rectal vault.   The decision was made to band the right anterior internal hemorrhoid, and the [facility] System was used to perform band ligation without complication. Digital anorectal examination was then performed to assure proper positioning of the band, and to adjust the banded tissue as required. The patient was discharged home without pain or other issues. Dietary and behavioral recommendations were given and (if necessary prescriptions were given), along with follow-up instructions. The patient will return in 2-3 weeks for followup and possible additional banding as required.  No complications were encountered and the patient tolerated the procedure well.   Brooke Bonito, MSN, FNP-BC, AGACNP-BC [facility]

## 2022-11-27 LAB — AMB RESULTS CONSOLE CBG: Glucose: 127

## 2022-11-27 NOTE — Progress Notes (Signed)
Food resources information given

## 2022-12-06 NOTE — Progress Notes (Unsigned)
   [facility] Banding Procedure Note:   Alyssa Aguilar is a 52 y.o. female presenting today for consideration of hemorrhoid banding. Last colonoscopy in January 2023 without mention of hemorrhoids.  History of hemorrhoid banding x 2 in 2018, x 3 in 2020/2021.  Latex Allergy: yes/no***  Interval History: ***  The patient presents with symptomatic grade *** hemorrhoids, unresponsive to maximal medical therapy, requesting rubber band ligation of his/her hemorrhoidal disease. All risks, benefits, and alternative forms of therapy were described and informed consent was obtained.  In the left lateral decubitus position (if anoscopy is performed) anoscopic examination revealed grade *** hemorrhoids in the *** position (s).  The decision was made to band the left lateral internal hemorrhoid, and the [facility] System was used to perform band ligation without complication. Digital anorectal examination was then performed to assure proper positioning of the band, and to adjust the banded tissue as required. The patient was discharged home without pain or other issues. Dietary and behavioral recommendations were given and (if necessary prescriptions were given), along with follow-up instructions. The patient will return in *** for followup and possible additional banding as required.  No complications were encountered and the patient tolerated the procedure well.    Brooke Bonito, MSN, FNP-BC, AGACNP-BC [facility]

## 2022-12-07 ENCOUNTER — Ambulatory Visit (INDEPENDENT_AMBULATORY_CARE_PROVIDER_SITE_OTHER): Payer: 59 | Admitting: Gastroenterology

## 2022-12-07 ENCOUNTER — Encounter: Payer: Self-pay | Admitting: Gastroenterology

## 2022-12-07 VITALS — BP 96/68 | HR 74 | Temp 98.0°F | Ht 64.0 in | Wt 176.0 lb

## 2022-12-07 DIAGNOSIS — K640 First degree hemorrhoids: Secondary | ICD-10-CM

## 2022-12-07 NOTE — Patient Instructions (Signed)
Continue to avoid straining.  Continue taking Linzess.  Limit toilet time to 2-3 minutes at the most.   Avoid constipation. Take 2 tablespoons of natural wheat bran, natural oat bran, flax, Benefiber or any over the counter fiber supplement and increase your water intake to 7-8 glasses daily.  Occasionally, you may have more bleeding than usual after the banding procedure. This is often from the untreated hemorrhoids rather than the treated one. Don't be concerned if there is a tablespoon or so of blood. If there is more blood than this, lie flat with your bottom higher than your head and apply an ice pack to the area. If the bleeding does not stop within a half an hour or if you feel faint, have severe pain, chills, fever or difficulty passing urine (very rare) or other problems, you should call us at [phone] or report to the nearest emergency room. Please call me with any concerns!  The procedure you have had should have been relatively painless since the banding of the area involved does not have nerve endings and there is no pain sensation. The rubber band cuts off the blood supply to the hemorrhoid and the band may fall off as soon as 48 hours after the banding (the band may occasionally be seen in the toilet bowl following a bowel movement). You may notice a temporary feeling of fullness in the rectum which should respond adequately to plain Tylenol or Motrin.  I will see you back in 3-4 weeks for follow-up.   Brooke Bonito, MSN, FNP-BC, AGACNP-BC [facility]

## 2022-12-08 ENCOUNTER — Encounter: Payer: Self-pay | Admitting: *Deleted

## 2022-12-08 NOTE — Progress Notes (Signed)
Pt attended a screening event on 11/27/22 in [place], where screening results were BP 106/87 and Glucose 127. Pt confirmed Dr. Avon Gully, as her PCP, at [facility] and did indicated food insecurities but Patient was provided with food resources information at the event. Per Chart review, Pt had ongoing Gastrology appointment. On 12/07/22, Pt had Gastro procedure visit and during the visit Pt BP was 96/68. Patient also have upcoming Gastro appointment on 01/04/2023. [facility] for PCP status. During the call, Pt informed the caller that she has access to care through Dr.Fanta, whose documentation does not appear visible in [facility]. When the caller asked Pt if is there any other thing they can help her with. Pt replied with question about her diabetes. The caller inform Pt that her Glucose at the screening result was wnl but if other doctor inform her that her blood sugar was elevated that she should inform her PCP for further care. During the conversation, the caller had harder time understanding the Pt because Pt spoke in softer tone. Caller adjusted the volume but still was hard to understand. The caller did get the info needed but just in case missed anything else caller want to note for further clarification.

## 2023-01-04 ENCOUNTER — Encounter: Payer: Self-pay | Admitting: Gastroenterology

## 2023-01-04 ENCOUNTER — Ambulatory Visit (INDEPENDENT_AMBULATORY_CARE_PROVIDER_SITE_OTHER): Payer: 59 | Admitting: Gastroenterology

## 2023-01-04 VITALS — BP 102/63 | HR 82 | Temp 95.9°F | Ht 64.0 in | Wt 175.9 lb

## 2023-01-04 DIAGNOSIS — K640 First degree hemorrhoids: Secondary | ICD-10-CM

## 2023-01-04 DIAGNOSIS — K5900 Constipation, unspecified: Secondary | ICD-10-CM

## 2023-01-04 DIAGNOSIS — K219 Gastro-esophageal reflux disease without esophagitis: Secondary | ICD-10-CM

## 2023-01-04 MED ORDER — LINACLOTIDE 145 MCG PO CAPS: 145.0000 ug | ORAL_CAPSULE | Freq: Every day | ORAL | 5 refills | Status: DC

## 2023-01-04 NOTE — Patient Instructions (Signed)
Continue Linzess under 45 mcg daily or as needed.  I have sent in prescription to the pharmacy for you.  Please let me know if this is too expensive.  Continue omeprazole 20 mg once daily.  If you have any return of rectal bleeding please contact the office.  I will plan to see you in 6 months, sooner if needed.  It was a pleasure to see you today. I want to create trusting relationships with patients. If you receive a survey regarding your visit,  I greatly appreciate you taking time to fill this out on paper or through your MyChart. I value your feedback.  Brooke Bonito, MSN, FNP-BC, AGACNP-BC [facility]

## 2023-01-04 NOTE — Progress Notes (Signed)
GI Office Note    Referring Provider: Benetta Aguilar* Primary Care Physician:  Alyssa Spar, MD Primary Gastroenterologist: Gerrit Friends.Rourk, MD  Date:  01/04/2023  ID:  Alyssa Aguilar, DOB 1971-06-13, MRN [id number]   Chief Complaint   Chief Complaint  Patient presents with   Follow-up    Patient here today for a follow up on her hemorrhoids. She has had three bandings and is doing well today, patient denies any current issues with hemorrhoids or pain nor sight of blood. Patient says she is taking omeprazole and it has helped with her GERD symptoms.   History of Present Illness  Alyssa Aguilar is a 51 y.o. female with a history of hemorrhoids s/p multiple banding sessions in 2018, 2020 08/2020, and most recently in 2024, constipation, and reflux presenting today for follow-up.  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 pylori.  Also with empiric dilation for dysphagia.   Last office visit 12/16/2021.  Needed surveillance EGD arranged.  Was having chronic migraine headaches.  To decrease Goody powder use to just as needed.  Denied any abdominal pain, dysphagia, constipation, diarrhea.  Taking pantoprazole once daily.  Denies any GI bleeding.  Advised again to stop using all NSAIDs.  Advised to continue daily PPI.  Scheduled for EGD.   EGD 01/18/2022: -Normal esophagus -Gastric erythema, previously noted ulcer was completely healed -Normal duodenum -Advised to avoid all NSAIDs -Decrease PPI to once daily  Today: Has not had any rectal bleeding.  Not currently using any hemorrhoid cream.  Denies any itching or burning.  Denies any hard stools or need to strain.  She is having a bowel movement almost every day and is usually soft.  Has had good improvement with Linzess as needed.  I have previously provided some samples to her.  We will send in a prescription now.  Current Outpatient  Medications  Medication Sig Dispense Refill   AIMOVIG 140 MG/ML SOAJ Inject 140 mg into the skin every 28 (twenty-eight) days.     EPINEPHrine 0.3 mg/0.3 mL IJ SOAJ injection Inject 0.3 mg into the muscle as needed for anaphylaxis.     hydrocortisone (ANUSOL-HC) 2.5 % rectal cream Place 1 Application rectally 2 (two) times daily. For one week and then as needed. 30 g 1   omeprazole (PRILOSEC) 20 MG capsule Take 20 mg by mouth daily.     No current facility-administered medications for this visit.    Past Medical History:  Diagnosis Date   GERD (gastroesophageal reflux disease)    Helicobacter pylori gastritis 2011   s/p treatment with Prevpac. ERADICATION DOCUMENTED VIA BREATH TEST   Hemorrhoids    IBS (irritable bowel syndrome)    Migraines    S/P colonoscopy March 2011   internal hemorrhoids   S/P endoscopy March 2011   antral erosions, chronic active gastritis, +H.pylori   Spina bifida occulta     Past Surgical History:  Procedure Laterality Date   CHOLECYSTECTOMY     COLONOSCOPY N/A 05/21/2015   WUJ:WJXB canal hemorrhoids otherwise normal   COLONOSCOPY N/A 08/26/2021   two 3-5 mm polyps in descending colon. Tubular adenomas.   DILATION AND CURETTAGE OF UTERUS     egd/tcs  09/2009   Rourk-H.Pylori gastritis (s/p tx), benign sb bx, small hh, hemorrhoids, normal TI   ENDOMETRIAL ABLATION     ESOPHAGOGASTRODUODENOSCOPY N/A 08/26/2021   small hiatal hernia present. Normal esophagus. One non-bleeding cratered gastric  ulcer, 4 mm, clean-based, benign appearing. Empirically dilated. Negative H.pylori.   ESOPHAGOGASTRODUODENOSCOPY (EGD) WITH PROPOFOL N/A 01/18/2022   Procedure: ESOPHAGOGASTRODUODENOSCOPY (EGD) WITH PROPOFOL;  Surgeon: Corbin Ade, MD;  [facility];  Service: Endoscopy;  Laterality: N/A;  12:15pm   HEMORRHOID BANDING     MALONEY DILATION N/A 08/26/2021   Procedure: MALONEY DILATION;  Surgeon: Corbin Ade, MD;  Location: [facility];  Service:  Endoscopy;  Laterality: N/A;   TUBAL LIGATION      Family History  Problem Relation Age of Onset   Colon cancer Maternal Grandmother    Cancer Maternal Grandmother        Breast Cancer   Breast cancer Maternal Grandmother    Hypertension Maternal Grandmother    Diabetes Maternal Grandmother    Heart disease Maternal Grandmother    Asthma Mother    Colon cancer Maternal Uncle     Allergies as of 01/04/2023 - Review Complete 01/04/2023  Allergen Reaction Noted   Bee venom Anaphylaxis 11/06/2011   Tylenol with codeine #3 [acetaminophen-codeine] Swelling 08/25/2021    Social History   Socioeconomic History   Marital status: Single    Spouse name: Not on file   Number of children: 1   Years of education: Not on file   Highest education level: Not on file  Occupational History   Occupation: disabled    Comment: "spinal issues"  Tobacco Use   Smoking status: Every Day    Packs/day: 2.50    Years: 23.00    Additional pack years: 0.00    Total pack years: 57.50    Types: Cigarettes    Passive exposure: Current   Smokeless tobacco: Never   Tobacco comments:    one pack a day  Vaping Use   Vaping Use: Never used  Substance and Sexual Activity   Alcohol use: No   Drug use: No   Sexual activity: Yes    Birth control/protection: Surgical  Other Topics Concern   Not on file  Social History Narrative   Lives w/ grandmother & daughter   Social Determinants of Health   Financial Resource Strain: Patient Declined (06/01/2022)   Overall Financial Resource Strain (CARDIA)    Difficulty of Paying Living Expenses: Patient declined  Food Insecurity: Food Insecurity Present (11/27/2022)   Hunger Vital Sign    Worried About Running Out of Food in the Last Year: Often true    Ran Out of Food in the Last Year: Often true  Transportation Needs: No Transportation Needs (11/27/2022)   PRAPARE - Administrator, Civil Service (Medical): No    Lack of Transportation  (Non-Medical): No  Physical Activity: Patient Declined (06/01/2022)   Exercise Vital Sign    Days of Exercise per Week: Patient declined    Minutes of Exercise per Session: Patient declined  Stress: No Stress Concern Present (06/01/2022)   [employer] of Occupational Health - Occupational Stress Questionnaire    Feeling of Stress : Only a little  Social Connections: Moderately Isolated (06/01/2022)   Social Connection and Isolation Panel [NHANES]    Frequency of Communication with Friends and Family: More than three times a week    Frequency of Social Gatherings with Friends and Family: More than three times a week    Attends Religious Services: 1 to 4 times per year    Active Member of [employer] or Organizations: No    Attends Banker Meetings: Never    Marital Status: Never  married     Review of Systems   Gen: Denies fever, chills, anorexia. Denies fatigue, weakness, weight loss.  CV: Denies chest pain, palpitations, syncope, peripheral edema, and claudication. Resp: Denies dyspnea at rest, cough, wheezing, coughing up blood, and pleurisy. GI: See HPI Derm: Denies rash, itching, dry skin Psych: Denies depression, anxiety, memory loss, confusion. No homicidal or suicidal ideation.  Heme: Denies bruising, bleeding, and enlarged lymph nodes.   Physical Exam   BP 102/63 (BP Location: Left Arm, Patient Position: Sitting, Cuff Size: Normal)   Pulse 82   Temp (!) 95.9 F (35.5 C) (Temporal)   Ht 5\' 4"  (1.626 m)   Wt 175 lb 14.4 oz (79.8 kg)   BMI 30.19 kg/m   General:   Alert and oriented. No distress noted. Pleasant and cooperative.  Head:  Normocephalic and atraumatic. Eyes:  Conjuctiva clear without scleral icterus. Mouth:  Oral mucosa pink and moist. Good dentition. No lesions. Abdomen:  +BS, soft, non-tender and non-distended. No rebound or guarding. No HSM or masses noted. Rectal: deferred Msk:  Symmetrical without gross deformities. Normal  posture. Extremities:  Without edema. Neurologic:  Alert and  oriented x4 Psych:  Alert and cooperative. Normal mood and affect.  Assessment  Alyssa Aguilar is a 52 y.o. female with a history of hemorrhoids s/p multiple banding sessions in 2018, 2020 08/2020, and most recently in 2024, constipation, GERD, and H. pylori gastritis s/p treatment with Prevpac and documented eradication presenting today for follow-up.  Hemorrhoids: No longer having to use any hemorrhoid cream.  Recently underwent hemorrhoid banding x 3 and has had good improvement.  No longer having any rectal bleeding.  Constipation: Well-controlled with Linzess as needed.  Also states her heartburn medicine has helped her go to the bathroom more frequently.  Denies need to strain.  Will provide prescription of Linzess.  GERD: Currently well-controlled with omeprazole 20 mg once daily.  PLAN   Continue omeprazole 20 mg once daily Given prescription of Linzess 145 mcg to take once daily.  Previously provided samples. Monitor for any return of rectal bleeding Avoid straining Follow-up in 6 months, sooner if needed.    Brooke Bonito, MSN, FNP-BC, AGACNP-BC [facility]

## 2023-03-03 ENCOUNTER — Ambulatory Visit (INDEPENDENT_AMBULATORY_CARE_PROVIDER_SITE_OTHER): Payer: 59 | Admitting: Gastroenterology

## 2023-03-03 ENCOUNTER — Encounter: Payer: Self-pay | Admitting: Gastroenterology

## 2023-03-03 VITALS — BP 110/79 | HR 73 | Temp 98.6°F | Ht 64.0 in | Wt 175.8 lb

## 2023-03-03 DIAGNOSIS — K640 First degree hemorrhoids: Secondary | ICD-10-CM | POA: Diagnosis not present

## 2023-03-03 DIAGNOSIS — K5900 Constipation, unspecified: Secondary | ICD-10-CM | POA: Diagnosis not present

## 2023-03-03 DIAGNOSIS — L290 Pruritus ani: Secondary | ICD-10-CM

## 2023-03-03 DIAGNOSIS — K219 Gastro-esophageal reflux disease without esophagitis: Secondary | ICD-10-CM | POA: Diagnosis not present

## 2023-03-03 MED ORDER — HYDROCORTISONE (PERIANAL) 2.5 % EX CREA: 1.0000 | TOPICAL_CREAM | Freq: Two times a day (BID) | CUTANEOUS | 1 refills | Status: AC

## 2023-03-03 NOTE — Patient Instructions (Addendum)
Start using Anusol twice daily internally for 7-10 days.    Please pick up an over-the-counter zinc paste (Desitin or Boudreau's Butt paste) to apply externally twice daily for 1 week.  When wiping after bowel movement you may use a baby wipe and then follow that up with regular toilet paper to make sure you are dry.  After showering please air dry and make sure you are fully dry before placing on any clothing as the extra moisture can make itching worse.  Continue taking your Linzess to help control your constipation.  Continue omeprazole 20 mg once daily.  If your symptoms are becoming more frequent or more severe you may increase this to 40 mg once daily.  Please let me know if you need to do this.  Call me with a progress report in 2 weeks to see if the itching is better.  It was a pleasure to see you today. I want to create trusting relationships with patients. If you receive a survey regarding your visit,  I greatly appreciate you taking time to fill this out on paper or through your MyChart. I value your feedback.  Brooke Bonito, MSN, FNP-BC, AGACNP-BC [facility]

## 2023-03-03 NOTE — Progress Notes (Signed)
GI Office Note    Referring Provider: Benetta Spar* Primary Care Physician:  Benetta Spar, MD Primary Gastroenterologist: Gerrit Friends.Rourk, MD  Date:  03/03/2023  ID:  Alyssa Aguilar, DOB 12/19/1970, MRN [id number]   Chief Complaint   Chief Complaint  Patient presents with   Follow-up    Follow up on hemorrhoids, they are itching   History of Present Illness  Alyssa Aguilar is a 52 y.o. female with a history of IBS-constipation, h. Pylori gastritis s/p eradication, GERD, and hemorrhoids s/p banding in 2018, 2020/2021, and most recently x3 (2024) presenting today.   Colonoscopy January 2023: -Two 3-5 mm polyps in descending colon -Pathology revealed tubular adenomas -Repeat in 7 years   EGD January 2023 with nonbleeding cratered gastric ulcer, negative for H. pylori.  Also with empiric dilation for dysphagia.   Office visit 12/16/2021.  Needed surveillance EGD arranged.  Was having chronic migraine headaches.  To decrease Goody powder use to just as needed.  Denied any abdominal pain, dysphagia, constipation, diarrhea.  Taking pantoprazole once daily.  Denies any GI bleeding.  Advised again to stop using all NSAIDs.  Advised to continue daily PPI.  Scheduled for EGD.   EGD 01/18/2022: -Normal esophagus -Gastric erythema, previously noted ulcer was completely healed -Normal duodenum -Advised to avoid all NSAIDs -Decrease PPI to once daily  Office visit 09/28/22.  Patient reported rectal bleeding occurring for some time.  Usually occurs after picking up heavy objects.  Has noticed some blood on outside of stool.  Has occasional itching but denies any rectal pain.  Symptoms not occurring with every bowel movement.  Currently only having 1 BM per week and having to strain with hard stools at times.  Was not currently on anything.  States that she had tried Linzess in the past.  Advised to start Linzess 100 for 5 mg daily.  Use Anusol twice daily for 7 days and as needed.   Consider repeat hemorrhoid banding.  Last visit 01/04/2023 she was not using any hemorrhoid cream and denies any rectal bleeding.  Also was not having any itching or burning.  Denies straining or hard stools.  Having a bowel movement almost daily.  Is taking her Linzess.  Prescription sent in for patient.  Advise continue omeprazole for reflux.  Today: Last couple days she has been itching more in the rectal area.  Noticed it after she got out of the shower on Monday.  Has been having some mild burning with itching but no severe pain.  Denies any rectal bleeding.  Has been going to the bathroom fairly quickly after taking her Linzess in the mornings.  Denies any straining.  Her reflux has been a little worse but manageable.    Current Outpatient Medications  Medication Sig Dispense Refill   AIMOVIG 140 MG/ML SOAJ Inject 140 mg into the skin every 28 (twenty-eight) days.     linaclotide (LINZESS) 145 MCG CAPS capsule Take 1 capsule (145 mcg total) by mouth daily before breakfast. 30 capsule 5   omeprazole (PRILOSEC) 20 MG capsule Take 20 mg by mouth daily.     EPINEPHrine 0.3 mg/0.3 mL IJ SOAJ injection Inject 0.3 mg into the muscle as needed for anaphylaxis. (Patient not taking: Reported on 03/03/2023)     hydrocortisone (ANUSOL-HC) 2.5 % rectal cream Place 1 Application rectally 2 (two) times daily. For one week and then as needed. 30 g 1   No current facility-administered medications for this visit.    Past Medical  History:  Diagnosis Date   GERD (gastroesophageal reflux disease)    Helicobacter pylori gastritis 2011   s/p treatment with Prevpac. ERADICATION DOCUMENTED VIA BREATH TEST   Hemorrhoids    IBS (irritable bowel syndrome)    Migraines    S/P colonoscopy March 2011   internal hemorrhoids   S/P endoscopy March 2011   antral erosions, chronic active gastritis, +H.pylori   Spina bifida occulta     Past Surgical History:  Procedure Laterality Date   CHOLECYSTECTOMY      COLONOSCOPY N/A 05/21/2015   BJY:NWGN canal hemorrhoids otherwise normal   COLONOSCOPY N/A 08/26/2021   two 3-5 mm polyps in descending colon. Tubular adenomas.   DILATION AND CURETTAGE OF UTERUS     egd/tcs  09/2009   Rourk-H.Pylori gastritis (s/p tx), benign sb bx, small hh, hemorrhoids, normal TI   ENDOMETRIAL ABLATION     ESOPHAGOGASTRODUODENOSCOPY N/A 08/26/2021   small hiatal hernia present. Normal esophagus. One non-bleeding cratered gastric ulcer, 4 mm, clean-based, benign appearing. Empirically dilated. Negative H.pylori.   ESOPHAGOGASTRODUODENOSCOPY (EGD) WITH PROPOFOL N/A 01/18/2022   Procedure: ESOPHAGOGASTRODUODENOSCOPY (EGD) WITH PROPOFOL;  Surgeon: Corbin Ade, MD;  [facility]: [facility];  Service: Endoscopy;  Laterality: N/A;  12:15pm   HEMORRHOID BANDING     MALONEY DILATION N/A 08/26/2021   Procedure: MALONEY DILATION;  Surgeon: Corbin Ade, MD;  [facility]: [facility];  Service: Endoscopy;  Laterality: N/A;   TUBAL LIGATION      Family History  Problem Relation Age of Onset   Colon cancer Maternal Grandmother    Cancer Maternal Grandmother        Breast Cancer   Breast cancer Maternal Grandmother    Hypertension Maternal Grandmother    Diabetes Maternal Grandmother    Heart disease Maternal Grandmother    Asthma Mother    Colon cancer Maternal Uncle     Allergies as of 03/03/2023 - Review Complete 03/03/2023  Allergen Reaction Noted   Bee venom Anaphylaxis 11/06/2011   Tylenol with codeine #3 [acetaminophen-codeine] Swelling 08/25/2021    Social History   Socioeconomic History   Marital status: Single    Spouse name: Not on file   Number of children: 1   Years of education: Not on file   Highest education level: Not on file  Occupational History   Occupation: disabled    Comment: "spinal issues"  Tobacco Use   Smoking status: Every Day    Current packs/day: 2.50    Average packs/day: 2.5 packs/day for 23.0 years (57.5 ttl pk-yrs)     Types: Cigarettes    Passive exposure: Current   Smokeless tobacco: Never   Tobacco comments:    one pack a day  Vaping Use   Vaping status: Never Used  Substance and Sexual Activity   Alcohol use: No   Drug use: No   Sexual activity: Yes    Birth control/protection: Surgical  Other Topics Concern   Not on file  Social History Narrative   Lives w/ grandmother & daughter   Social Determinants of Health   Financial Resource Strain: Patient Declined (06/01/2022)   Overall Financial Resource Strain (CARDIA)    Difficulty of Paying Living Expenses: Patient declined  Food Insecurity: Food Insecurity Present (11/27/2022)   Hunger Vital Sign    Worried About Running Out of Food in the Last Year: Often true    Ran Out of Food in the Last Year: Often true  Transportation Needs: No Transportation Needs (11/27/2022)  PRAPARE - Administrator, Civil Service (Medical): No    Lack of Transportation (Non-Medical): No  Physical Activity: Patient Declined (06/01/2022)   Exercise Vital Sign    Days of Exercise per Week: Patient declined    Minutes of Exercise per Session: Patient declined  Stress: No Stress Concern Present (06/01/2022)   [employer] of Occupational Health - Occupational Stress Questionnaire    Feeling of Stress : Only a little  Social Connections: Moderately Isolated (06/01/2022)   Social Connection and Isolation Panel [NHANES]    Frequency of Communication with Friends and Family: More than three times a week    Frequency of Social Gatherings with Friends and Family: More than three times a week    Attends Religious Services: 1 to 4 times per year    Active Member of [employer] or Organizations: No    Attends Banker Meetings: Never    Marital Status: Never married     Review of Systems   Gen: Denies fever, chills, anorexia. Denies fatigue, weakness, weight loss.  CV: Denies chest pain, palpitations, syncope, peripheral edema, and  claudication. Resp: Denies dyspnea at rest, cough, wheezing, coughing up blood, and pleurisy. GI: See HPI Derm: Denies rash, itching, dry skin Psych: Denies depression, anxiety, memory loss, confusion. No homicidal or suicidal ideation.  Heme: Denies bruising, bleeding, and enlarged lymph nodes.   Physical Exam   BP 110/79   Pulse 73   Temp 98.6 F (37 C)   Ht 5\' 4"  (1.626 m)   Wt 175 lb 12.8 oz (79.7 kg)   BMI 30.18 kg/m   General:   Alert and oriented. No distress noted. Pleasant and cooperative.  Head:  Normocephalic and atraumatic. Eyes:  Conjuctiva clear without scleral icterus. Mouth:  Oral mucosa pink and moist. Good dentition. No lesions. Abdomen:  +BS, soft, non-tender and non-distended. No rebound or guarding. No HSM or masses noted. Rectal: Declined by patient. Msk:  Symmetrical without gross deformities. Normal posture. Extremities:  Without edema. Neurologic:  Alert and  oriented x4 Psych:  Alert and cooperative. Normal mood and affect.   Assessment  Alyssa Aguilar is a 52 y.o. female with a history of IBS-constipation, h. Pylori gastritis s/p eradication, GERD, and hemorrhoids s/p banding in 2018, 2020/2021, and most recently x3 (2024) presenting today.   Constipation: Well-controlled with Linzess 145 mcg once daily  Hemorrhoids: History of multiple rounds of hemorrhoid banding.  She received banding x 2 in 2018 and x 3 in 2020/2021.  Recently underwent banding x 3 again (February-April).  Was doing well without any bleeding, itching, or pain up until this past Monday when she began having itching.  Likely secondary to excess moisture in the perianal area.  Currently without any rectal bleeding however she has had some mild burning also with itching.  Rectal exam declined by patient today.  Will trial Anusol twice daily for 7 days and also advised Desitin to perianal area for 1 week.  Reinforced important hygiene measures including staying dry prior to applying  clothing.  GERD: Having some occasional breakthrough symptoms.  Currently maintained on omeprazole 20 mg once daily.  Can increase dose to 40 mg once daily as needed.  Advised patient to let her know if reflux worsens and we can change prescription if needed.   PLAN   Anusol BID for 7-10 days Apply Desitin twice daily to perianal area  Discussed hygiene measures Continue omeprazole 20 mg once daily, can increase to 40 mg once  daily if needed Continue Linzess 145 mcg once daily Call with progress report in 2 weeks Follow-up as needed.     Brooke Bonito, MSN, FNP-BC, AGACNP-BC [facility]

## 2023-05-12 ENCOUNTER — Other Ambulatory Visit: Payer: Self-pay

## 2023-06-03 ENCOUNTER — Ambulatory Visit: Payer: 59 | Admitting: Obstetrics & Gynecology

## 2023-06-06 ENCOUNTER — Encounter: Payer: Self-pay | Admitting: Obstetrics & Gynecology

## 2023-06-06 ENCOUNTER — Other Ambulatory Visit (HOSPITAL_COMMUNITY)
Admission: RE | Admit: 2023-06-06 | Discharge: 2023-06-06 | Disposition: A | Discharge status: Home / Self Care | Payer: MEDICAID | Source: Ambulatory Visit | Attending: Obstetrics & Gynecology | Admitting: Obstetrics & Gynecology

## 2023-06-06 ENCOUNTER — Ambulatory Visit (INDEPENDENT_AMBULATORY_CARE_PROVIDER_SITE_OTHER): Payer: MEDICAID | Admitting: Obstetrics & Gynecology

## 2023-06-06 VITALS — BP 107/75 | HR 63 | Ht 64.0 in | Wt 180.0 lb

## 2023-06-06 DIAGNOSIS — Z113 Encounter for screening for infections with a predominantly sexual mode of transmission: Secondary | ICD-10-CM | POA: Insufficient documentation

## 2023-06-06 DIAGNOSIS — Z01419 Encounter for gynecological examination (general) (routine) without abnormal findings: Secondary | ICD-10-CM | POA: Insufficient documentation

## 2023-06-06 NOTE — Progress Notes (Signed)
Subjective:     Alyssa Aguilar is a 52 y.o. female here for a routine exam.  No LMP recorded. Patient has had an ablation. G1P0100 Birth Control Method:  BTL Menstrual Calendar(currently): none  Current complaints: none.   Current acute medical issues:  none   Recent Gynecologic History No LMP recorded. Patient has had an ablation. Last Pap: 2023,  normal wants to have one every year Last mammogram: 04/2022,  normal  Past Medical History:  Diagnosis Date   GERD (gastroesophageal reflux disease)    Helicobacter pylori gastritis 2011   s/p treatment with Prevpac. ERADICATION DOCUMENTED VIA BREATH TEST   Hemorrhoids    IBS (irritable bowel syndrome)    Migraines    S/P colonoscopy March 2011   internal hemorrhoids   S/P endoscopy March 2011   antral erosions, chronic active gastritis, +H.pylori   Spina bifida occulta     Past Surgical History:  Procedure Laterality Date   CHOLECYSTECTOMY     COLONOSCOPY N/A 05/21/2015   VHQ:IONG canal hemorrhoids otherwise normal   COLONOSCOPY N/A 08/26/2021   two 3-5 mm polyps in descending colon. Tubular adenomas.   DILATION AND CURETTAGE OF UTERUS     egd/tcs  09/2009   Rourk-H.Pylori gastritis (s/p tx), benign sb bx, small hh, hemorrhoids, normal TI   ENDOMETRIAL ABLATION     ESOPHAGOGASTRODUODENOSCOPY N/A 08/26/2021   small hiatal hernia present. Normal esophagus. One non-bleeding cratered gastric ulcer, 4 mm, clean-based, benign appearing. Empirically dilated. Negative H.pylori.   ESOPHAGOGASTRODUODENOSCOPY (EGD) WITH PROPOFOL N/A 01/18/2022   Procedure: ESOPHAGOGASTRODUODENOSCOPY (EGD) WITH PROPOFOL;  Surgeon: Corbin Ade, MD;  Location: [facility];  Service: Endoscopy;  Laterality: N/A;  12:15pm   HEMORRHOID BANDING     MALONEY DILATION N/A 08/26/2021   Procedure: MALONEY DILATION;  Surgeon: Corbin Ade, MD;  Location: [facility];  Service: Endoscopy;  Laterality: N/A;   TUBAL LIGATION      OB History     Gravida  1    Para  1   Term      Preterm  1   AB      Living         SAB      IAB      Ectopic      Multiple      Live Births              Social History   Socioeconomic History   Marital status: Single    Spouse name: Not on file   Number of children: 1   Years of education: Not on file   Highest education level: Not on file  Occupational History   Occupation: disabled    Comment: "spinal issues"  Tobacco Use   Smoking status: Every Day    Current packs/day: 2.50    Average packs/day: 2.5 packs/day for 23.0 years (57.5 ttl pk-yrs)    Types: Cigarettes    Passive exposure: Current   Smokeless tobacco: Never   Tobacco comments:    one pack a day  Vaping Use   Vaping status: Never Used  Substance and Sexual Activity   Alcohol use: No   Drug use: No   Sexual activity: Yes    Birth control/protection: Surgical  Other Topics Concern   Not on file  Social History Narrative   Lives w/ grandmother & daughter   Social Determinants of Health   Financial Resource Strain: Patient Declined (06/06/2023)   Overall Financial Resource Strain (CARDIA)  Difficulty of Paying Living Expenses: Patient declined  Food Insecurity: No Food Insecurity (06/06/2023)   Hunger Vital Sign    Worried About Running Out of Food in the Last Year: Never true    Ran Out of Food in the Last Year: Never true  Transportation Needs: No Transportation Needs (06/06/2023)   PRAPARE - Administrator, Civil Service (Medical): No    Lack of Transportation (Non-Medical): No  Physical Activity: Inactive (06/06/2023)   Exercise Vital Sign    Days of Exercise per Week: 0 days    Minutes of Exercise per Session: 0 min  Stress: No Stress Concern Present (06/06/2023)   [employer] of Occupational Health - Occupational Stress Questionnaire    Feeling of Stress : Not at all  Social Connections: Moderately Isolated (06/06/2023)   Social Connection and Isolation Panel [NHANES]    Frequency  of Communication with Friends and Family: Once a week    Frequency of Social Gatherings with Friends and Family: Twice a week    Attends Religious Services: More than 4 times per year    Active Member of [employer] or Organizations: No    Attends Engineer, structural: Never    Marital Status: Never married    Family History  Problem Relation Age of Onset   Colon cancer Maternal Grandmother    Cancer Maternal Grandmother        Breast Cancer   Breast cancer Maternal Grandmother    Hypertension Maternal Grandmother    Diabetes Maternal Grandmother    Heart disease Maternal Grandmother    Asthma Mother    Colon cancer Maternal Uncle      Current Outpatient Medications:    AIMOVIG 140 MG/ML SOAJ, Inject 140 mg into the skin every 28 (twenty-eight) days., Disp: , Rfl:    hydrocortisone (ANUSOL-HC) 2.5 % rectal cream, Place 1 Application rectally 2 (two) times daily. For one week and then as needed., Disp: 30 g, Rfl: 1   linaclotide (LINZESS) 145 MCG CAPS capsule, Take 1 capsule (145 mcg total) by mouth daily before breakfast., Disp: 30 capsule, Rfl: 5   omeprazole (PRILOSEC) 20 MG capsule, Take 20 mg by mouth daily., Disp: , Rfl:    EPINEPHrine 0.3 mg/0.3 mL IJ SOAJ injection, Inject 0.3 mg into the muscle as needed for anaphylaxis. (Patient not taking: Reported on 03/03/2023), Disp: , Rfl:   Review of Systems  Review of Systems  Constitutional: Negative for fever, chills, weight loss, malaise/fatigue and diaphoresis.  HENT: Negative for hearing loss, ear pain, nosebleeds, congestion, sore throat, neck pain, tinnitus and ear discharge.   Eyes: Negative for blurred vision, double vision, photophobia, pain, discharge and redness.  Respiratory: Negative for cough, hemoptysis, sputum production, shortness of breath, wheezing and stridor.   Cardiovascular: Negative for chest pain, palpitations, orthopnea, claudication, leg swelling and PND.  Gastrointestinal: negative for abdominal pain.  Negative for heartburn, nausea, vomiting, diarrhea, constipation, blood in stool and melena.  Genitourinary: Negative for dysuria, urgency, frequency, hematuria and flank pain.  Musculoskeletal: Negative for myalgias, back pain, joint pain and falls.  Skin: Negative for itching and rash.  Neurological: Negative for dizziness, tingling, tremors, sensory change, speech change, focal weakness, seizures, loss of consciousness, weakness and headaches.  Endo/Heme/Allergies: Negative for environmental allergies and polydipsia. Does not bruise/bleed easily.  Psychiatric/Behavioral: Negative for depression, suicidal ideas, hallucinations, memory loss and substance abuse. The patient is not nervous/anxious and does not have insomnia.        Objective:  Blood pressure 107/75, pulse 63, height 5\' 4"  (1.626 m), weight 180 lb (81.6 kg).   Physical Exam  Vitals reviewed. Constitutional: She is oriented to person, place, and time. She appears well-developed and well-nourished.  HENT:  Head: Normocephalic and atraumatic.        Right Ear: External ear normal.  Left Ear: External ear normal.  Nose: Nose normal.  Mouth/Throat: Oropharynx is clear and moist.  Eyes: Conjunctivae and EOM are normal. Pupils are equal, round, and reactive to light. Right eye exhibits no discharge. Left eye exhibits no discharge. No scleral icterus.  Neck: Normal range of motion. Neck supple. No tracheal deviation present. No thyromegaly present.  Cardiovascular: Normal rate, regular rhythm, normal heart sounds and intact distal pulses.  Exam reveals no gallop and no friction rub.   No murmur heard. Respiratory: Effort normal and breath sounds normal. No respiratory distress. She has no wheezes. She has no rales. She exhibits no tenderness.  GI: Soft. Bowel sounds are normal. She exhibits no distension and no mass. There is no tenderness. There is no rebound and no guarding.  Genitourinary:  Breasts no masses skin changes or  nipple changes bilaterally      Vulva is normal without lesions Vagina is pink moist without discharge Cervix normal in appearance and pap is done Uterus is normal size shape and contour Adnexa is negative with normal sized ovaries   Musculoskeletal: Normal range of motion. She exhibits no edema and no tenderness.  Neurological: She is alert and oriented to person, place, and time. She has normal reflexes. She displays normal reflexes. No cranial nerve deficit. She exhibits normal muscle tone. Coordination normal.  Skin: Skin is warm and dry. No rash noted. No erythema. No pallor.  Psychiatric: She has a normal mood and affect. Her behavior is normal. Judgment and thought content normal.       Medications Ordered at today's visit: No orders of the defined types were placed in this encounter.   Other orders placed at today's visit: No orders of the defined types were placed in this encounter.     Assessment:    Normal Gyn exam.    Plan:    Contraception: tubal ligation. Follow up in: 1 year.     No follow-ups on file.

## 2023-06-13 LAB — CYTOLOGY - PAP
Adequacy: ABSENT
Chlamydia: NEGATIVE
Comment: NEGATIVE
Comment: NEGATIVE
Comment: NEGATIVE
Comment: NORMAL
Diagnosis: UNDETERMINED — AB
High risk HPV: NEGATIVE
Neisseria Gonorrhea: NEGATIVE
Trichomonas: NEGATIVE

## 2023-07-06 ENCOUNTER — Ambulatory Visit: Payer: MEDICAID | Admitting: Gastroenterology

## 2023-07-06 DIAGNOSIS — Z5321 Procedure and treatment not carried out due to patient leaving prior to being seen by health care provider: Secondary | ICD-10-CM

## 2023-07-06 NOTE — Progress Notes (Unsigned)
GI Office Note    Referring Provider: Benetta Spar* Primary Care Physician:  Benetta Spar, MD Primary Gastroenterologist: Gerrit Friends.Rourk, MD  Date:  07/06/2023  ID:  Alyssa Aguilar, DOB [birth year], MRN [id number]   Chief Complaint   No chief complaint on file.  History of Present Illness  Alyssa Aguilar is a 52 y.o. female with a history of IBS-C, H. pylori gastritis s/p eradication, GERD, and hemorrhoids s/p banding in 2018, 2021, and 2024 presenting today for follow-up.  Colonoscopy January 2023: -Two 3-5 mm polyps in descending colon -Pathology revealed tubular adenomas -Repeat in 7 years   EGD January 2023 with nonbleeding cratered gastric ulcer, negative for H. pylori.  Also with empiric dilation for dysphagia.   Office visit 12/16/2021.  Needed surveillance EGD arranged.  Was having chronic migraine headaches.  To decrease Goody powder use to just as needed.  Denied any abdominal pain, dysphagia, constipation, diarrhea.  Taking pantoprazole once daily.  Denies any GI bleeding.  Advised again to stop using all NSAIDs.  Advised to continue daily PPI.  Scheduled for EGD.   EGD 01/18/2022: -Normal esophagus -Gastric erythema, previously noted ulcer was completely healed -Normal duodenum -Advised to avoid all NSAIDs -Decrease PPI to once daily   OV  01/04/2023 she was not using any hemorrhoid cream and denied any rectal bleeding.  Also was not having any itching or burning.  Denies straining or hard stools.  Having a bowel movement almost daily.  Is taking her Linzess.  Prescription sent in for patient.  Advise continue omeprazole for reflux.  Last office visit 03/03/2023.  Had been having some more itching in the rectal area especially after getting out of the shower.  Also with some mild burning but no severe pain.  She denied any rectal bleeding.  Had been going fairly quickly after taking her Linzess in the mornings and denied any straining.  Had had some mild  worsening acid reflux but reported that it was manageable.  Given her Anusol twice daily for 7-10 days and advised her to apply Desitin to her perianal area and discussed hygiene measures.  Advised her that we could increase her omeprazole to 40 mg once daily if needed but for now we will continue 20 mg daily.  Also advised continue her Linzess and call with progress report in 2 weeks.    Today:    Wt Readings from Last 3 Encounters:  06/06/23 180 lb (81.6 kg)  03/03/23 175 lb 12.8 oz (79.7 kg)  01/04/23 175 lb 14.4 oz (79.8 kg)    Current Outpatient Medications  Medication Sig Dispense Refill   AIMOVIG 140 MG/ML SOAJ Inject 140 mg into the skin every 28 (twenty-eight) days.     EPINEPHrine 0.3 mg/0.3 mL IJ SOAJ injection Inject 0.3 mg into the muscle as needed for anaphylaxis. (Patient not taking: Reported on 03/03/2023)     hydrocortisone (ANUSOL-HC) 2.5 % rectal cream Place 1 Application rectally 2 (two) times daily. For one week and then as needed. 30 g 1   linaclotide (LINZESS) 145 MCG CAPS capsule Take 1 capsule (145 mcg total) by mouth daily before breakfast. 30 capsule 5   omeprazole (PRILOSEC) 20 MG capsule Take 20 mg by mouth daily.     No current facility-administered medications for this visit.    Past Medical History:  Diagnosis Date   GERD (gastroesophageal reflux disease)    Helicobacter pylori gastritis 2011   s/p treatment with Prevpac. ERADICATION DOCUMENTED VIA BREATH TEST  Hemorrhoids    IBS (irritable bowel syndrome)    Migraines    S/P colonoscopy March 2011   internal hemorrhoids   S/P endoscopy March 2011   antral erosions, chronic active gastritis, +H.pylori   Spina bifida occulta     Past Surgical History:  Procedure Laterality Date   CHOLECYSTECTOMY     COLONOSCOPY N/A 05/21/2015   ZOX:WRUE canal hemorrhoids otherwise normal   COLONOSCOPY N/A 08/26/2021   two 3-5 mm polyps in descending colon. Tubular adenomas.   DILATION AND CURETTAGE OF  UTERUS     egd/tcs  09/2009   Rourk-H.Pylori gastritis (s/p tx), benign sb bx, small hh, hemorrhoids, normal TI   ENDOMETRIAL ABLATION     ESOPHAGOGASTRODUODENOSCOPY N/A 08/26/2021   small hiatal hernia present. Normal esophagus. One non-bleeding cratered gastric ulcer, 4 mm, clean-based, benign appearing. Empirically dilated. Negative H.pylori.   ESOPHAGOGASTRODUODENOSCOPY (EGD) WITH PROPOFOL N/A 01/18/2022   Procedure: ESOPHAGOGASTRODUODENOSCOPY (EGD) WITH PROPOFOL;  Surgeon: Corbin Ade, MD;  Location: [facility];  Service: Endoscopy;  Laterality: N/A;  12:15pm   HEMORRHOID BANDING     MALONEY DILATION N/A 08/26/2021   Procedure: MALONEY DILATION;  Surgeon: Corbin Ade, MD;  Location: [facility];  Service: Endoscopy;  Laterality: N/A;   TUBAL LIGATION      Family History  Problem Relation Age of Onset   Colon cancer Maternal Grandmother    Cancer Maternal Grandmother        Breast Cancer   Breast cancer Maternal Grandmother    Hypertension Maternal Grandmother    Diabetes Maternal Grandmother    Heart disease Maternal Grandmother    Asthma Mother    Colon cancer Maternal Uncle     Allergies as of 07/06/2023 - Review Complete 06/06/2023  Allergen Reaction Noted   Bee venom Anaphylaxis 11/06/2011   Tylenol with codeine #3 [acetaminophen-codeine] Swelling 08/25/2021    Social History   Socioeconomic History   Marital status: Single    Spouse name: Not on file   Number of children: 1   Years of education: Not on file   Highest education level: Not on file  Occupational History   Occupation: disabled    Comment: "spinal issues"  Tobacco Use   Smoking status: Every Day    Current packs/day: 2.50    Average packs/day: 2.5 packs/day for 23.0 years (57.5 ttl pk-yrs)    Types: Cigarettes    Passive exposure: Current   Smokeless tobacco: Never   Tobacco comments:    one pack a day  Vaping Use   Vaping status: Never Used  Substance and Sexual Activity    Alcohol use: No   Drug use: No   Sexual activity: Yes    Birth control/protection: Surgical  Other Topics Concern   Not on file  Social History Narrative   Lives w/ grandmother & daughter   Social Determinants of Health   Financial Resource Strain: Patient Declined (06/06/2023)   Overall Financial Resource Strain (CARDIA)    Difficulty of Paying Living Expenses: Patient declined  Food Insecurity: No Food Insecurity (06/06/2023)   Hunger Vital Sign    Worried About Running Out of Food in the Last Year: Never true    Ran Out of Food in the Last Year: Never true  Transportation Needs: No Transportation Needs (06/06/2023)   PRAPARE - Administrator, Civil Service (Medical): No    Lack of Transportation (Non-Medical): No  Physical Activity: Inactive (06/06/2023)   Exercise Vital Sign  Days of Exercise per Week: 0 days    Minutes of Exercise per Session: 0 min  Stress: No Stress Concern Present (06/06/2023)   [employer] of Occupational Health - Occupational Stress Questionnaire    Feeling of Stress : Not at all  Social Connections: Moderately Isolated (06/06/2023)   Social Connection and Isolation Panel [NHANES]    Frequency of Communication with Friends and Family: Once a week    Frequency of Social Gatherings with Friends and Family: Twice a week    Attends Religious Services: More than 4 times per year    Active Member of [employer] or Organizations: No    Attends Banker Meetings: Never    Marital Status: Never married     Review of Systems   Gen: Denies fever, chills, anorexia. Denies fatigue, weakness, weight loss.  CV: Denies chest pain, palpitations, syncope, peripheral edema, and claudication. Resp: Denies dyspnea at rest, cough, wheezing, coughing up blood, and pleurisy. GI: See HPI Derm: Denies rash, itching, dry skin Psych: Denies depression, anxiety, memory loss, confusion. No homicidal or suicidal ideation.  Heme: Denies bruising,  bleeding, and enlarged lymph nodes.  Physical Exam   There were no vitals taken for this visit.  General:   Alert and oriented. No distress noted. Pleasant and cooperative.  Head:  Normocephalic and atraumatic. Eyes:  Conjuctiva clear without scleral icterus. Mouth:  Oral mucosa pink and moist. Good dentition. No lesions. Lungs:  Clear to auscultation bilaterally. No wheezes, rales, or rhonchi. No distress.  Heart:  S1, S2 present without murmurs appreciated.  Abdomen:  +BS, soft, non-tender and non-distended. No rebound or guarding. No HSM or masses noted. Rectal: *** Msk:  Symmetrical without gross deformities. Normal posture. Extremities:  Without edema. Neurologic:  Alert and  oriented x4 Psych:  Alert and cooperative. Normal mood and affect.  Assessment  Alyssa Aguilar is a 52 y.o. female with a history of *** presenting today with   Hemorrhoids:  Constipation:  GERD  PLAN   ***     Brooke Bonito, MSN, FNP-BC, AGACNP-BC [facility]

## 2023-07-12 NOTE — Progress Notes (Signed)
GI Office Note    Referring Provider: Benetta Spar* Primary Care Physician:  Benetta Spar, MD Primary Gastroenterologist: Gerrit Friends.Rourk, MD  Date:  07/19/2023  ID:  Alyssa Aguilar, DOB 04-26-1971, MRN [id number]   Chief Complaint   Chief Complaint  Patient presents with   Follow-up    Follow up. No problems     History of Present Illness  Alyssa Aguilar is a 52 y.o. female with a history of  IBS-constipation, h. Pylori gastritis s/p eradication, GERD, and hemorrhoids s/p banding in 2018, 2020/2021, and most recently x3 (2024) presenting today for follow up.    Colonoscopy January 2023: -Two 3-5 mm polyps in descending colon -Pathology revealed tubular adenomas -Repeat in 7 years   EGD January 2023 with nonbleeding cratered gastric ulcer, negative for H. pylori.  Also with empiric dilation for dysphagia.   Office visit 12/16/2021.  Needed surveillance EGD arranged.  Was having chronic migraine headaches.  To decrease Goody powder use to just as needed.  Denied any abdominal pain, dysphagia, constipation, diarrhea.  Taking pantoprazole once daily.  Denies any GI bleeding.  Advised again to stop using all NSAIDs.  Advised to continue daily PPI.  Scheduled for EGD.   EGD 01/18/2022: -Normal esophagus -Gastric erythema, previously noted ulcer was completely healed -Normal duodenum -Advised to avoid all NSAIDs -Decrease PPI to once daily   OV 09/28/22.  Patient reported rectal bleeding occurring for some time.  Usually occurs after picking up heavy objects.  Has noticed some blood on outside of stool.  Has occasional itching but denies any rectal pain.  Symptoms not occurring with every bowel movement.  Currently only having 1 BM per week and having to strain with hard stools at times.  Was not currently on anything.  States that she had tried Linzess in the past.  Advised to start Linzess 100 for 5 mg daily.  Use Anusol twice daily for 7 days and as needed.  Consider  repeat hemorrhoid banding.   OV  01/04/2023 she was not using any hemorrhoid cream and denies any rectal bleeding.  Also was not having any itching or burning.  Denies straining or hard stools.  Having a bowel movement almost daily.  Is taking her Linzess.  Prescription sent in for patient.  Advise continue omeprazole for reflux.   Last office visit 03/03/23.  Having occasional reflux breakthrough symptoms but maintained on omeprazole 20 mg once daily.  Have been having more itching in the rectal area and noticed after she got in the shower on Monday.  Also having some mild burning with itching but no severe pain.  Denies any rectal bleeding.  Reported fairly quick bowel movements after taking Linzess.  No straining. Advised Anusol twice daily for 7 to 10 days.  Apply Desitin twice daily to perianal area and discussed hygiene measures.  Continue omeprazole 20 mg once daily, can increase to 40 mg once daily if needed.  Advised to continue Linzess 145 mcg once daily.   Today:  Does not have to strain when she takes Linzess. If she does not take Linzess then she will have to strain.  Usually has at least a daily bowel movement with Linzess.  Denies any abdominal pain currently.  She reports no current issues with her hemorrhoids.  Denies any itching, burning, or pain.  Denies any melena, BRBPR, lack of appetite, early satiety, weight loss, nausea, vomiting, dysphagia.  Reflux controlled with omeprazole.  Wt Readings from Last 3 Encounters:  07/19/23  179 lb (81.2 kg)  06/06/23 180 lb (81.6 kg)  03/03/23 175 lb 12.8 oz (79.7 kg)    Current Outpatient Medications  Medication Sig Dispense Refill   AIMOVIG 140 MG/ML SOAJ Inject 140 mg into the skin every 28 (twenty-eight) days.     hydrocortisone (ANUSOL-HC) 2.5 % rectal cream Place 1 Application rectally 2 (two) times daily. For one week and then as needed. 30 g 1   linaclotide (LINZESS) 145 MCG CAPS capsule Take 1 capsule (145 mcg total) by mouth  daily before breakfast. 30 capsule 5   omeprazole (PRILOSEC) 20 MG capsule Take 20 mg by mouth daily.     EPINEPHrine 0.3 mg/0.3 mL IJ SOAJ injection Inject 0.3 mg into the muscle as needed for anaphylaxis. (Patient not taking: Reported on 03/03/2023)     No current facility-administered medications for this visit.    Past Medical History:  Diagnosis Date   GERD (gastroesophageal reflux disease)    Helicobacter pylori gastritis 2011   s/p treatment with Prevpac. ERADICATION DOCUMENTED VIA BREATH TEST   Hemorrhoids    IBS (irritable bowel syndrome)    Migraines    S/P colonoscopy March 2011   internal hemorrhoids   S/P endoscopy March 2011   antral erosions, chronic active gastritis, +H.pylori   Spina bifida occulta     Past Surgical History:  Procedure Laterality Date   CHOLECYSTECTOMY     COLONOSCOPY N/A 05/21/2015   NWG:NFAO canal hemorrhoids otherwise normal   COLONOSCOPY N/A 08/26/2021   two 3-5 mm polyps in descending colon. Tubular adenomas.   DILATION AND CURETTAGE OF UTERUS     egd/tcs  09/2009   Rourk-H.Pylori gastritis (s/p tx), benign sb bx, small hh, hemorrhoids, normal TI   ENDOMETRIAL ABLATION     ESOPHAGOGASTRODUODENOSCOPY N/A 08/26/2021   small hiatal hernia present. Normal esophagus. One non-bleeding cratered gastric ulcer, 4 mm, clean-based, benign appearing. Empirically dilated. Negative H.pylori.   ESOPHAGOGASTRODUODENOSCOPY (EGD) WITH PROPOFOL N/A 01/18/2022   Procedure: ESOPHAGOGASTRODUODENOSCOPY (EGD) WITH PROPOFOL;  Surgeon: Corbin Ade, MD;  [facility]: [facility];  Service: Endoscopy;  Laterality: N/A;  12:15pm   HEMORRHOID BANDING     MALONEY DILATION N/A 08/26/2021   Procedure: MALONEY DILATION;  Surgeon: Corbin Ade, MD;  Location: [facility];  Service: Endoscopy;  Laterality: N/A;   TUBAL LIGATION      Family History  Problem Relation Age of Onset   Colon cancer Maternal Grandmother    Cancer Maternal Grandmother        Breast  Cancer   Breast cancer Maternal Grandmother    Hypertension Maternal Grandmother    Diabetes Maternal Grandmother    Heart disease Maternal Grandmother    Asthma Mother    Colon cancer Maternal Uncle     Allergies as of 07/19/2023 - Review Complete 07/19/2023  Allergen Reaction Noted   Bee venom Anaphylaxis 11/06/2011   Tylenol with codeine #3 [acetaminophen-codeine] Swelling 08/25/2021    Social History   Socioeconomic History   Marital status: Single    Spouse name: Not on file   Number of children: 1   Years of education: Not on file   Highest education level: Not on file  Occupational History   Occupation: disabled    Comment: "spinal issues"  Tobacco Use   Smoking status: Every Day    Current packs/day: 2.50    Average packs/day: 2.5 packs/day for 23.0 years (57.5 ttl pk-yrs)    Types: Cigarettes    Passive exposure: Current  Smokeless tobacco: Never   Tobacco comments:    one pack a day  Vaping Use   Vaping status: Never Used  Substance and Sexual Activity   Alcohol use: No   Drug use: No   Sexual activity: Yes    Birth control/protection: Surgical  Other Topics Concern   Not on file  Social History Narrative   Lives w/ grandmother & daughter   Social Drivers of Health   Financial Resource Strain: Patient Declined (06/06/2023)   Overall Financial Resource Strain (CARDIA)    Difficulty of Paying Living Expenses: Patient declined  Food Insecurity: No Food Insecurity (06/06/2023)   Hunger Vital Sign    Worried About Running Out of Food in the Last Year: Never true    Ran Out of Food in the Last Year: Never true  Transportation Needs: No Transportation Needs (06/06/2023)   PRAPARE - Administrator, Civil Service (Medical): No    Lack of Transportation (Non-Medical): No  Physical Activity: Inactive (06/06/2023)   Exercise Vital Sign    Days of Exercise per Week: 0 days    Minutes of Exercise per Session: 0 min  Stress: No Stress Concern Present  (06/06/2023)   [employer] of Occupational Health - Occupational Stress Questionnaire    Feeling of Stress : Not at all  Social Connections: Moderately Isolated (06/06/2023)   Social Connection and Isolation Panel [NHANES]    Frequency of Communication with Friends and Family: Once a week    Frequency of Social Gatherings with Friends and Family: Twice a week    Attends Religious Services: More than 4 times per year    Active Member of [employer] or Organizations: No    Attends Banker Meetings: Never    Marital Status: Never married     Review of Systems   Gen: Denies fever, chills, anorexia. Denies fatigue, weakness, weight loss.  CV: Denies chest pain, palpitations, syncope, peripheral edema, and claudication. Resp: Denies dyspnea at rest, cough, wheezing, coughing up blood, and pleurisy. GI: See HPI Derm: Denies rash, itching, dry skin Psych: Denies depression, anxiety, memory loss, confusion. No homicidal or suicidal ideation.  Heme: Denies bruising, bleeding, and enlarged lymph nodes.  Physical Exam   BP 108/74 (BP Location: Right Arm, Patient Position: Sitting, Cuff Size: Large)   Pulse 76   Temp 98 F (36.7 C) (Temporal)   Ht 5\' 4"  (1.626 m)   Wt 179 lb (81.2 kg)   BMI 30.73 kg/m   General:   Alert and oriented. No distress noted. Pleasant and cooperative.  Head:  Normocephalic and atraumatic. Eyes:  Conjuctiva clear without scleral icterus. Mouth:  Oral mucosa pink and moist. Good dentition. No lesions. Lungs:  Clear to auscultation bilaterally. No wheezes, rales, or rhonchi. No distress.  Heart:  S1, S2 present without murmurs appreciated.  Abdomen:  +BS, soft, non-tender and non-distended. No rebound or guarding. No HSM or masses noted. Rectal: deferred Msk:  Symmetrical without gross deformities. Normal posture. Extremities:  Without edema. Neurologic:  Alert and  oriented x4 Psych:  Alert and cooperative. Normal mood and affect.  Assessment   Alyssa Aguilar is a 52 y.o. female with a history of IBS-constipation, h. Pylori gastritis s/p eradication, GERD, and hemorrhoids s/p banding in 2018, 2020/2021, and most recently x3 (2024) presenting today for follow up.   Constipation: Usually fairly well-controlled with Linzess.  Currently out of medication.  Will send in refill to pharmacy today and provide some additional samples.  Hemorrhoids: No current issues.  Advise she can continue to use Anusol as needed for flares.  GERD: Well-controlled with omeprazole 20 mg once daily.  No recent breakthrough symptoms.  Denies any nausea, vomiting, or dysphagia.  PLAN   Continue omeprazole 20 mg once daily. GERD diet Continue Linzess and refer 5 mcg once daily.  Refill sent to pharmacy today. Continue Anusol as needed Follow-up 6 months    Brooke Bonito, MSN, FNP-BC, AGACNP-BC [facility]

## 2023-07-19 ENCOUNTER — Encounter: Payer: Self-pay | Admitting: Gastroenterology

## 2023-07-19 ENCOUNTER — Ambulatory Visit (INDEPENDENT_AMBULATORY_CARE_PROVIDER_SITE_OTHER): Payer: MEDICAID | Admitting: Gastroenterology

## 2023-07-19 VITALS — BP 108/74 | HR 76 | Temp 98.0°F | Ht 64.0 in | Wt 179.0 lb

## 2023-07-19 DIAGNOSIS — K219 Gastro-esophageal reflux disease without esophagitis: Secondary | ICD-10-CM | POA: Diagnosis not present

## 2023-07-19 DIAGNOSIS — K649 Unspecified hemorrhoids: Secondary | ICD-10-CM | POA: Diagnosis not present

## 2023-07-19 DIAGNOSIS — K5900 Constipation, unspecified: Secondary | ICD-10-CM | POA: Diagnosis not present

## 2023-07-19 DIAGNOSIS — K640 First degree hemorrhoids: Secondary | ICD-10-CM

## 2023-07-19 MED ORDER — LINACLOTIDE 145 MCG PO CAPS: 145.0000 ug | ORAL_CAPSULE | Freq: Every day | ORAL | 5 refills | Status: AC

## 2023-07-19 NOTE — Patient Instructions (Signed)
Continue Linzess 145 mcg once daily.  Continue omeprazole 20 mg once daily.   If you have any return of itching with your hemorrhoids you can continue using the Anusol cream.  We will follow-up in 6 months.  Sooner if needed.  Please let me know if there is any issues getting your medication.  I hope you have a Alt[place] Christmas and a happy new year!  It was a pleasure to see you today. I want to create trusting relationships with patients. If you receive a survey regarding your visit,  I greatly appreciate you taking time to fill this out on paper or through your MyChart. I value your feedback.  Brooke Bonito, MSN, FNP-BC, AGACNP-BC [facility]

## 2023-12-20 ENCOUNTER — Encounter: Payer: Self-pay | Admitting: Gastroenterology

## 2024-02-12 NOTE — Progress Notes (Deleted)
 GI Office Note    Referring Provider: Carlette Benita Area* Primary Care Physician:  Fanta, Tesfaye Demissie, MD Primary Gastroenterologist: Lamar HERO.Rourk, MD  Date:  02/12/2024  ID:  Alyssa Aguilar, DOB 05/02/1971, MRN [id number]  Chief Complaint   No chief complaint on file.  History of Present Illness  Alyssa Aguilar is a 53 y.o. female with a history of *** presenting today with complaint of   Colonoscopy January 2023: -Two 3-5 mm polyps in descending colon -Pathology revealed tubular adenomas -Repeat in 7 years   EGD January 2023 with nonbleeding cratered gastric ulcer, negative for H. pylori.  Also with empiric dilation for dysphagia.   Office visit 12/16/2021.  Needed surveillance EGD arranged.  Was having chronic migraine headaches.  To decrease Goody powder use to just as needed.  Denied any abdominal pain, dysphagia, constipation, diarrhea.  Taking pantoprazole once daily.  Denies any GI bleeding.  Advised again to stop using all NSAIDs.  Advised to continue daily PPI.  Scheduled for EGD.   EGD 01/18/2022: -Normal esophagus -Gastric erythema, previously noted ulcer was completely healed -Normal duodenum -Advised to avoid all NSAIDs -Decrease PPI to once daily   OV 09/28/22.  Patient reported rectal bleeding occurring for some time.  Usually occurs after picking up heavy objects.  Has noticed some blood on outside of stool.  Has occasional itching but denies any rectal pain.  Symptoms not occurring with every bowel movement.  Currently only having 1 BM per week and having to strain with hard stools at times.  Was not currently on anything.  States that she had tried Linzess  in the past.  Advised to start Linzess  100 for 5 mg daily.  Use Anusol  twice daily for 7 days and as needed.  Consider repeat hemorrhoid banding.   OV  01/04/2023 she was not using any hemorrhoid cream and denies any rectal bleeding.  Also was not having any itching or burning.  Denies straining or hard  stools.  Having a bowel movement almost daily.  Is taking her Linzess .  Prescription sent in for patient.  Advise continue omeprazole  for reflux.   OV  03/03/23.  Having occasional reflux breakthrough symptoms but maintained on omeprazole  20 mg once daily.  Have been having more itching in the rectal area and noticed after she got in the shower on Monday.  Also having some mild burning with itching but no severe pain.  Denies any rectal bleeding.  Reported fairly quick bowel movements after taking Linzess .  No straining. Advised Anusol  twice daily for 7 to 10 days.  Apply Desitin twice daily to perianal area and discussed hygiene measures.  Continue omeprazole  20 mg once daily, can increase to 40 mg once daily if needed.  Advised to continue Linzess  145 mcg once daily.  Last office visit 07/19/23.***. Continue omeprazole  20 mg daily. GERD diet. Linzess  145 mcg daily. Continue anusol  as needed.    Today:    Wt Readings from Last 5 Encounters:  07/19/23 179 lb (81.2 kg)  06/06/23 180 lb (81.6 kg)  03/03/23 175 lb 12.8 oz (79.7 kg)  01/04/23 175 lb 14.4 oz (79.8 kg)  12/07/22 176 lb (79.8 kg)    Current Outpatient Medications  Medication Sig Dispense Refill   AIMOVIG 140 MG/ML SOAJ Inject 140 mg into the skin every 28 (twenty-eight) days.     EPINEPHrine 0.3 mg/0.3 mL IJ SOAJ injection Inject 0.3 mg into the muscle as needed for anaphylaxis. (Patient not taking: Reported on 03/03/2023)  hydrocortisone  (ANUSOL -HC) 2.5 % rectal cream Place 1 Application rectally 2 (two) times daily. For one week and then as needed. 30 g 1   linaclotide  (LINZESS ) 145 MCG CAPS capsule Take 1 capsule (145 mcg total) by mouth daily before breakfast. 30 capsule 5   omeprazole  (PRILOSEC) 20 MG capsule Take 20 mg by mouth daily.     No current facility-administered medications for this visit.    Past Medical History:  Diagnosis Date   GERD (gastroesophageal reflux disease)    Helicobacter pylori gastritis 2011    s/p treatment with Prevpac. ERADICATION DOCUMENTED VIA BREATH TEST   Hemorrhoids    IBS (irritable bowel syndrome)    Migraines    S/P colonoscopy March 2011   internal hemorrhoids   S/P endoscopy March 2011   antral erosions, chronic active gastritis, +H.pylori   Spina bifida occulta     Past Surgical History:  Procedure Laterality Date   CHOLECYSTECTOMY     COLONOSCOPY N/A 05/21/2015   MFM:jwjo canal hemorrhoids otherwise normal   COLONOSCOPY N/A 08/26/2021   two 3-5 mm polyps in descending colon. Tubular adenomas.   DILATION AND CURETTAGE OF UTERUS     egd/tcs  09/2009   Rourk-H.Pylori gastritis (s/p tx), benign sb bx, small hh, hemorrhoids, normal TI   ENDOMETRIAL ABLATION     ESOPHAGOGASTRODUODENOSCOPY N/A 08/26/2021   small hiatal hernia present. Normal esophagus. One non-bleeding cratered gastric ulcer, 4 mm, clean-based, benign appearing. Empirically dilated. Negative H.pylori.   ESOPHAGOGASTRODUODENOSCOPY (EGD) WITH PROPOFOL  N/A 01/18/2022   Procedure: ESOPHAGOGASTRODUODENOSCOPY (EGD) WITH PROPOFOL ;  Surgeon: Shaaron Lamar HERO, MD;  [facility]: [facility];  Service: Endoscopy;  Laterality: N/A;  12:15pm   HEMORRHOID BANDING     MALONEY DILATION N/A 08/26/2021   Procedure: MALONEY DILATION;  Surgeon: Shaaron Lamar HERO, MD;  [facility]: [facility];  Service: Endoscopy;  Laterality: N/A;   TUBAL LIGATION      Family History  Problem Relation Age of Onset   Colon cancer Maternal Grandmother    Cancer Maternal Grandmother        Breast Cancer   Breast cancer Maternal Grandmother    Hypertension Maternal Grandmother    Diabetes Maternal Grandmother    Heart disease Maternal Grandmother    Asthma Mother    Colon cancer Maternal Uncle     Allergies as of 02/13/2024 - Review Complete 07/19/2023  Allergen Reaction Noted   Bee venom Anaphylaxis 11/06/2011   Tylenol with codeine #3 [acetaminophen-codeine] Swelling 08/25/2021    Social History   Socioeconomic History    Marital status: Single    Spouse name: Not on file   Number of children: 1   Years of education: Not on file   Highest education level: Not on file  Occupational History   Occupation: disabled    Comment: spinal issues  Tobacco Use   Smoking status: Every Day    Current packs/day: 2.50    Average packs/day: 2.5 packs/day for 23.0 years (57.5 ttl pk-yrs)    Types: Cigarettes    Passive exposure: Current   Smokeless tobacco: Never   Tobacco comments:    one pack a day  Vaping Use   Vaping status: Never Used  Substance and Sexual Activity   Alcohol use: No   Drug use: No   Sexual activity: Yes    Birth control/protection: Surgical  Other Topics Concern   Not on file  Social History Narrative   Lives w/ grandmother & daughter   Social Drivers of  Health   Financial Resource Strain: Patient Declined (06/06/2023)   Overall Financial Resource Strain (CARDIA)    Difficulty of Paying Living Expenses: Patient declined  Food Insecurity: No Food Insecurity (06/06/2023)   Hunger Vital Sign    Worried About Running Out of Food in the Last Year: Never true    Ran Out of Food in the Last Year: Never true  Transportation Needs: No Transportation Needs (06/06/2023)   PRAPARE - Administrator, Civil Service (Medical): No    Lack of Transportation (Non-Medical): No  Physical Activity: Inactive (06/06/2023)   Exercise Vital Sign    Days of Exercise per Week: 0 days    Minutes of Exercise per Session: 0 min  Stress: No Stress Concern Present (06/06/2023)   [employer] of Occupational Health - Occupational Stress Questionnaire    Feeling of Stress : Not at all  Social Connections: Moderately Isolated (06/06/2023)   Social Connection and Isolation Panel    Frequency of Communication with Friends and Family: Once a week    Frequency of Social Gatherings with Friends and Family: Twice a week    Attends Religious Services: More than 4 times per year    Active Member of [employer]  or Organizations: No    Attends Banker Meetings: Never    Marital Status: Never married     Review of Systems   Gen: Denies fever, chills, anorexia. Denies fatigue, weakness, weight loss.  CV: Denies chest pain, palpitations, syncope, peripheral edema, and claudication. Resp: Denies dyspnea at rest, cough, wheezing, coughing up blood, and pleurisy. GI: See HPI Derm: Denies rash, itching, dry skin Psych: Denies depression, anxiety, memory loss, confusion. No homicidal or suicidal ideation.  Heme: Denies bruising, bleeding, and enlarged lymph nodes.  Physical Exam   There were no vitals taken for this visit.  General:   Alert and oriented. No distress noted. Pleasant and cooperative.  Head:  Normocephalic and atraumatic. Eyes:  Conjuctiva clear without scleral icterus. Mouth:  Oral mucosa pink and moist. Good dentition. No lesions. Lungs:  Clear to auscultation bilaterally. No wheezes, rales, or rhonchi. No distress.  Heart:  S1, S2 present without murmurs appreciated.  Abdomen:  +BS, soft, non-tender and non-distended. No rebound or guarding. No HSM or masses noted. Rectal: *** Msk:  Symmetrical without gross deformities. Normal posture. Extremities:  Without edema. Neurologic:  Alert and  oriented x4 Psych:  Alert and cooperative. Normal mood and affect.  Assessment  Kassidy Dockendorf is a 53 y.o. female presenting today with ***  Constipation:  GERD:  Hemorrhoids:   PLAN   *** Continue Omeprazole  *** GERD diet Linzess  145 mcg daily*** Follow up ***    Charmaine Melia, MSN, FNP-BC, AGACNP-BC [facility]

## 2024-02-13 ENCOUNTER — Ambulatory Visit: Payer: MEDICAID | Admitting: Gastroenterology

## 2024-02-20 ENCOUNTER — Encounter: Payer: Self-pay | Admitting: Gastroenterology

## 2024-05-17 ENCOUNTER — Other Ambulatory Visit (HOSPITAL_COMMUNITY): Payer: Self-pay | Admitting: Gerontology

## 2024-05-17 ENCOUNTER — Other Ambulatory Visit (HOSPITAL_COMMUNITY): Payer: Self-pay | Admitting: Internal Medicine

## 2024-05-17 DIAGNOSIS — Z1231 Encounter for screening mammogram for malignant neoplasm of breast: Secondary | ICD-10-CM

## 2024-05-17 DIAGNOSIS — Z87891 Personal history of nicotine dependence: Secondary | ICD-10-CM

## 2024-05-28 ENCOUNTER — Ambulatory Visit (HOSPITAL_COMMUNITY)
Admission: RE | Admit: 2024-05-28 | Discharge: 2024-05-28 | Disposition: A | Discharge status: Home / Self Care | Payer: MEDICAID | Source: Ambulatory Visit | Attending: Internal Medicine | Admitting: Internal Medicine

## 2024-05-28 DIAGNOSIS — Z1231 Encounter for screening mammogram for malignant neoplasm of breast: Secondary | ICD-10-CM | POA: Diagnosis present

## 2024-05-30 ENCOUNTER — Ambulatory Visit (HOSPITAL_COMMUNITY)
Admission: RE | Admit: 2024-05-30 | Discharge: 2024-05-30 | Disposition: A | Discharge status: Home / Self Care | Payer: MEDICAID | Source: Ambulatory Visit | Attending: Gerontology | Admitting: Gerontology

## 2024-05-30 DIAGNOSIS — Z87891 Personal history of nicotine dependence: Secondary | ICD-10-CM | POA: Insufficient documentation

## 2024-06-25 ENCOUNTER — Ambulatory Visit: Payer: MEDICAID | Admitting: Obstetrics & Gynecology

## 2024-09-11 ENCOUNTER — Ambulatory Visit: Payer: MEDICAID | Admitting: Adult Health
